# Patient Record
Sex: Female | Born: 1955
Health system: Southern US, Community
[De-identification: ages and names within clinical notes are randomized; demographics above are authoritative.]

## PROBLEM LIST (undated history)

## (undated) DIAGNOSIS — I10 Essential (primary) hypertension: Secondary | ICD-10-CM

## (undated) DIAGNOSIS — B1920 Unspecified viral hepatitis C without hepatic coma: Secondary | ICD-10-CM

## (undated) DIAGNOSIS — M1990 Unspecified osteoarthritis, unspecified site: Secondary | ICD-10-CM

## (undated) DIAGNOSIS — J45909 Unspecified asthma, uncomplicated: Secondary | ICD-10-CM

## (undated) DIAGNOSIS — Z8489 Family history of other specified conditions: Secondary | ICD-10-CM

## (undated) DIAGNOSIS — F319 Bipolar disorder, unspecified: Secondary | ICD-10-CM

## (undated) DIAGNOSIS — I639 Cerebral infarction, unspecified: Secondary | ICD-10-CM

## (undated) DIAGNOSIS — F1020 Alcohol dependence, uncomplicated: Secondary | ICD-10-CM

## (undated) DIAGNOSIS — Z8639 Personal history of other endocrine, nutritional and metabolic disease: Secondary | ICD-10-CM

## (undated) HISTORY — PX: CHOLECYSTECTOMY: SHX55

## (undated) HISTORY — PX: ABDOMINAL HYSTERECTOMY: SHX81

## (undated) SURGERY — Surgical Case
Anesthesia: *Unknown

---

## 2013-07-05 ENCOUNTER — Emergency Department (HOSPITAL_COMMUNITY)
Admission: EM | Admit: 2013-07-05 | Discharge: 2013-07-05 | Disposition: A | Discharge status: Home / Self Care | Payer: Self-pay | Attending: Emergency Medicine | Admitting: Emergency Medicine

## 2013-07-05 ENCOUNTER — Encounter (HOSPITAL_COMMUNITY): Payer: Self-pay | Admitting: Family Medicine

## 2013-07-05 DIAGNOSIS — F17200 Nicotine dependence, unspecified, uncomplicated: Secondary | ICD-10-CM | POA: Insufficient documentation

## 2013-07-05 DIAGNOSIS — K047 Periapical abscess without sinus: Secondary | ICD-10-CM | POA: Insufficient documentation

## 2013-07-05 DIAGNOSIS — K0381 Cracked tooth: Secondary | ICD-10-CM | POA: Insufficient documentation

## 2013-07-05 DIAGNOSIS — K029 Dental caries, unspecified: Secondary | ICD-10-CM | POA: Insufficient documentation

## 2013-07-05 MED ORDER — OXYCODONE-ACETAMINOPHEN 5-325 MG PO TABS: 1.0000 | ORAL_TABLET | ORAL | Status: DC | PRN

## 2013-07-05 MED ORDER — CLINDAMYCIN HCL 150 MG PO CAPS
300.0000 mg | ORAL_CAPSULE | Freq: Once | ORAL | Status: AC
  Administered 2013-07-05: 300 mg via ORAL
  Filled 2013-07-05: qty 2

## 2013-07-05 MED ORDER — CLINDAMYCIN HCL 150 MG PO CAPS: 300.0000 mg | ORAL_CAPSULE | Freq: Four times a day (QID) | ORAL | Status: DC

## 2013-07-05 MED ORDER — OXYCODONE-ACETAMINOPHEN 5-325 MG PO TABS
1.0000 | ORAL_TABLET | Freq: Once | ORAL | Status: AC
  Administered 2013-07-05: 1 via ORAL
  Filled 2013-07-05: qty 1

## 2013-07-05 NOTE — ED Notes (Signed)
Per pt sts dental pain and facial swelling on the right side since she woke up this am.

## 2013-07-05 NOTE — ED Provider Notes (Signed)
   History    CSN: [id number] Arrival date & time 07/05/13  1416  First MD Initiated Contact with Patient 07/05/13 1429     Chief Complaint  Patient presents with  . Dental Pain   (Consider location/radiation/quality/duration/timing/severity/associated sxs/prior Treatment) HPI Diane Cook is a 57 y.o. female who presents to ED with complaint of a dental pain. States pain started 2 days ago. She chipped that tooth a year ago, however no problems since. States feels like she has right facial swelling. Taking aspirin with no relief. Denies fever, chills, malaise.  History reviewed. No pertinent past medical history. Past Surgical History  Procedure Laterality Date  . Abdominal hysterectomy    . Cholecystectomy     History reviewed. No pertinent family history. History  Substance Use Topics  . Smoking status: Current Every Day Smoker  . Smokeless tobacco: Not on file  . Alcohol Use: No   OB History   Grav Para Term Preterm Abortions TAB SAB Ect Mult Living                 Review of Systems  Constitutional: Negative for fever and chills.  HENT: Positive for dental problem. Negative for ear pain, sore throat, trouble swallowing, neck pain and neck stiffness.   Neurological: Negative for weakness and headaches.    Allergies  Review of patient's allergies indicates no known allergies.  Home Medications  No current outpatient prescriptions on file. BP 149/84  Temp(Src) 98.3 F (36.8 C)  Resp 18  SpO2 100% Physical Exam  Nursing note and vitals reviewed. Constitutional: She appears well-developed and well-nourished. No distress.  HENT:  Head: Normocephalic.  Right lower 1st molar partially chipped off, carries present, surrounding gum abscess. No obvious facial swelling. No swelling under the tongue.   Eyes: Conjunctivae are normal.  Neck: Neck supple.  Musculoskeletal: She exhibits no edema.  Skin: Skin is warm and dry.    ED Course  Procedures (including critical  care time) Labs Reviewed - No data to display No results found.  1. Dental abscess     MDM  Pt with a dental abscess. Will start on clindamycin, percocet for pain. Follow up with oral surgery. No signs of sepsis or ludwigs angina. Pt non toxic.   Filed Vitals:   07/05/13 1427 07/05/13 1447  BP: 149/84 158/89  Pulse:  98  Temp: 98.3 F (36.8 C)   Resp: 18   SpO2: 100% 100%     Lottie Mussel, PA-C 07/06/13 1924

## 2013-07-05 NOTE — ED Notes (Signed)
Right lower molar chipped. Now has pain and swelling.

## 2013-07-11 NOTE — ED Provider Notes (Signed)
Medical screening examination/treatment/procedure(s) were performed by non-physician practitioner and as supervising physician I was immediately available for consultation/collaboration.   Desmund Elman III, MD 07/11/13 1210 

## 2013-11-18 ENCOUNTER — Encounter (HOSPITAL_COMMUNITY): Payer: Self-pay | Admitting: Emergency Medicine

## 2013-11-18 ENCOUNTER — Emergency Department (HOSPITAL_COMMUNITY)
Admission: EM | Admit: 2013-11-18 | Discharge: 2013-11-18 | Disposition: A | Discharge status: Home / Self Care | Payer: Self-pay | Attending: Emergency Medicine | Admitting: Emergency Medicine

## 2013-11-18 DIAGNOSIS — J45909 Unspecified asthma, uncomplicated: Secondary | ICD-10-CM | POA: Insufficient documentation

## 2013-11-18 DIAGNOSIS — Z8739 Personal history of other diseases of the musculoskeletal system and connective tissue: Secondary | ICD-10-CM | POA: Insufficient documentation

## 2013-11-18 DIAGNOSIS — F17200 Nicotine dependence, unspecified, uncomplicated: Secondary | ICD-10-CM | POA: Insufficient documentation

## 2013-11-18 DIAGNOSIS — J069 Acute upper respiratory infection, unspecified: Secondary | ICD-10-CM | POA: Insufficient documentation

## 2013-11-18 DIAGNOSIS — Z8673 Personal history of transient ischemic attack (TIA), and cerebral infarction without residual deficits: Secondary | ICD-10-CM | POA: Insufficient documentation

## 2013-11-18 DIAGNOSIS — I10 Essential (primary) hypertension: Secondary | ICD-10-CM | POA: Insufficient documentation

## 2013-11-18 HISTORY — DX: Unspecified osteoarthritis, unspecified site: M19.90

## 2013-11-18 HISTORY — DX: Cerebral infarction, unspecified: I63.9

## 2013-11-18 HISTORY — DX: Essential (primary) hypertension: I10

## 2013-11-18 HISTORY — DX: Unspecified asthma, uncomplicated: J45.909

## 2013-11-18 MED ORDER — ALBUTEROL SULFATE HFA 108 (90 BASE) MCG/ACT IN AERS
2.0000 | INHALATION_SPRAY | Freq: Once | RESPIRATORY_TRACT | Status: AC
  Administered 2013-11-18: 2 via RESPIRATORY_TRACT
  Filled 2013-11-18: qty 6.7

## 2013-11-18 MED ORDER — FLUTICASONE PROPIONATE 50 MCG/ACT NA SUSP: 2.0000 | Freq: Every day | NASAL | Status: AC

## 2013-11-18 NOTE — ED Notes (Signed)
Pt states she's had a cough with sore throat for 2 weeks.  Pt states cough is not productive.

## 2013-11-18 NOTE — ED Provider Notes (Signed)
CSN: [id number]     Arrival date & time 11/18/13  1606 History  This chart was scribed for non-physician practitioner Johnnette Gourd, PA-C, working with Hurman Horn, MD by Dorothey Baseman, ED Scribe. This patient was seen in room TR06C/TR06C and the patient's care was started at 5:46 PM.    Chief Complaint  Patient presents with  . Cough  . Sore Throat   The history is provided by the patient. No language interpreter was used.   HPI Comments: Diane Cook is a 57 y.o. female with a history of asthma who presents to the Emergency Department complaining of a dry cough with associated sore throat, congestion, and subjective low-grade fever onset 2 weeks ago. Patient reports that she ran out of her albuterol inhaler about 2 weeks ago. She denies nausea, emesis, chest pain, or recent exacerbation of shortness of breath. Patient also reports a history of HTN.   Past Medical History  Diagnosis Date  . Hypertension   . Arthritis   . Stroke   . Asthma    Past Surgical History  Procedure Laterality Date  . Abdominal hysterectomy    . Cholecystectomy     No family history on file. History  Substance Use Topics  . Smoking status: Current Every Day Smoker  . Smokeless tobacco: Not on file  . Alcohol Use: No   OB History   Grav Para Term Preterm Abortions TAB SAB Ect Mult Living                 Review of Systems  A complete 10 system review of systems was obtained and all systems are negative except as noted in the HPI and PMH.   Allergies  Review of patient's allergies indicates no known allergies.  Home Medications   Current Outpatient Rx  Name  Route  Sig  Dispense  Refill  . clindamycin (CLEOCIN) 150 MG capsule   Oral   Take 2 capsules (300 mg total) by mouth every 6 (six) hours.   42 capsule   0   . oxyCODONE-acetaminophen (PERCOCET) 5-325 MG per tablet   Oral   Take 1 tablet by mouth every 4 (four) hours as needed for pain.   20 tablet   0    Triage Vitals: BP 155/75   Pulse 97  Temp(Src) 98.1 F (36.7 C) (Oral)  Resp 19  Ht 6' (1.829 m)  Wt 327 lb 1 oz (148.355 kg)  BMI 44.35 kg/m2  SpO2 98%  Physical Exam  Nursing note and vitals reviewed. Constitutional: She is oriented to person, place, and time. She appears well-developed and well-nourished. No distress.  HENT:  Head: Normocephalic and atraumatic.  Mouth/Throat: Oropharynx is clear and moist. No oropharyngeal exudate.  Nasal mucosa edema with postnasal drip and congestion. Erythematous pharynx without edema or exudate.   Eyes: Conjunctivae and EOM are normal.  Neck: Normal range of motion. Neck supple.  Cardiovascular: Normal rate, regular rhythm, normal heart sounds and intact distal pulses.  Exam reveals no gallop and no friction rub.   No murmur heard. Pulmonary/Chest: Effort normal and breath sounds normal. No respiratory distress. She has no wheezes. She has no rales.  Musculoskeletal: Normal range of motion. She exhibits no edema.  Neurological: She is alert and oriented to person, place, and time. No sensory deficit.  Skin: Skin is warm and dry.  Psychiatric: She has a normal mood and affect. Her behavior is normal.    ED Course  Procedures (including critical care time)  DIAGNOSTIC STUDIES: Oxygen Saturation is 98% on room air, normal by my interpretation.    COORDINATION OF CARE: 5:48 PM- Will discharge patient with an albuterol inhaler and and a nasal spray. Discussed treatment plan with patient at bedside and patient verbalized agreement.     Labs Review Labs Reviewed - No data to display Imaging Review No results found.  EKG Interpretation   None       MDM   1. Asthma   2. URI (upper respiratory infection)    Lungs clear, normal vitals. She is in NAD, O2 sat 98% on RA. Return precautions given. Patient states understanding of treatment care plan and is agreeable.   I personally performed the services described in this documentation, which was scribed in my  presence. The recorded information has been reviewed and is accurate.     Trevor Mace, PA-C 11/18/13 1800

## 2013-11-18 NOTE — ED Notes (Signed)
States she has been out of her albuterol inhaler for 2 weeks

## 2013-11-19 NOTE — ED Provider Notes (Signed)
Medical screening examination/treatment/procedure(s) were performed by non-physician practitioner and as supervising physician I was immediately available for consultation/collaboration.  Hurman Horn, MD 11/19/13 [phone]

## 2015-04-15 ENCOUNTER — Encounter (HOSPITAL_COMMUNITY): Payer: Self-pay | Admitting: *Deleted

## 2015-04-15 ENCOUNTER — Emergency Department (HOSPITAL_COMMUNITY)
Admission: EM | Admit: 2015-04-15 | Discharge: 2015-04-15 | Disposition: A | Discharge status: Home / Self Care | Payer: No Typology Code available for payment source | Attending: Emergency Medicine | Admitting: Emergency Medicine

## 2015-04-15 ENCOUNTER — Emergency Department (HOSPITAL_COMMUNITY): Payer: No Typology Code available for payment source

## 2015-04-15 DIAGNOSIS — Z8673 Personal history of transient ischemic attack (TIA), and cerebral infarction without residual deficits: Secondary | ICD-10-CM | POA: Insufficient documentation

## 2015-04-15 DIAGNOSIS — S79912A Unspecified injury of left hip, initial encounter: Secondary | ICD-10-CM | POA: Insufficient documentation

## 2015-04-15 DIAGNOSIS — I10 Essential (primary) hypertension: Secondary | ICD-10-CM | POA: Insufficient documentation

## 2015-04-15 DIAGNOSIS — Z7951 Long term (current) use of inhaled steroids: Secondary | ICD-10-CM | POA: Diagnosis not present

## 2015-04-15 DIAGNOSIS — M1990 Unspecified osteoarthritis, unspecified site: Secondary | ICD-10-CM | POA: Diagnosis not present

## 2015-04-15 DIAGNOSIS — E669 Obesity, unspecified: Secondary | ICD-10-CM | POA: Insufficient documentation

## 2015-04-15 DIAGNOSIS — Z8639 Personal history of other endocrine, nutritional and metabolic disease: Secondary | ICD-10-CM | POA: Diagnosis not present

## 2015-04-15 DIAGNOSIS — Y9389 Activity, other specified: Secondary | ICD-10-CM | POA: Insufficient documentation

## 2015-04-15 DIAGNOSIS — Z720 Tobacco use: Secondary | ICD-10-CM | POA: Insufficient documentation

## 2015-04-15 DIAGNOSIS — Y998 Other external cause status: Secondary | ICD-10-CM | POA: Diagnosis not present

## 2015-04-15 DIAGNOSIS — Z79899 Other long term (current) drug therapy: Secondary | ICD-10-CM | POA: Diagnosis not present

## 2015-04-15 DIAGNOSIS — Y92410 Unspecified street and highway as the place of occurrence of the external cause: Secondary | ICD-10-CM | POA: Diagnosis not present

## 2015-04-15 DIAGNOSIS — J45909 Unspecified asthma, uncomplicated: Secondary | ICD-10-CM | POA: Diagnosis not present

## 2015-04-15 DIAGNOSIS — M25552 Pain in left hip: Secondary | ICD-10-CM

## 2015-04-15 HISTORY — DX: Personal history of other endocrine, nutritional and metabolic disease: Z86.39

## 2015-04-15 MED ORDER — NAPROXEN 500 MG PO TABS: 500.0000 mg | ORAL_TABLET | Freq: Two times a day (BID) | ORAL | Status: DC | PRN

## 2015-04-15 MED ORDER — HYDROCODONE-ACETAMINOPHEN 5-325 MG PO TABS
1.0000 | ORAL_TABLET | Freq: Once | ORAL | Status: AC
  Administered 2015-04-15: 1 via ORAL
  Filled 2015-04-15: qty 1

## 2015-04-15 MED ORDER — IBUPROFEN 800 MG PO TABS
800.0000 mg | ORAL_TABLET | Freq: Once | ORAL | Status: AC
  Administered 2015-04-15: 800 mg via ORAL
  Filled 2015-04-15: qty 1

## 2015-04-15 NOTE — ED Notes (Signed)
Pt off unit with xray 
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
## 2015-04-15 NOTE — Discharge Instructions (Signed)
Follow up with ortho if no improvement in 1 week. If you were given medicines take as directed.  If you are on coumadin or contraceptives realize their levels and effectiveness is altered by many different medicines.  If you have any reaction (rash, tongues swelling, other) to the medicines stop taking and see a physician.   Please follow up as directed and return to the ER or see a physician for new or worsening symptoms.  Thank you. Filed Vitals:   04/15/15 1021 04/15/15 1030 04/15/15 1130 04/15/15 1145  BP:  100/49 90/52 109/59  Pulse: 95 94 89 87  Temp:      TempSrc:      Resp: 9 0    Height:      Weight:      SpO2: 96% 96% 98% 99%

## 2015-04-23 ENCOUNTER — Encounter (HOSPITAL_COMMUNITY): Payer: Self-pay

## 2015-04-23 ENCOUNTER — Emergency Department (HOSPITAL_COMMUNITY): Payer: Medicaid Other

## 2015-04-23 ENCOUNTER — Inpatient Hospital Stay (HOSPITAL_COMMUNITY)
Admission: EM | Admit: 2015-04-23 | Discharge: 2015-04-26 | DRG: 292 | Disposition: A | Discharge status: Home / Self Care | Payer: Medicaid Other | Attending: Internal Medicine | Admitting: Internal Medicine

## 2015-04-23 DIAGNOSIS — E538 Deficiency of other specified B group vitamins: Secondary | ICD-10-CM | POA: Diagnosis present

## 2015-04-23 DIAGNOSIS — F319 Bipolar disorder, unspecified: Secondary | ICD-10-CM | POA: Diagnosis present

## 2015-04-23 DIAGNOSIS — Z6841 Body Mass Index (BMI) 40.0 and over, adult: Secondary | ICD-10-CM

## 2015-04-23 DIAGNOSIS — D689 Coagulation defect, unspecified: Secondary | ICD-10-CM | POA: Diagnosis present

## 2015-04-23 DIAGNOSIS — K7031 Alcoholic cirrhosis of liver with ascites: Secondary | ICD-10-CM | POA: Diagnosis present

## 2015-04-23 DIAGNOSIS — Z8673 Personal history of transient ischemic attack (TIA), and cerebral infarction without residual deficits: Secondary | ICD-10-CM | POA: Diagnosis not present

## 2015-04-23 DIAGNOSIS — I5031 Acute diastolic (congestive) heart failure: Principal | ICD-10-CM | POA: Diagnosis present

## 2015-04-23 DIAGNOSIS — D649 Anemia, unspecified: Secondary | ICD-10-CM | POA: Diagnosis present

## 2015-04-23 DIAGNOSIS — D6959 Other secondary thrombocytopenia: Secondary | ICD-10-CM | POA: Diagnosis present

## 2015-04-23 DIAGNOSIS — D696 Thrombocytopenia, unspecified: Secondary | ICD-10-CM | POA: Diagnosis present

## 2015-04-23 DIAGNOSIS — J810 Acute pulmonary edema: Secondary | ICD-10-CM

## 2015-04-23 DIAGNOSIS — R7989 Other specified abnormal findings of blood chemistry: Secondary | ICD-10-CM

## 2015-04-23 DIAGNOSIS — J45909 Unspecified asthma, uncomplicated: Secondary | ICD-10-CM | POA: Diagnosis present

## 2015-04-23 DIAGNOSIS — R0602 Shortness of breath: Secondary | ICD-10-CM | POA: Diagnosis present

## 2015-04-23 DIAGNOSIS — Z90710 Acquired absence of both cervix and uterus: Secondary | ICD-10-CM

## 2015-04-23 DIAGNOSIS — I252 Old myocardial infarction: Secondary | ICD-10-CM | POA: Diagnosis not present

## 2015-04-23 DIAGNOSIS — K7460 Unspecified cirrhosis of liver: Secondary | ICD-10-CM | POA: Diagnosis present

## 2015-04-23 DIAGNOSIS — F1020 Alcohol dependence, uncomplicated: Secondary | ICD-10-CM | POA: Diagnosis present

## 2015-04-23 DIAGNOSIS — F17210 Nicotine dependence, cigarettes, uncomplicated: Secondary | ICD-10-CM | POA: Diagnosis present

## 2015-04-23 DIAGNOSIS — B1920 Unspecified viral hepatitis C without hepatic coma: Secondary | ICD-10-CM | POA: Diagnosis present

## 2015-04-23 DIAGNOSIS — E8770 Fluid overload, unspecified: Secondary | ICD-10-CM

## 2015-04-23 DIAGNOSIS — D539 Nutritional anemia, unspecified: Secondary | ICD-10-CM | POA: Diagnosis present

## 2015-04-23 DIAGNOSIS — I509 Heart failure, unspecified: Secondary | ICD-10-CM

## 2015-04-23 DIAGNOSIS — Z9049 Acquired absence of other specified parts of digestive tract: Secondary | ICD-10-CM | POA: Diagnosis present

## 2015-04-23 DIAGNOSIS — I10 Essential (primary) hypertension: Secondary | ICD-10-CM | POA: Diagnosis present

## 2015-04-23 DIAGNOSIS — E669 Obesity, unspecified: Secondary | ICD-10-CM

## 2015-04-23 DIAGNOSIS — M1990 Unspecified osteoarthritis, unspecified site: Secondary | ICD-10-CM | POA: Diagnosis present

## 2015-04-23 DIAGNOSIS — F1010 Alcohol abuse, uncomplicated: Secondary | ICD-10-CM | POA: Diagnosis not present

## 2015-04-23 DIAGNOSIS — J811 Chronic pulmonary edema: Secondary | ICD-10-CM | POA: Insufficient documentation

## 2015-04-23 DIAGNOSIS — R945 Abnormal results of liver function studies: Secondary | ICD-10-CM

## 2015-04-23 HISTORY — DX: Bipolar disorder, unspecified: F31.9

## 2015-04-23 HISTORY — DX: Unspecified viral hepatitis C without hepatic coma: B19.20

## 2015-04-23 HISTORY — DX: Alcohol dependence, uncomplicated: F10.20

## 2015-04-23 HISTORY — DX: Family history of other specified conditions: Z84.89

## 2015-04-23 LAB — BASIC METABOLIC PANEL
Anion gap: 7 (ref 5–15)
BUN: 7 mg/dL (ref 6–23)
CALCIUM: 7.5 mg/dL — AB (ref 8.4–10.5)
CHLORIDE: 108 mmol/L (ref 96–112)
CO2: 23 mmol/L (ref 19–32)
Creatinine, Ser: 0.85 mg/dL (ref 0.50–1.10)
GFR, EST AFRICAN AMERICAN: 85 mL/min — AB (ref >90)
GFR, EST NON AFRICAN AMERICAN: 73 mL/min — AB (ref >90)
GLUCOSE: 106 mg/dL — AB (ref 70–99)
POTASSIUM: 3.7 mmol/L (ref 3.5–5.1)
Sodium: 138 mmol/L (ref 135–145)

## 2015-04-23 LAB — RAPID URINE DRUG SCREEN, HOSP PERFORMED
Amphetamines: NOT DETECTED
Barbiturates: NOT DETECTED
Benzodiazepines: NOT DETECTED
COCAINE: NOT DETECTED
OPIATES: NOT DETECTED
Tetrahydrocannabinol: NOT DETECTED

## 2015-04-23 LAB — CBC
HEMATOCRIT: 24.7 % — AB (ref 36.0–46.0)
Hemoglobin: 8.2 g/dL — ABNORMAL LOW (ref 12.0–15.0)
MCH: 37.3 pg — ABNORMAL HIGH (ref 26.0–34.0)
MCHC: 33.2 g/dL (ref 30.0–36.0)
MCV: 112.3 fL — ABNORMAL HIGH (ref 78.0–100.0)
Platelets: 59 10*3/uL — ABNORMAL LOW (ref 150–400)
RBC: 2.2 MIL/uL — ABNORMAL LOW (ref 3.87–5.11)
RDW: 21.8 % — ABNORMAL HIGH (ref 11.5–15.5)
WBC: 5.6 10*3/uL (ref 4.0–10.5)

## 2015-04-23 LAB — I-STAT TROPONIN, ED: TROPONIN I, POC: 0.01 ng/mL (ref 0.00–0.08)

## 2015-04-23 LAB — BRAIN NATRIURETIC PEPTIDE: B NATRIURETIC PEPTIDE 5: 100.3 pg/mL — AB (ref 0.0–100.0)

## 2015-04-23 LAB — ETHANOL: ALCOHOL ETHYL (B): 16 mg/dL — AB (ref 0–9)

## 2015-04-23 MED ORDER — SODIUM CHLORIDE 0.9 % IJ SOLN
3.0000 mL | Freq: Two times a day (BID) | INTRAMUSCULAR | Status: DC
  Administered 2015-04-24 – 2015-04-26 (×6): 3 mL via INTRAVENOUS

## 2015-04-23 MED ORDER — LORAZEPAM 1 MG PO TABS: 1.0000 mg | ORAL_TABLET | Freq: Four times a day (QID) | ORAL | Status: DC | PRN

## 2015-04-23 MED ORDER — ALBUTEROL SULFATE HFA 108 (90 BASE) MCG/ACT IN AERS: 2.0000 | INHALATION_SPRAY | Freq: Four times a day (QID) | RESPIRATORY_TRACT | Status: DC | PRN

## 2015-04-23 MED ORDER — ADULT MULTIVITAMIN W/MINERALS CH
1.0000 | ORAL_TABLET | Freq: Every day | ORAL | Status: DC
  Administered 2015-04-24 – 2015-04-26 (×3): 1 via ORAL
  Filled 2015-04-23 (×3): qty 1

## 2015-04-23 MED ORDER — THIAMINE HCL 100 MG/ML IJ SOLN
100.0000 mg | Freq: Every day | INTRAMUSCULAR | Status: DC
  Filled 2015-04-23: qty 1

## 2015-04-23 MED ORDER — ONDANSETRON HCL 4 MG/2ML IJ SOLN: 4.0000 mg | Freq: Four times a day (QID) | INTRAMUSCULAR | Status: DC | PRN

## 2015-04-23 MED ORDER — VITAMIN B-1 100 MG PO TABS
100.0000 mg | ORAL_TABLET | Freq: Every day | ORAL | Status: DC
  Administered 2015-04-24 – 2015-04-26 (×3): 100 mg via ORAL
  Filled 2015-04-23 (×3): qty 1

## 2015-04-23 MED ORDER — LORAZEPAM 2 MG/ML IJ SOLN: 1.0000 mg | Freq: Four times a day (QID) | INTRAMUSCULAR | Status: DC | PRN

## 2015-04-23 MED ORDER — LISINOPRIL 2.5 MG PO TABS
2.5000 mg | ORAL_TABLET | Freq: Every day | ORAL | Status: DC
  Administered 2015-04-24: 2.5 mg via ORAL
  Filled 2015-04-23: qty 1

## 2015-04-23 MED ORDER — FUROSEMIDE 20 MG PO TABS: 40.0000 mg | ORAL_TABLET | Freq: Once | ORAL | Status: DC

## 2015-04-23 MED ORDER — FUROSEMIDE 10 MG/ML IJ SOLN
40.0000 mg | Freq: Once | INTRAMUSCULAR | Status: AC
  Administered 2015-04-23: 40 mg via INTRAVENOUS
  Filled 2015-04-23: qty 4

## 2015-04-23 MED ORDER — FUROSEMIDE 10 MG/ML IJ SOLN
40.0000 mg | Freq: Two times a day (BID) | INTRAMUSCULAR | Status: DC
  Administered 2015-04-24 – 2015-04-26 (×5): 40 mg via INTRAVENOUS
  Filled 2015-04-23 (×7): qty 4

## 2015-04-23 MED ORDER — FLUTICASONE PROPIONATE 50 MCG/ACT NA SUSP
2.0000 | Freq: Every day | NASAL | Status: DC
  Administered 2015-04-24 – 2015-04-26 (×3): 2 via NASAL
  Filled 2015-04-23: qty 16

## 2015-04-23 MED ORDER — FOLIC ACID 1 MG PO TABS
1.0000 mg | ORAL_TABLET | Freq: Every day | ORAL | Status: DC
  Administered 2015-04-24 – 2015-04-26 (×3): 1 mg via ORAL
  Filled 2015-04-23 (×3): qty 1

## 2015-04-23 MED ORDER — ONDANSETRON HCL 4 MG PO TABS: 4.0000 mg | ORAL_TABLET | Freq: Four times a day (QID) | ORAL | Status: DC | PRN

## 2015-04-23 NOTE — ED Provider Notes (Signed)
CSN: [id number]     Arrival date & time 04/23/15  1825 History   First MD Initiated Contact with Patient 04/23/15 2020     Chief Complaint  Patient presents with  . Leg Swelling  . Shortness of Breath     (Consider location/radiation/quality/duration/timing/severity/associated sxs/prior Treatment) HPI   This 59 year old female, with a history of hypertension, supposed ACS in the 90s, although the patient is unsure of the details associated with this event, asthma, alcohol abuse, subsequent liver disease, presenting today with dyspnea, edema. Onset 4 days ago, edematous to the bilateral lower extremities, worsening over the last 4 days, described as "bloated." No new medications up and taken for this, although the patient states she thinks she might of been on "water pills" in the 90s. Positive for associated dyspnea on exertion. Negative for associated chest pain, fever, chills, cough, syncope.  Past Medical History  Diagnosis Date  . Hypertension   . Arthritis   . Stroke   . Asthma   . H/O non anemic vitamin B12 deficiency    Past Surgical History  Procedure Laterality Date  . Abdominal hysterectomy    . Cholecystectomy     No family history on file. History  Substance Use Topics  . Smoking status: Current Every Day Smoker  . Smokeless tobacco: Not on file  . Alcohol Use: No   OB History    No data available     Review of Systems  Constitutional: Negative for fever and chills.  HENT: Negative for facial swelling.   Eyes: Negative for photophobia and pain.  Respiratory: Positive for shortness of breath.   Cardiovascular: Positive for leg swelling. Negative for chest pain.  Gastrointestinal: Negative for nausea, vomiting and abdominal pain.  Genitourinary: Negative for dysuria.  Musculoskeletal: Negative for arthralgias.  Skin: Negative for rash and wound.  Neurological: Negative for seizures.  Hematological: Negative for adenopathy.      Allergies  Review of  patient's allergies indicates no known allergies.  Home Medications   Prior to Admission medications   Medication Sig Start Date End Date Taking? Authorizing Provider  albuterol (PROVENTIL HFA;VENTOLIN HFA) 108 (90 BASE) MCG/ACT inhaler Inhale 2 puffs into the lungs every 6 (six) hours as needed for wheezing or shortness of breath.   Yes Historical Provider, MD  fluticasone (FLONASE) 50 MCG/ACT nasal spray Place 2 sprays into both nostrils daily. Patient not taking: Reported on 04/15/2015 11/18/13   Kathrynn Speed, PA-C  naproxen (NAPROSYN) 500 MG tablet Take 1 tablet (500 mg total) by mouth 2 (two) times daily as needed for moderate pain. Patient not taking: Reported on 04/23/2015 04/15/15   Blane Ohara, MD   BP 132/83 mmHg  Pulse 95  Temp(Src) 99.1 F (37.3 C) (Rectal)  Resp 15  Ht 5' 11.5" (1.816 m)  Wt 392 lb 4 oz (177.923 kg)  BMI 53.95 kg/m2  SpO2 95% Physical Exam  Constitutional: She is oriented to person, place, and time. She appears well-developed and well-nourished. No distress.  HENT:  Head: Normocephalic and atraumatic.  Mouth/Throat: No oropharyngeal exudate.  Eyes: Conjunctivae are normal. Pupils are equal, round, and reactive to light. No scleral icterus.  Neck: Normal range of motion. No tracheal deviation present. No thyromegaly present.  Cardiovascular: Normal rate, regular rhythm and normal heart sounds.  Exam reveals no gallop and no friction rub.   No murmur heard. Pulmonary/Chest: Effort normal. No stridor. No respiratory distress. She has no wheezes. She has rales. She exhibits no tenderness.  Abdominal:  Soft. She exhibits no distension and no mass. There is no tenderness. There is no rebound and no guarding.  Musculoskeletal: Normal range of motion. She exhibits edema (BLEs).  Neurological: She is alert and oriented to person, place, and time.  Skin: Skin is warm and dry. She is not diaphoretic.    ED Course  Procedures (including critical care time) Labs  Review Labs Reviewed  CBC - Abnormal; Notable for the following:    RBC 2.20 (*)    Hemoglobin 8.2 (*)    HCT 24.7 (*)    MCV 112.3 (*)    MCH 37.3 (*)    RDW 21.8 (*)    Platelets 59 (*)    All other components within normal limits  BASIC METABOLIC PANEL - Abnormal; Notable for the following:    Glucose, Bld 106 (*)    Calcium 7.5 (*)    GFR calc non Af Amer 73 (*)    GFR calc Af Amer 85 (*)    All other components within normal limits  BRAIN NATRIURETIC PEPTIDE - Abnormal; Notable for the following:    B Natriuretic Peptide 100.3 (*)    All other components within normal limits  ETHANOL - Abnormal; Notable for the following:    Alcohol, Ethyl (B) 16 (*)    All other components within normal limits  URINE RAPID DRUG SCREEN (HOSP PERFORMED)  HEPATIC FUNCTION PANEL  I-STAT TROPOININ, ED    Imaging Review Dg Chest 2 View  04/23/2015   CLINICAL DATA:  Difficulty breathing with lower extremity edema for 4 days  EXAM: CHEST  2 VIEW  COMPARISON:  None.  FINDINGS: There is mild interstitial edema. There is no airspace consolidation or effusion. Heart is mildly enlarged. There is mild pulmonary venous hypertension. No adenopathy. No bone lesions.  IMPRESSION: Findings indicative of a degree of congestive heart failure. No airspace consolidation.   Electronically Signed   By: Bretta BangWilliam  Woodruff III M.D.   On: 04/23/2015 20:23     EKG Interpretation   Date/Time:  Tuesday April 23 2015 18:39:22 EDT Ventricular Rate:  104 PR Interval:  150 QRS Duration: 84 QT Interval:  356 QTC Calculation: 468 R Axis:   26 Text Interpretation:  Sinus tachycardia Low voltage QRS Abnormal ECG No  old tracing to compare Confirmed by Rhunette CroftNANAVATI, MD, Janey GentaANKIT [id number] on  04/23/2015 9:59:02 PM      MDM   Final diagnoses:  Congestive heart failure, unspecified congestive heart failure chronicity, unspecified congestive heart failure type  Hypervolemia, unspecified hypervolemia type  Thrombocytopenia   Anemia, unspecified anemia type  Acute pulmonary edema    This 59 year old female, with a history of hypertension, supposed ACS in the 90s, although the patient is unsure of the details associated with this event, asthma, alcohol abuse, subsequent liver disease, presenting today with dyspnea, edema. Onset 4 days ago, edematous to the bilateral lower extremities, worsening over the last 4 days, described as "bloated." No new medications up and taken for this, although the patient states she thinks she might of been on "water pills" in the 90s. Positive for associated dyspnea on exertion. Negative for associated chest pain, fever, chills, cough, syncope.  On examination, vital signs are within normal limits. Patient's oxygen saturation remained stable with ambulation throughout the hallway. Positive for rales throughout the lung fields. Positive for substantial lower extremity edema bilaterally, pitting. Chest x-ray reveals pulmonary edema. Negative for ischemic changes on EKG. Patient is thrombocytopenic, anemic. I'm not aware of baseline platelet or  hemoglobin. Additionally, patient explicitly denies a history of CHF. If this is a case, patient is suffering from new onset CHF. Will admit to the hospitalist service, under telemetry for further care.  I have discussed case and care has been guided by my attending physician, Dr. Rhunette Croft.  Loma Boston, MD 04/23/15 2310  Derwood Kaplan, MD 04/25/15 [phone]

## 2015-04-23 NOTE — H&P (Signed)
[facility] History and Physical  Patient: Diane Cook  MRN: [id number]  DOB: [birth year]  DOS: the patient was seen and examined on 04/23/2015 PCP: No PCP Per Patient  Referring physician: Loma Boston, MD Chief Complaint: Shortness of breath  HPI: Diane Cook is a 59 y.o. female with Past medical history of asthma. The patient is presenting with complaints of shortness of breath progressively worsening over last 2 weeks. The patient is coming from [place], where she was staying with her husband. Patient recently since last one month [place]. Since last 2 weeks the daughter has noted that the patient has been having progressively worsening shortness of breath. The patient was also complaining of some hip pain and was seen in the hospital. Since her initial presentation for hip pain and today's presentation patient has gained significant weight. Patient denies any compressive chest pain or palpitation or dizziness. Denies any complaints of nausea or vomiting. Denies any complains of diarrhea constipation or active bleeding. Does not have any burning urination. Patient has been using her inhalers on a regular basis. Patient has significant history of heavy alcohol drinking for last many years as well as heavy smoking. Patient has not seen any physician in the last 20 years and has not been in the hospital in last 3 years.  The patient is coming from home. And at her baseline independent for most of her ADL.  Review of Systems: as mentioned in the history of present illness.  A comprehensive review of the other systems is negative.  Past Medical History  Diagnosis Date  . Hypertension   . Arthritis   . Stroke   . Asthma   . H/O non anemic vitamin B12 deficiency   . Hepatitis C   . Alcoholism    Past Surgical History  Procedure Laterality Date  . Abdominal hysterectomy    . Cholecystectomy     Social History:  reports that she  has been smoking Cigarettes.  She has a 60 pack-year smoking history. She does not have any smokeless tobacco history on file. She reports that she drinks about 6.0 - 12.0 oz of alcohol per week. She reports that she does not use illicit drugs.  No Known Allergies  Family History  Problem Relation Age of Onset  . Diabetes Mother   . Diabetes Daughter     Prior to Admission medications   Medication Sig Start Date End Date Taking? Authorizing Provider  albuterol (PROVENTIL HFA;VENTOLIN HFA) 108 (90 BASE) MCG/ACT inhaler Inhale 2 puffs into the lungs every 6 (six) hours as needed for wheezing or shortness of breath.   Yes Historical Provider, MD  fluticasone (FLONASE) 50 MCG/ACT nasal spray Place 2 sprays into both nostrils daily. Patient not taking: Reported on 04/15/2015 11/18/13   Kathrynn Speed, PA-C  naproxen (NAPROSYN) 500 MG tablet Take 1 tablet (500 mg total) by mouth 2 (two) times daily as needed for moderate pain. Patient not taking: Reported on 04/23/2015 04/15/15   Blane Ohara, MD    Physical Exam: Filed Vitals:   04/23/15 2013 04/23/15 2100 04/23/15 2202 04/23/15 2300  BP: 130/53 123/75 132/83 131/65  Pulse: 92 98 95 99  Temp: 98.4 F (36.9 C)  99.1 F (37.3 C)   TempSrc: Oral  Rectal   Resp: Height:      Weight:      SpO2: 98% 97% 95% 99%    General: Alert, Awake and Oriented to  Time, Place and Person. Appear in mild distress Eyes: PERRL ENT: Oral Mucosa clear moist. Neck: Difficult to assess JVD Cardiovascular: S1 and S2 Present, no Murmur, Peripheral Pulses Present Respiratory: Bilateral Air entry equal and Decreased,  Clear to Auscultation, no Crackles, no wheezes Abdomen: Bowel Sound present, Soft and non tender Skin: no Rash Extremities: Bilateral Pedal edema, no calf tenderness Neurologic: Grossly no focal neuro deficit.  Labs on Admission:  CBC:  Recent Labs Lab 04/23/15 1836  WBC 5.6  HGB 8.2*  HCT 24.7*  MCV 112.3*  PLT 59*     CMP     Component Value Date/Time   NA 138 04/23/2015 1836   K 3.7 04/23/2015 1836   CL 108 04/23/2015 1836   CO2 23 04/23/2015 1836   GLUCOSE 106* 04/23/2015 1836   BUN 7 04/23/2015 1836   CREATININE 0.85 04/23/2015 1836   CALCIUM 7.5* 04/23/2015 1836   GFRNONAA 73* 04/23/2015 1836   GFRAA 85* 04/23/2015 1836    No results for input(s): LIPASE, AMYLASE in the last 168 hours.  No results for input(s): CKTOTAL, CKMB, CKMBINDEX, TROPONINI in the last 168 hours. BNP (last 3 results)  Recent Labs  04/23/15 1836  BNP 100.3*    ProBNP (last 3 results) No results for input(s): PROBNP in the last 8760 hours.   Radiological Exams on Admission: Dg Chest 2 View  04/23/2015   CLINICAL DATA:  Difficulty breathing with lower extremity edema for 4 days  EXAM: CHEST  2 VIEW  COMPARISON:  None.  FINDINGS: There is mild interstitial edema. There is no airspace consolidation or effusion. Heart is mildly enlarged. There is mild pulmonary venous hypertension. No adenopathy. No bone lesions.  IMPRESSION: Findings indicative of a degree of congestive heart failure. No airspace consolidation.   Electronically Signed   By: Bretta Bang III M.D.   On: 04/23/2015 20:23   EKG: Independently reviewed. normal sinus rhythm, nonspecific ST and T waves changes.  Assessment/Plan Principal Problem:   Acute CHF (congestive heart failure) Active Problems:   Alcohol abuse   suspected Liver cirrhosis   Coagulopathy   Anemia   Thrombocytopenia   Hypomagnesemia   Obesity   1. Acute CHF (congestive heart failure) The patient is presenting with complaints of progressively worsening shortness of breath. She has orthopnea. Chest x-ray shows congestion. EKG does not show any evidence of acute ischemia. Initial troponin is negative. Most likely has CHF is secondary to liver cirrhosis which could be secondary to hepatitis C as well as alcohol abuse. At present she will be admitted in the  hospital. We will monitor her on telemetry. Continue with Lasix 40 mg every 12 hours. Echocardiogram in the morning. Monitor BMP. We will add also add in the synovial.  2. Suspected liver cirrhosis, alcohol abuse, hepatitis C. The patient has elevated LFT, she does not have any abdominal tenderness but has significant distention and appears to be having anasarca. She also appears to have significant coagulopathy. With this I would give her oral vitamin K. I will get an ultrasound of her abdomen. She may require ultrasound that her paracentesis if she has any ascites. Would require GI follow-up as an outpatient for further workup related to her liver disease. Patient was recommended to stop drinking alcohol.  For alcohol abuse she will be placed on alcohol withdrawal protocol.  3. Anemia and thrombocytopenia with coagulopathy. Patient appears to have significant anemia as well as thrombocytopenia which is most likely secondary to liver cirrhosis. INR is  more than 2. I would give her oral vitamin K. Patient denies any history of active bleeding. Would check FOBT. Anemia panel currently pending. We will give Protonix every 12 hours. Patient remains nothing by mouth. Monitor H&H and reticulocyte count.  4. Hypomagnesemia. Currently replacing.  Advance goals of care discussion: Full code   DVT Prophylaxis: mechanical compression device  Nutrition: Nothing by mouth except medications  Family Communication: family was present at bedside, opportunity was given to ask question and all questions were answered satisfactorily at the time of interview. Disposition: Admitted as oinpatient, telemetry unit.  Author: Lynden OxfordPranav Satrina Magallanes, MD [facility] Pager: [phone] 04/23/2015  If 7PM-7AM, please contact night-coverage [email or web address] Password [email or web address]

## 2015-04-23 NOTE — ED Notes (Signed)
Pt here with spouse that states she started having swelling to her legs and has gone up her body to her breast over the past 4 days. Pt has been having SOB with exertion and at rest. No hx of CHF.

## 2015-04-23 NOTE — ED Notes (Addendum)
Pt ambulated well in the hall from the bathroom O2 was 99% while ambulating.

## 2015-04-24 ENCOUNTER — Inpatient Hospital Stay (HOSPITAL_COMMUNITY): Payer: Medicaid Other

## 2015-04-24 ENCOUNTER — Observation Stay (HOSPITAL_COMMUNITY): Payer: Medicaid Other

## 2015-04-24 ENCOUNTER — Encounter (HOSPITAL_COMMUNITY): Payer: Self-pay | Admitting: General Practice

## 2015-04-24 DIAGNOSIS — K7460 Unspecified cirrhosis of liver: Secondary | ICD-10-CM | POA: Diagnosis present

## 2015-04-24 DIAGNOSIS — D696 Thrombocytopenia, unspecified: Secondary | ICD-10-CM | POA: Diagnosis present

## 2015-04-24 DIAGNOSIS — D689 Coagulation defect, unspecified: Secondary | ICD-10-CM | POA: Diagnosis present

## 2015-04-24 DIAGNOSIS — F1010 Alcohol abuse, uncomplicated: Secondary | ICD-10-CM | POA: Diagnosis present

## 2015-04-24 DIAGNOSIS — R0602 Shortness of breath: Secondary | ICD-10-CM

## 2015-04-24 DIAGNOSIS — R609 Edema, unspecified: Secondary | ICD-10-CM

## 2015-04-24 DIAGNOSIS — D649 Anemia, unspecified: Secondary | ICD-10-CM | POA: Diagnosis present

## 2015-04-24 LAB — CBC WITH DIFFERENTIAL/PLATELET
BASOS ABS: 0.1 10*3/uL (ref 0.0–0.1)
BASOS PCT: 1 % (ref 0–1)
Basophils Absolute: 0.1 10*3/uL (ref 0.0–0.1)
Basophils Relative: 1 % (ref 0–1)
EOS ABS: 0.3 10*3/uL (ref 0.0–0.7)
Eosinophils Absolute: 0.4 10*3/uL (ref 0.0–0.7)
Eosinophils Relative: 6 % — ABNORMAL HIGH (ref 0–5)
Eosinophils Relative: 7 % — ABNORMAL HIGH (ref 0–5)
HCT: 23 % — ABNORMAL LOW (ref 36.0–46.0)
HEMATOCRIT: 24.4 % — AB (ref 36.0–46.0)
HEMOGLOBIN: 7.9 g/dL — AB (ref 12.0–15.0)
Hemoglobin: 8.2 g/dL — ABNORMAL LOW (ref 12.0–15.0)
LYMPHS PCT: 42 % (ref 12–46)
LYMPHS PCT: 44 % (ref 12–46)
Lymphs Abs: 2.4 10*3/uL (ref 0.7–4.0)
Lymphs Abs: 2.3 10*3/uL (ref 0.7–4.0)
MCH: 37.6 pg — AB (ref 26.0–34.0)
MCH: 37.8 pg — ABNORMAL HIGH (ref 26.0–34.0)
MCHC: 34.3 g/dL (ref 30.0–36.0)
MCHC: 33.6 g/dL (ref 30.0–36.0)
MCV: 109.5 fL — ABNORMAL HIGH (ref 78.0–100.0)
MCV: 112.4 fL — ABNORMAL HIGH (ref 78.0–100.0)
MONO ABS: 0.7 10*3/uL (ref 0.1–1.0)
MONOS PCT: 10 % (ref 3–12)
Monocytes Absolute: 0.5 10*3/uL (ref 0.1–1.0)
Monocytes Relative: 12 % (ref 3–12)
NEUTROS ABS: 2.1 10*3/uL (ref 1.7–7.7)
NEUTROS PCT: 39 % — AB (ref 43–77)
NEUTROS PCT: 38 % — AB (ref 43–77)
Neutro Abs: 2.2 10*3/uL (ref 1.7–7.7)
PLATELETS: 65 10*3/uL — AB (ref 150–400)
PLATELETS: 56 10*3/uL — AB (ref 150–400)
RBC: 2.1 MIL/uL — ABNORMAL LOW (ref 3.87–5.11)
RBC: 2.17 MIL/uL — ABNORMAL LOW (ref 3.87–5.11)
RDW: 21.8 % — ABNORMAL HIGH (ref 11.5–15.5)
RDW: 21.7 % — AB (ref 11.5–15.5)
WBC: 5.7 10*3/uL (ref 4.0–10.5)
WBC: 5.4 10*3/uL (ref 4.0–10.5)

## 2015-04-24 LAB — COMPREHENSIVE METABOLIC PANEL
ALBUMIN: 1.3 g/dL — AB (ref 3.5–5.2)
ALK PHOS: 102 U/L (ref 39–117)
ALT: 43 U/L — ABNORMAL HIGH (ref 0–35)
ALT: 43 U/L — ABNORMAL HIGH (ref 0–35)
ANION GAP: 4 — AB (ref 5–15)
ANION GAP: 4 — AB (ref 5–15)
AST: 104 U/L — ABNORMAL HIGH (ref 0–37)
AST: 107 U/L — AB (ref 0–37)
Albumin: 1.3 g/dL — ABNORMAL LOW (ref 3.5–5.2)
Alkaline Phosphatase: 115 U/L (ref 39–117)
BILIRUBIN TOTAL: 6.3 mg/dL — AB (ref 0.3–1.2)
BUN: 7 mg/dL (ref 6–23)
BUN: 8 mg/dL (ref 6–23)
CALCIUM: 7.5 mg/dL — AB (ref 8.4–10.5)
CHLORIDE: 107 mmol/L (ref 96–112)
CO2: 24 mmol/L (ref 19–32)
CO2: 26 mmol/L (ref 19–32)
CREATININE: 0.83 mg/dL (ref 0.50–1.10)
CREATININE: 1 mg/dL (ref 0.50–1.10)
Calcium: 7.6 mg/dL — ABNORMAL LOW (ref 8.4–10.5)
Chloride: 108 mmol/L (ref 96–112)
GFR calc Af Amer: 87 mL/min — ABNORMAL LOW (ref >90)
GFR calc Af Amer: 70 mL/min — ABNORMAL LOW (ref >90)
GFR, EST NON AFRICAN AMERICAN: 75 mL/min — AB (ref >90)
GFR, EST NON AFRICAN AMERICAN: 60 mL/min — AB (ref >90)
GLUCOSE: 106 mg/dL — AB (ref 70–99)
Glucose, Bld: 84 mg/dL (ref 70–99)
Potassium: 3.4 mmol/L — ABNORMAL LOW (ref 3.5–5.1)
Potassium: 3.5 mmol/L (ref 3.5–5.1)
SODIUM: 136 mmol/L (ref 135–145)
Sodium: 137 mmol/L (ref 135–145)
Total Bilirubin: 5.1 mg/dL — ABNORMAL HIGH (ref 0.3–1.2)
Total Protein: 7.3 g/dL (ref 6.0–8.3)
Total Protein: 7.6 g/dL (ref 6.0–8.3)

## 2015-04-24 LAB — IRON AND TIBC: Iron: 141 ug/dL — ABNORMAL HIGH (ref 42–145)

## 2015-04-24 LAB — HEPATIC FUNCTION PANEL
ALT: 41 U/L — AB (ref 0–35)
AST: 99 U/L — AB (ref 0–37)
Albumin: 1.3 g/dL — ABNORMAL LOW (ref 3.5–5.2)
Alkaline Phosphatase: 119 U/L — ABNORMAL HIGH (ref 39–117)
BILIRUBIN DIRECT: 2.5 mg/dL — AB (ref 0.0–0.5)
BILIRUBIN INDIRECT: 2.5 mg/dL — AB (ref 0.3–0.9)
Total Bilirubin: 5 mg/dL — ABNORMAL HIGH (ref 0.3–1.2)
Total Protein: 7.7 g/dL (ref 6.0–8.3)

## 2015-04-24 LAB — RETICULOCYTES
RBC.: 2.17 MIL/uL — AB (ref 3.87–5.11)
Retic Count, Absolute: 108.5 10*3/uL (ref 19.0–186.0)
Retic Ct Pct: 5 % — ABNORMAL HIGH (ref 0.4–3.1)

## 2015-04-24 LAB — TYPE AND SCREEN
ABO/RH(D): O POS
Antibody Screen: NEGATIVE

## 2015-04-24 LAB — ABO/RH: ABO/RH(D): O POS

## 2015-04-24 LAB — TROPONIN I
Troponin I: <0.03 ng/mL (ref <0.031)
Troponin I: <0.03 ng/mL (ref <0.031)

## 2015-04-24 LAB — MAGNESIUM: Magnesium: 1.3 mg/dL — ABNORMAL LOW (ref 1.5–2.5)

## 2015-04-24 LAB — PROTIME-INR
INR: 2.26 — ABNORMAL HIGH (ref 0.00–1.49)
Prothrombin Time: 25.1 seconds — ABNORMAL HIGH (ref 11.6–15.2)

## 2015-04-24 LAB — TSH: TSH: 1.833 u[IU]/mL (ref 0.350–4.500)

## 2015-04-24 LAB — VITAMIN B12: Vitamin B-12: 1327 pg/mL — ABNORMAL HIGH (ref 211–911)

## 2015-04-24 LAB — APTT: APTT: 46 s — AB (ref 24–37)

## 2015-04-24 LAB — PHOSPHORUS: Phosphorus: 2.8 mg/dL (ref 2.3–4.6)

## 2015-04-24 LAB — FERRITIN: FERRITIN: 1487 ng/mL — AB (ref 10–291)

## 2015-04-24 LAB — FOLATE: FOLATE: 5.8 ng/mL

## 2015-04-24 MED ORDER — ALBUTEROL SULFATE (2.5 MG/3ML) 0.083% IN NEBU: 3.0000 mL | INHALATION_SOLUTION | Freq: Four times a day (QID) | RESPIRATORY_TRACT | Status: DC | PRN

## 2015-04-24 MED ORDER — IOHEXOL 350 MG/ML SOLN
100.0000 mL | Freq: Once | INTRAVENOUS | Status: AC | PRN
  Administered 2015-04-24: 100 mL via INTRAVENOUS

## 2015-04-24 MED ORDER — MAGNESIUM SULFATE IN D5W 10-5 MG/ML-% IV SOLN
1.0000 g | Freq: Once | INTRAVENOUS | Status: AC
  Administered 2015-04-24: 1 g via INTRAVENOUS
  Filled 2015-04-24: qty 100

## 2015-04-24 MED ORDER — PANTOPRAZOLE SODIUM 40 MG IV SOLR
40.0000 mg | Freq: Two times a day (BID) | INTRAVENOUS | Status: DC
  Administered 2015-04-24 (×2): 40 mg via INTRAVENOUS
  Filled 2015-04-24 (×4): qty 40

## 2015-04-24 MED ORDER — PHYTONADIONE 5 MG PO TABS
2.5000 mg | ORAL_TABLET | Freq: Once | ORAL | Status: AC
  Administered 2015-04-24: 2.5 mg via ORAL
  Filled 2015-04-24: qty 1

## 2015-04-24 NOTE — Progress Notes (Signed)
PROGRESS NOTE  Diane Cook WUJ:[id number] DOB: July 17, 1956 DOA: 04/23/2015 PCP: No PCP Per Patient  HPI/Recap of past 24 hours:  Very poor historian, recently relocated to [place] from [place], Reported h/o mi, s/p cardiac cath in 2014, not able to provide detail, currently denies chest pain. Reported progressive DOE for the last year, bilateral lower extremity edema for several months. Trying to quit cigarrette, now 2 a day. Drinking alcohol daily since she was 84yrs , reported h/o shakes if stop drinking. Denies h/o seizure. Does not see a physician on regular basis. Reported signs of sleep apnea, with witnessed apnea at night per husband, never had sleep study done.  Assessment/Plan: Principal Problem:   Acute CHF (congestive heart failure) Active Problems:   Alcohol abuse   suspected Liver cirrhosis   Coagulopathy   Anemia   Thrombocytopenia   Hypomagnesemia   Obesity  Sob/edema/doe: likely chf exacerbation, echo pending, will also check CTA chest to r/p PE , patient baseline sedentary. Currently on lasix/tele  Elevation of lft with reported h/o hepc, h/o cholecystectomy. H/o chronic alcohol use. In the setting of suspected chf, Continue trend lft, avoid hepatotoxin.  May need hepatology consult/liver biopsy if not improving,    Elevated INR/thrombocytopenia, likely secondary to elevated liver function, ab [place] no acute findings, monitor, no sign off bleed, no indication for transfusion.  Macrocytic Anemia: from chronic alcohol use? unknown baseline. Stool guaiac pending. Tsh/b12/folate unremarkable,  retic count not elevated. No indication for transfusion.  Morbid obesity, suspect sleep apnea, need outpatient sleep study.  Alcohol use, on ciwa protocol.  obtain medical record from [place].  Code Status: full  Family Communication: patient and husband  Disposition Plan: remain inpatient   Consultants:  none  Procedures:  CTA chest  pendindg  Antibiotics:  none   Objective: BP 98/55 mmHg  Pulse 92  Temp(Src) 98.1 F (36.7 C) (Oral)  Resp 20  Ht 5' 11.5" (1.816 m)  Wt 176.903 kg (390 lb)  BMI 53.64 kg/m2  SpO2 97%  Intake/Output Summary (Last 24 hours) at 04/24/15 1538 Last data filed at 04/24/15 0600  Gross per 24 hour  Intake      0 ml  Output   1650 ml  Net  -1650 ml   Filed Weights   04/23/15 1834 04/24/15 0033  Weight: 177.923 kg (392 lb 4 oz) 176.903 kg (390 lb)    Exam:   General:  NAD, obese  Cardiovascular: RRR  Respiratory: difficult to auscultate due to body habitus, very diminished, no wheezing/ no rhochi, possible rales at bases.  Abdomen: Soft/ND/NT, positive BS  Musculoskeletal: + pitting  Edema  Bilateral lower extremity  Neuro: aaox3, no focal deficit  Data Reviewed: Basic Metabolic Panel:  Recent Labs Lab 04/23/15 1836 04/24/15 0120  NA 138 136  K 3.7 3.4*  CL 108 108  CO2 23 24  GLUCOSE 106* 84  BUN 7 7  CREATININE 0.85 0.83  CALCIUM 7.5* 7.5*  MG  --  1.3*  PHOS  --  2.8   Liver Function Tests:  Recent Labs Lab 04/23/15 2145 04/24/15 0120  AST 99* 104*  ALT 41* 43*  ALKPHOS 119* 115  BILITOT 5.0* 5.1*  PROT 7.7 7.3  ALBUMIN 1.3* 1.3*   No results for input(s): LIPASE, AMYLASE in the last 168 hours. No results for input(s): AMMONIA in the last 168 hours. CBC:  Recent Labs Lab 04/23/15 1836 04/24/15 0120 04/24/15 0505  WBC 5.6 5.7 5.4  NEUTROABS  --  2.2  2.1  HGB 8.2* 7.9* 8.2*  HCT 24.7* 23.0* 24.4*  MCV 112.3* 109.5* 112.4*  PLT 59* 65* 56*   Cardiac Enzymes:    Recent Labs Lab 04/24/15 0120 04/24/15 0505 04/24/15 1105  TROPONINI <0.03 <0.03 <0.03   BNP (last 3 results)  Recent Labs  04/23/15 1836  BNP 100.3*    ProBNP (last 3 results) No results for input(s): PROBNP in the last 8760 hours.  CBG: No results for input(s): GLUCAP in the last 168 hours.  No results found for this or any previous visit (from the past  240 hour(s)).   Studies: Dg Chest 2 View  04/23/2015   CLINICAL DATA:  Difficulty breathing with lower extremity edema for 4 days  EXAM: CHEST  2 VIEW  COMPARISON:  None.  FINDINGS: There is mild interstitial edema. There is no airspace consolidation or effusion. Heart is mildly enlarged. There is mild pulmonary venous hypertension. No adenopathy. No bone lesions.  IMPRESSION: Findings indicative of a degree of congestive heart failure. No airspace consolidation.   Electronically Signed   By: Bretta BangWilliam  Woodruff III M.D.   On: 04/23/2015 20:23    Scheduled Meds: . fluticasone  2 spray Each Nare Daily  . folic acid  1 mg Oral Daily  . furosemide  40 mg Intravenous BID  . lisinopril  2.5 mg Oral Daily  . multivitamin with minerals  1 tablet Oral Daily  . pantoprazole (PROTONIX) IV  40 mg Intravenous Q12H  . sodium chloride  3 mL Intravenous Q12H  . thiamine  100 mg Oral Daily    Continuous Infusions:    Time spent: >3635mins  Izekiel Flegel MD, PhD  [facility] Pager [phone]. If 7PM-7AM, please contact night-coverage at [email or web address], password [email or web address] 04/24/2015, 3:38 PM  LOS: 1 day

## 2015-04-24 NOTE — Evaluation (Signed)
Physical Therapy Evaluation Patient Details Name: Diane Cook MRN: [id number] DOB: [birth year] Today's Date: 04/24/2015   History of Present Illness  59 y.o. female admitted with Acute CHF (congestive heart failure). Hx of alcohol abuse  Clinical Impression  Pt admitted with above complications. Pt currently with functional limitations due to the deficits listed below (see PT Problem List). A&O x4, patient tolerated gait training well, and uses a cane at her baseline. No loss of balance noted during bout although is slightly unsteady. States she will have 24 hour supervision at home and her only concern is that she is fearful of falling when climbing in/out of the tub. Recommended tub bench DME for pt at home. Will follow until d/c. Pt will benefit from skilled PT to increase their independence and safety.    Follow Up Recommendations No PT follow up;Supervision - Intermittent    Equipment Recommendations  Other (comment) (Tub Bench)    Recommendations for Other Services       Precautions / Restrictions Precautions Precautions: None Precaution Comments: monitor O2 Restrictions Weight Bearing Restrictions: No      Mobility  Bed Mobility Overal bed mobility: Modified Independent                Transfers Overall transfer level: Needs assistance Equipment used: Straight cane Transfers: Sit to/from Stand Sit to Stand: Supervision         General transfer comment: supervision for safety. [place] [employer] VC for hand placement. No physical assist required. Does use cane for stability upon standing.  Ambulation/Gait Ambulation/Gait assistance: Supervision Ambulation Distance (Feet): 125 Feet Assistive device: Straight cane Gait Pattern/deviations: Step-through pattern;Wide base of support     General Gait Details: 3/4 dyspnea on exertion, SpO2 measuring at 93% on room air after ambulatory bout (97% at rest.) Supervision for safety. Hard Lt heel strike at times but  demonstrates good control of SPC. No buckling or loss of balance noted during bout. Cues to not rush in room for safety.  Stairs            Wheelchair Mobility    Modified Rankin (Stroke Patients Only)       Balance Overall balance assessment: Needs assistance Sitting-balance support: No upper extremity supported;Feet supported Sitting balance-Leahy Scale: Good     Standing balance support: No upper extremity supported Standing balance-Leahy Scale: Fair                               Pertinent Vitals/Pain Pain Assessment: No/denies pain    Home Living Family/patient expects to be discharged to:: Private residence Living Arrangements: Spouse/significant other Available Help at Discharge: Family;Available 24 hours/day Type of Home: Apartment Home Access: Stairs to enter Entrance Stairs-Rails: Left Entrance Stairs-Number of Steps: 11 Home Layout: One level Home Equipment: Cane - single point;Walker - 2 wheels      Prior Function Level of Independence: Independent with assistive device(s)         Comments: cane for mobility     Hand Dominance   Dominant Hand: Right    Extremity/Trunk Assessment   Upper Extremity Assessment: Defer to OT evaluation           Lower Extremity Assessment: Overall WFL for tasks assessed         Communication   Communication: No difficulties  Cognition Arousal/Alertness: Awake/alert Behavior During Therapy: WFL for tasks assessed/performed Overall Cognitive Status: Within Functional Limits for tasks assessed  General Comments General comments (skin integrity, edema, etc.): educated on pursed lip breathing technique for respiratory control when dyspneic    Exercises        Assessment/Plan    PT Assessment Patient needs continued PT services  PT Diagnosis Abnormality of gait   PT Problem List Decreased activity tolerance;Decreased balance;Decreased  mobility;Cardiopulmonary status limiting activity;Obesity  PT Treatment Interventions DME instruction;Gait training;Stair training;Functional mobility training;Therapeutic activities;Therapeutic exercise;Balance training;Neuromuscular re-education;Patient/family education;Modalities   PT Goals (Current goals can be found in the Care Plan section) Acute Rehab PT Goals Patient Stated Goal: Go home PT Goal Formulation: With patient Time For Goal Achievement: 05/08/15 Potential to Achieve Goals: Good    Frequency Min 3X/week   Barriers to discharge        Co-evaluation               End of Session   Activity Tolerance: Patient tolerated treatment well Patient left: in chair;with call bell/phone within reach;with family/visitor present Nurse Communication: Mobility status         Time: [id number] PT Time Calculation (min) (ACUTE ONLY): 11 min   Charges:   PT Evaluation $Initial PT Evaluation Tier I: 1 Procedure     PT G CodesBerton Mount:        Rashiya Lofland S 04/24/2015, 7:20 PM Sunday SpillersLogan Secor Columbia[place]bour, South CarolinaPT [phone]

## 2015-04-24 NOTE — Progress Notes (Signed)
Patient arrived to room from ED. Alert and oriented X 4. SR on telemetry. CIWA protocall initiated. No skin break down observed. VS stable. No complaints of pain. Pt oriented to call bell and room. Patient NPO at this time.

## 2015-04-25 DIAGNOSIS — I5031 Acute diastolic (congestive) heart failure: Principal | ICD-10-CM

## 2015-04-25 DIAGNOSIS — I509 Heart failure, unspecified: Secondary | ICD-10-CM

## 2015-04-25 LAB — CBC
HCT: 23.3 % — ABNORMAL LOW (ref 36.0–46.0)
HEMOGLOBIN: 7.9 g/dL — AB (ref 12.0–15.0)
MCH: 38 pg — ABNORMAL HIGH (ref 26.0–34.0)
MCHC: 33.9 g/dL (ref 30.0–36.0)
MCV: 112 fL — ABNORMAL HIGH (ref 78.0–100.0)
PLATELETS: 67 10*3/uL — AB (ref 150–400)
RBC: 2.08 MIL/uL — ABNORMAL LOW (ref 3.87–5.11)
RDW: 21.5 % — ABNORMAL HIGH (ref 11.5–15.5)
WBC: 4.9 10*3/uL (ref 4.0–10.5)

## 2015-04-25 LAB — URINALYSIS, ROUTINE W REFLEX MICROSCOPIC
BILIRUBIN URINE: NEGATIVE
GLUCOSE, UA: NEGATIVE mg/dL
Hgb urine dipstick: NEGATIVE
Ketones, ur: NEGATIVE mg/dL
Leukocytes, UA: NEGATIVE
Nitrite: NEGATIVE
Protein, ur: NEGATIVE mg/dL
SPECIFIC GRAVITY, URINE: 1.015 (ref 1.005–1.030)
Urobilinogen, UA: 1 mg/dL (ref 0.0–1.0)
pH: 6 (ref 5.0–8.0)

## 2015-04-25 LAB — DIRECT ANTIGLOBULIN TEST (NOT AT ARMC)
DAT, IGG: NEGATIVE
DAT, complement: NEGATIVE

## 2015-04-25 LAB — COMPREHENSIVE METABOLIC PANEL
ALT: 40 U/L — ABNORMAL HIGH (ref 0–35)
ANION GAP: 6 (ref 5–15)
AST: 99 U/L — ABNORMAL HIGH (ref 0–37)
Albumin: 1.2 g/dL — ABNORMAL LOW (ref 3.5–5.2)
Alkaline Phosphatase: 105 U/L (ref 39–117)
BUN: 7 mg/dL (ref 6–23)
CALCIUM: 7.6 mg/dL — AB (ref 8.4–10.5)
CO2: 25 mmol/L (ref 19–32)
CREATININE: 0.97 mg/dL (ref 0.50–1.10)
Chloride: 108 mmol/L (ref 96–112)
GFR calc non Af Amer: 62 mL/min — ABNORMAL LOW (ref >90)
GFR, EST AFRICAN AMERICAN: 72 mL/min — AB (ref >90)
GLUCOSE: 75 mg/dL (ref 70–99)
Potassium: 3.7 mmol/L (ref 3.5–5.1)
Sodium: 139 mmol/L (ref 135–145)
TOTAL PROTEIN: 7.3 g/dL (ref 6.0–8.3)
Total Bilirubin: 5.1 mg/dL — ABNORMAL HIGH (ref 0.3–1.2)

## 2015-04-25 LAB — BILIRUBIN, FRACTIONATED(TOT/DIR/INDIR)
BILIRUBIN TOTAL: 5.3 mg/dL — AB (ref 0.3–1.2)
Bilirubin, Direct: 2.6 mg/dL — ABNORMAL HIGH (ref 0.0–0.5)
Indirect Bilirubin: 2.7 mg/dL — ABNORMAL HIGH (ref 0.3–0.9)

## 2015-04-25 LAB — SAVE SMEAR

## 2015-04-25 LAB — MAGNESIUM: Magnesium: 1.3 mg/dL — ABNORMAL LOW (ref 1.5–2.5)

## 2015-04-25 LAB — LACTATE DEHYDROGENASE: LDH: 212 U/L (ref 94–250)

## 2015-04-25 LAB — PROTIME-INR
INR: 2.22 — ABNORMAL HIGH (ref 0.00–1.49)
PROTHROMBIN TIME: 24.8 s — AB (ref 11.6–15.2)

## 2015-04-25 MED ORDER — TRAMADOL HCL 50 MG PO TABS
50.0000 mg | ORAL_TABLET | Freq: Three times a day (TID) | ORAL | Status: DC | PRN
  Administered 2015-04-25: 50 mg via ORAL
  Filled 2015-04-25: qty 1

## 2015-04-25 MED ORDER — PANTOPRAZOLE SODIUM 40 MG PO TBEC
40.0000 mg | DELAYED_RELEASE_TABLET | Freq: Two times a day (BID) | ORAL | Status: DC
  Administered 2015-04-25 – 2015-04-26 (×3): 40 mg via ORAL
  Filled 2015-04-25 (×3): qty 1

## 2015-04-25 NOTE — Clinical Social Work Note (Cosign Needed)
Clinical Social Work Assessment  Patient Details  Name: Diane Cook MRN: [id number] Date of Birth: 12/31/1955  Date of referral:  04/25/15               Reason for consult:  Rule-out Psychosocial                Permission sought to share information with:    Permission granted to share information::     Name::        Agency::     Relationship::     Contact Information:     Housing/Transportation Living arrangements for the past 2 months:  Apartment Source of Information:  Patient Patient Interpreter Needed:  None Criminal Activity/Legal Involvement Pertinent to Current Situation/Hospitalization:  No - Comment as needed Significant Relationships:  Adult Children Lives with:  Adult Children Coy Saunas(Diane Cook) Do you feel safe going back to the place where you live?  Yes Need for family participation in patient care:  Yes (Comment) (patient needs supervised care at home.)  Care giving concerns:  Patient discharging home with home health, needing supervision.    Social Worker assessment / plan:   CSW Intern discussed plan for discharge with patient. Patient states she lives with her adult daughter, Diane Cook, and daughter will provide support in the home. Patient appreciative of CSW Intern support and engaged during assessment.   Employment status:  Unemployed Health and safety inspectornsurance information:  Self Pay (Medicaid Pending) PT Recommendations:  Home with Home Health Information / Referral to community resources:  SBIRT  Patient/Family's Response to care:   Patient alert and oriented x4 in bed watching television. No family or friends currently at bedside. Patient engaged in CSW Intern assessment, despite arthritic pain in her hand, and agreeable with discharge plan. Patient understanding of Social Work role and appreciative of support.   Patient/Family's Understanding of and Emotional Response to Diagnosis, Current Treatment, and Prognosis:  Patient understanding and agreeable to  home health with support from daughter.   Emotional Assessment Appearance:  Appears stated age Attitude/Demeanor/Rapport:  Other (Appropriate) Affect (typically observed):  Appropriate Orientation:  Oriented to Self, Oriented to Place, Oriented to  Time, Oriented to Situation Alcohol / Substance use:  Alcohol Use Psych involvement (Current and /or in the community):  No (Comment)  Discharge Needs  Concerns to be addressed:  Discharge Planning Concerns Readmission within the last 30 days:  No Current discharge risk:  None Barriers to Discharge:  No Barriers Identified   Diane Cook, Diane Cook, Student-SW 04/25/2015, 9:18 PM

## 2015-04-25 NOTE — Progress Notes (Signed)
CSW Intern completed SBIRT assessment with patient. Patient does not feel she has a problem with alcohol and states she "quit before" and plans on doing it again when she gets out of the hospital. Patient stated she and her daughter plan to stop drinking together and will do so by "going to the gym" which will keep patient "busy." CSW Intern provided support and offered resources. Patient refused resources. No further intervention needed.

## 2015-04-25 NOTE — Progress Notes (Signed)
Echocardiogram 2D Echocardiogram has been performed.  Laelyn Blumenthal 04/25/2015, 2:00 PM

## 2015-04-25 NOTE — Progress Notes (Signed)
CSW has read and reviewed assessment completed by MSW Student Marcello MooresIntern,Jeneya McLean, and I agree with her assessment.

## 2015-04-25 NOTE — Care Management Note (Addendum)
    Page 1 of 1   04/26/2015     5:19:53 PM CARE MANAGEMENT NOTE 04/26/2015  Patient:  Diane Cook,Diane Cook   Account Number:  [id number]  Date Initiated:  04/25/2015  Documentation initiated by:  Lennox Leikam  Subjective/Objective Assessment:   Pt adm on 04/23/15 with CHF.  PTA, pt resides at home with significant other.  Current ETOH abuse.     Action/Plan:   CSW consulted for ETOH counseling.  Will follow for home needs as pt progresses.   Anticipated DC Date:  04/27/2015   Anticipated DC Plan:  HOME/SELF CARE      DC Planning Services  CM consult  [facility]  [facility]      Choice offered to / List presented to:             Status of service:  Completed, signed off Medicare Important Message given?  NO (If response is "NO", the following Medicare IM given date fields will be blank) Date Medicare IM given:   Medicare IM given by:   Date Additional Medicare IM given:   Additional Medicare IM given by:    Discharge Disposition:  HOME/SELF CARE  Per UR Regulation:  Reviewed for med. necessity/level of care/duration of stay  If discussed at Long Length of Stay Meetings, dates discussed:    Comments:  04/26/15 Sidney AceJulie Woodward Klem, RN, BSN [phone] Tub bench ordered for pt.  She has no insurance, and cost is $60.  Pt states [facility] to pick up tub bench on Monday, when she gets the money.  Obtained set of scales for pt that goes up to 440 lbs for home use. Pt has no PCP; [facility] 4[facility].  She is appreciative of help.

## 2015-04-25 NOTE — Progress Notes (Signed)
Call received from Danna in lab asking if ok to add DAT test to blood already collected in lab.  Notified Dr. Roda ShuttersXU and she  instructed it was ok to add.  Notified Danna in Lab.  Amanda PeaNellie Rumeal Cullipher, Charity fundraiserN.

## 2015-04-25 NOTE — Consult Note (Signed)
Unassigned Consult  Reason for Consult: Elevated liver enzymes and cirrhosis Referring Physician: [facility]  Diane Cook HPI: This is a 59 year old female with a history of ETOH abuse, morbid obesity, HCV, asthma, CVA, bipolar, and HTN admitted with complaints of SOB and chest pain.  It was felt that she had issues with CHF.  She has improved with therapy and further evaluation is requested for cirrhosis, ascites, and elevated liver enzymes.  CT angio performed for her chest pain revealed cirrhosis and ascites.  Her platelets are also low consistent with this finding of cirrhosis.  She drinks, on average three 40 oz beers per day.  She denies any prior history of IVDA or blood transfusions before 1990.  Past Medical History  Diagnosis Date  . Hypertension   . Arthritis   . Stroke   . Asthma   . H/O non anemic vitamin B12 deficiency   . Hepatitis C   . Alcoholism   . Family history of adverse reaction to anesthesia     sister & daughter have a hard time waking up   . Bipolar disorder     Past Surgical History  Procedure Laterality Date  . Abdominal hysterectomy    . Cholecystectomy      Family History  Problem Relation Age of Onset  . Diabetes Mother   . Diabetes Daughter     Social History:  reports that she has been smoking Cigarettes.  She has a 60 pack-year smoking history. She has never used smokeless tobacco. She reports that she drinks about 6.0 - 12.0 oz of alcohol per week. She reports that she does not use illicit drugs.  Allergies: No Known Allergies  Medications:  Scheduled: . fluticasone  2 spray Each Nare Daily  . folic acid  1 mg Oral Daily  . furosemide  40 mg Intravenous BID  . multivitamin with minerals  1 tablet Oral Daily  . pantoprazole  40 mg Oral BID  . sodium chloride  3 mL Intravenous Q12H  . thiamine  100 mg Oral Daily   Continuous:   Results for orders placed or performed during the hospital encounter of 04/23/15 (from the past 24  hour(s))  Comprehensive metabolic panel     Status: Abnormal   Collection Time: 04/24/15  4:35 PM  Result Value Ref Range   Sodium 137 135 - 145 mmol/L   Potassium 3.5 3.5 - 5.1 mmol/L   Chloride 107 96 - 112 mmol/L   CO2 26 19 - 32 mmol/L   Glucose, Bld 106 (H) 70 - 99 mg/dL   BUN 8 6 - 23 mg/dL   Creatinine, Ser 1.61 0.50 - 1.10 mg/dL   Calcium 7.6 (L) 8.4 - 10.5 mg/dL   Total Protein 7.6 6.0 - 8.3 g/dL   Albumin 1.3 (L) 3.5 - 5.2 g/dL   AST 096 (H) 0 - 37 U/L   ALT 43 (H) 0 - 35 U/L   Alkaline Phosphatase 102 39 - 117 U/L   Total Bilirubin 6.3 (H) 0.3 - 1.2 mg/dL   GFR calc non Af Amer 60 (L) >90 mL/min   GFR calc Af Amer 70 (L) >90 mL/min   Anion gap 4 (L) 5 - 15  CBC     Status: Abnormal   Collection Time: 04/25/15  4:03 AM  Result Value Ref Range   WBC 4.9 4.0 - 10.5 K/uL   RBC 2.08 (L) 3.87 - 5.11 MIL/uL   Hemoglobin 7.9 (L) 12.0 - 15.0 g/dL  HCT 23.3 (L) 36.0 - 46.0 %   MCV 112.0 (H) 78.0 - 100.0 fL   MCH 38.0 (H) 26.0 - 34.0 pg   MCHC 33.9 30.0 - 36.0 g/dL   RDW 40.9 (H) 81.1 - 91.4 %   Platelets 67 (L) 150 - 400 K/uL  Magnesium     Status: Abnormal   Collection Time: 04/25/15  4:03 AM  Result Value Ref Range   Magnesium 1.3 (L) 1.5 - 2.5 mg/dL  Protime-INR     Status: Abnormal   Collection Time: 04/25/15  4:03 AM  Result Value Ref Range   Prothrombin Time 24.8 (H) 11.6 - 15.2 seconds   INR 2.22 (H) 0.00 - 1.49  Comprehensive metabolic panel     Status: Abnormal   Collection Time: 04/25/15  4:03 AM  Result Value Ref Range   Sodium 139 135 - 145 mmol/L   Potassium 3.7 3.5 - 5.1 mmol/L   Chloride 108 96 - 112 mmol/L   CO2 25 19 - 32 mmol/L   Glucose, Bld 75 70 - 99 mg/dL   BUN 7 6 - 23 mg/dL   Creatinine, Ser 7.82 0.50 - 1.10 mg/dL   Calcium 7.6 (L) 8.4 - 10.5 mg/dL   Total Protein 7.3 6.0 - 8.3 g/dL   Albumin 1.2 (L) 3.5 - 5.2 g/dL   AST 99 (H) 0 - 37 U/L   ALT 40 (H) 0 - 35 U/L   Alkaline Phosphatase 105 39 - 117 U/L   Total Bilirubin 5.1 (H) 0.3 -  1.2 mg/dL   GFR calc non Af Amer 62 (L) >90 mL/min   GFR calc Af Amer 72 (L) >90 mL/min   Anion gap 6 5 - 15  Bilirubin, fractionated(tot/dir/indir)     Status: Abnormal   Collection Time: 04/25/15  9:07 AM  Result Value Ref Range   Total Bilirubin 5.3 (H) 0.3 - 1.2 mg/dL   Bilirubin, Direct 2.6 (H) 0.0 - 0.5 mg/dL   Indirect Bilirubin 2.7 (H) 0.3 - 0.9 mg/dL  Lactate dehydrogenase     Status: None   Collection Time: 04/25/15  9:07 AM  Result Value Ref Range   LDH 212 94 - 250 U/L  Save smear     Status: None   Collection Time: 04/25/15  9:07 AM  Result Value Ref Range   Smear Review SMEAR STAINED AND AVAILABLE FOR REVIEW      Dg Chest 2 View  04/23/2015   CLINICAL DATA:  Difficulty breathing with lower extremity edema for 4 days  EXAM: CHEST  2 VIEW  COMPARISON:  None.  FINDINGS: There is mild interstitial edema. There is no airspace consolidation or effusion. Heart is mildly enlarged. There is mild pulmonary venous hypertension. No adenopathy. No bone lesions.  IMPRESSION: Findings indicative of a degree of congestive heart failure. No airspace consolidation.   Electronically Signed   By: Bretta Bang III M.D.   On: 04/23/2015 20:23   Ct Angio Chest Pe W/cm &/or Wo Cm  04/24/2015   CLINICAL DATA:  Shortness of breath. Clinical suspicion for pulmonary embolism.  EXAM: CT ANGIOGRAPHY CHEST WITH CONTRAST  TECHNIQUE: Multidetector CT imaging of the chest was performed using the standard protocol during bolus administration of intravenous contrast. Multiplanar CT image reconstructions and MIPs were obtained to evaluate the vascular anatomy.  CONTRAST:  OMNIPAQUE IOHEXOL 350 MG/ML SOLN  COMPARISON:  None.  FINDINGS: Mediastinum/Lymph Nodes: Satisfactory opacification of pulmonary arteries is noted, and no pulmonary embolism identified. No evidence  of thoracic aortic dissection or aneurysm. Incidental note is made of aberrant origin of the right subclavian artery. Mild to moderate  cardiomegaly noted.  No masses or pathologically enlarged lymph nodes identified. Shotty axillary lymph nodes are seen bilaterally with associated diffuse chest wall edema, likely secondary to lymphedema.  Lungs/Pleura: Mild diffuse thickening of interstitial lung markings noted with mild ground-glass pulmonary opacity, suspicious for mild pulmonary edema. No evidence of pulmonary mass or consolidation. Tiny pleural effusions noted bilaterally.  Musculoskeletal:  No suspicious bone lesions identified.  Upper Abdomen: Hepatic cirrhosis and mild ascites incidentally noted.  Review of the MIP images confirms the above findings.  IMPRESSION: No evidence of acute pulmonary embolism.  Findings consistent with mild congestive heart failure.  Diffuse body wall edema, as well as shotty bilateral axillary lymph nodes likely due to lymphedema.  Aberrant origin of right subclavian artery incidentally noted.   Electronically Signed   By: Myles RosenthalJohn  Stahl M.D.   On: 04/24/2015 17:31   [place] Abdomen Complete  04/24/2015   CLINICAL DATA:  Elevated LFTs.  Cholecystectomy.  Hepatitis-C.  EXAM: ULTRASOUND ABDOMEN COMPLETE  COMPARISON:  None.  FINDINGS: Gallbladder: Cholecystectomy.  Common bile duct: Diameter: 6.8 mm  Liver: No focal lesion identified. Within normal limits in parenchymal echogenicity.  IVC: No abnormality visualized.  Pancreas: Visualized portion unremarkable.  Spleen: Size and appearance within normal limits.  Right Kidney: Length: 14.0 cm. Echogenicity within normal limits. No mass or hydronephrosis visualized.  Left Kidney: Length: 12.7 cm. Echogenicity within normal limits. No mass or hydronephrosis visualized.  Abdominal aorta: No aneurysm visualized.  Other findings: Difficult exam due to patient body habitus.  IMPRESSION: 1. Cholecystectomy. 2. No other focal abnormality identified. This was a difficult exam due to patient body habitus.   Electronically Signed   By: Maisie Fushomas  Register   On: 04/24/2015 11:00    ROS:   As stated above in the HPI otherwise negative.  Blood pressure 122/48, pulse 91, temperature 98.4 F (36.9 C), temperature source Oral, resp. rate 18, height 5' 11.5" (1.816 m), weight 173.818 kg (383 lb 3.2 oz), SpO2 95 %.    PE: Gen: NAD, Alert and Oriented HEENT:  [place]/AT, EOMI Neck: Supple, no LAD Lungs: CTA Bilaterally CV: RRR without M/G/R ABM: Soft, NTND, morbidly obese, +BS Ext: No C/C/E  Assessment/Plan: 1) ETOH cirrhosis. 2) HCV. 3) Elevated liver enzymes. 4) Morbid obesity. 5) ETOH abuse.   The patient has cirrhosis from her ETOH abuse and HCV and possibly obesity.  Since she continues to drink and she has been drinking since the age of 59, I doubt she will stop drinking.  She also has issues with withdrawal.  Her overall prognosis is very poor.  Her liver enzyme elevations are secondary to her ETOH abuse, HCV, morbid obesity, and cirrhosis.  It is a moot point to discern the exact source of her liver enzyme elevations with the multiple risk factors.  I strongly advised her to stop drinking ETOH.  With her ETOH abuse she is not a candidate for any treatment for her HCV.  Plan: 1) Stop drinking ETOH. 2) 2 gram low sodium diet. 3) No further GI evaluation.  Signing off.  Tonilynn Bieker D 04/25/2015, 1:35 PM

## 2015-04-25 NOTE — Progress Notes (Signed)
PROGRESS NOTE  Diane Cook ZOX:[id number] DOB: [birth year] DOA: 04/23/2015 PCP: No PCP Per Patient  HPI/Recap of past 24 hours:  Reported feeling better, less edema, husband at bedside Reported signs of sleep apnea, with witnessed apnea at night per husband, never had sleep study done.  Assessment/Plan: Principal Problem:   Acute CHF (congestive heart failure) Active Problems:   Alcohol abuse   suspected Liver cirrhosis   Coagulopathy   Anemia   Thrombocytopenia   Hypomagnesemia   Obesity  Sob/edema/doe: likely diastolic chf exacerbation, echo LVEF wnl, no WNA , CTA chest no PE, improving on lasix, continue.   Elevation of lft with reported h/o hepc, h/o cholecystectomy. H/o chronic alcohol use. In the setting of suspected chf, Continue trend lft, avoid hepatotoxin.  hepatology consulted.  Elevated INR/thrombocytopenia, likely secondary to elevated liver function, ab us no acute findings, monitor, no acute bleed, no indication for transfusion.  Macrocytic Anemia: from chronic alcohol use? unknown baseline. Stool guaiac pending. Tsh/b12/folate unremarkable,  retic count not elevated. No indication for transfusion.  Morbid obesity, suspect sleep apnea, need outpatient sleep study.  Alcohol use, on ciwa protocol.  obtain medical record from [place].  Code Status: full  Family Communication: patient and husband  Disposition Plan: likely home in 1-2 days with home health   Consultants:  GI  Procedures:  CTA chest   Antibiotics:  none   Objective: BP 91/56 mmHg  Pulse 86  Temp(Src) 98.4 F (36.9 C) (Oral)  Resp 18  Ht 5' 11.5" (1.816 m)  Wt 173.818 kg (383 lb 3.2 oz)  BMI 52.71 kg/m2  SpO2 92%  Intake/Output Summary (Last 24 hours) at 04/25/15 1733 Last data filed at 04/25/15 1456  Gross per 24 hour  Intake   1040 ml  Output   2756 ml  Net  -1716 ml   Filed Weights   04/23/15 1834 04/24/15 0033 04/25/15 0645  Weight: 177.923 kg (392 lb 4 oz)  176.903 kg (390 lb) 173.818 kg (383 lb 3.2 oz)    Exam:   General:  NAD, obese  Cardiovascular: RRR  Respiratory: difficult to auscultate due to body habitus, very diminished, no wheezing/ no rhochi, possible rales at bases.  Abdomen: Soft/ND/NT, positive BS  Musculoskeletal: + pitting  Edema  Bilateral lower extremity  Neuro: aaox3, no focal deficit  Data Reviewed: Basic Metabolic Panel:  Recent Labs Lab 04/23/15 1836 04/24/15 0120 04/24/15 1635 04/25/15 0403  NA 138 136 137 139  K 3.7 3.4* 3.5 3.7  CL 108 108 107 108  CO2 23 24 26 25   GLUCOSE 106* 84 106* 75  BUN 7 7 8 7   CREATININE 0.85 0.83 1.00 0.97  CALCIUM 7.5* 7.5* 7.6* 7.6*  MG  --  1.3*  --  1.3*  PHOS  --  2.8  --   --    Liver Function Tests:  Recent Labs Lab 04/23/15 2145 04/24/15 0120 04/24/15 1635 04/25/15 0403 04/25/15 0907  AST 99* 104* 107* 99*  --   ALT 41* 43* 43* 40*  --   ALKPHOS 119* 115 102 105  --   BILITOT 5.0* 5.1* 6.3* 5.1* 5.3*  PROT 7.7 7.3 7.6 7.3  --   ALBUMIN 1.3* 1.3* 1.3* 1.2*  --    No results for input(s): LIPASE, AMYLASE in the last 168 hours. No results for input(s): AMMONIA in the last 168 hours. CBC:  Recent Labs Lab 04/23/15 1836 04/24/15 0120 04/24/15 0505 04/25/15 0403  WBC 5.6 5.7 5.4 4.9  NEUTROABS  --  2.2 2.1  --   HGB 8.2* 7.9* 8.2* 7.9*  HCT 24.7* 23.0* 24.4* 23.3*  MCV 112.3* 109.5* 112.4* 112.0*  PLT 59* 65* 56* 67*   Cardiac Enzymes:    Recent Labs Lab 04/24/15 0120 04/24/15 0505 04/24/15 1105  TROPONINI <0.03 <0.03 <0.03   BNP (last 3 results)  Recent Labs  04/23/15 1836  BNP 100.3*    ProBNP (last 3 results) No results for input(s): PROBNP in the last 8760 hours.  CBG: No results for input(s): GLUCAP in the last 168 hours.  No results found for this or any previous visit (from the past 240 hour(s)).   Studies: Ct Angio Chest Pe W/cm &/or Wo Cm  04/24/2015   CLINICAL DATA:  Shortness of breath. Clinical suspicion for  pulmonary embolism.  EXAM: CT ANGIOGRAPHY CHEST WITH CONTRAST  TECHNIQUE: Multidetector CT imaging of the chest was performed using the standard protocol during bolus administration of intravenous contrast. Multiplanar CT image reconstructions and MIPs were obtained to evaluate the vascular anatomy.  CONTRAST:  OMNIPAQUE IOHEXOL 350 MG/ML SOLN  COMPARISON:  None.  FINDINGS: Mediastinum/Lymph Nodes: Satisfactory opacification of pulmonary arteries is noted, and no pulmonary embolism identified. No evidence of thoracic aortic dissection or aneurysm. Incidental note is made of aberrant origin of the right subclavian artery. Mild to moderate cardiomegaly noted.  No masses or pathologically enlarged lymph nodes identified. Shotty axillary lymph nodes are seen bilaterally with associated diffuse chest wall edema, likely secondary to lymphedema.  Lungs/Pleura: Mild diffuse thickening of interstitial lung markings noted with mild ground-glass pulmonary opacity, suspicious for mild pulmonary edema. No evidence of pulmonary mass or consolidation. Tiny pleural effusions noted bilaterally.  Musculoskeletal:  No suspicious bone lesions identified.  Upper Abdomen: Hepatic cirrhosis and mild ascites incidentally noted.  Review of the MIP images confirms the above findings.  IMPRESSION: No evidence of acute pulmonary embolism.  Findings consistent with mild congestive heart failure.  Diffuse body wall edema, as well as shotty bilateral axillary lymph nodes likely due to lymphedema.  Aberrant origin of right subclavian artery incidentally noted.   Electronically Signed   By: Myles Rosenthal M.D.   On: 04/24/2015 17:31   US Abdomen Complete  04/24/2015   CLINICAL DATA:  Elevated LFTs.  Cholecystectomy.  Hepatitis-C.  EXAM: ULTRASOUND ABDOMEN COMPLETE  COMPARISON:  None.  FINDINGS: Gallbladder: Cholecystectomy.  Common bile duct: Diameter: 6.8 mm  Liver: No focal lesion identified. Within normal limits in parenchymal  echogenicity.  IVC: No abnormality visualized.  Pancreas: Visualized portion unremarkable.  Spleen: Size and appearance within normal limits.  Right Kidney: Length: 14.0 cm. Echogenicity within normal limits. No mass or hydronephrosis visualized.  Left Kidney: Length: 12.7 cm. Echogenicity within normal limits. No mass or hydronephrosis visualized.  Abdominal aorta: No aneurysm visualized.  Other findings: Difficult exam due to patient body habitus.  IMPRESSION: 1. Cholecystectomy. 2. No other focal abnormality identified. This was a difficult exam due to patient body habitus.   Electronically Signed   By: Maisie Fus  Register   On: 04/24/2015 11:00    Scheduled Meds: . fluticasone  2 spray Each Nare Daily  . folic acid  1 mg Oral Daily  . furosemide  40 mg Intravenous BID  . multivitamin with minerals  1 tablet Oral Daily  . pantoprazole  40 mg Oral BID  . sodium chloride  3 mL Intravenous Q12H  . thiamine  100 mg Oral Daily    Continuous Infusions:  Time spent: >79mins  Dlynn Ranes MD, PhD  [facility] Pager [phone]. If 7PM-7AM, please contact night-coverage at [email or web address], password [facility] 04/25/2015, 5:33 PM  LOS: 2 days

## 2015-04-26 LAB — COMPREHENSIVE METABOLIC PANEL
ALBUMIN: 1.3 g/dL — AB (ref 3.5–5.2)
ALT: 39 U/L — ABNORMAL HIGH (ref 0–35)
AST: 96 U/L — AB (ref 0–37)
Alkaline Phosphatase: 105 U/L (ref 39–117)
Anion gap: 7 (ref 5–15)
BUN: 7 mg/dL (ref 6–23)
CALCIUM: 7.5 mg/dL — AB (ref 8.4–10.5)
CHLORIDE: 105 mmol/L (ref 96–112)
CO2: 27 mmol/L (ref 19–32)
Creatinine, Ser: 1.1 mg/dL (ref 0.50–1.10)
GFR calc non Af Amer: 53 mL/min — ABNORMAL LOW (ref >90)
GFR, EST AFRICAN AMERICAN: 62 mL/min — AB (ref >90)
Glucose, Bld: 64 mg/dL — ABNORMAL LOW (ref 70–99)
Potassium: 3.5 mmol/L (ref 3.5–5.1)
SODIUM: 139 mmol/L (ref 135–145)
Total Bilirubin: 5 mg/dL — ABNORMAL HIGH (ref 0.3–1.2)
Total Protein: 7.3 g/dL (ref 6.0–8.3)

## 2015-04-26 LAB — HEPATITIS PANEL, ACUTE
HCV Ab: REACTIVE — AB
HEP A IGM: NONREACTIVE
Hep B C IgM: NONREACTIVE
Hepatitis B Surface Ag: NEGATIVE

## 2015-04-26 LAB — CBC
HEMATOCRIT: 23 % — AB (ref 36.0–46.0)
Hemoglobin: 7.7 g/dL — ABNORMAL LOW (ref 12.0–15.0)
MCH: 37.6 pg — ABNORMAL HIGH (ref 26.0–34.0)
MCHC: 33.5 g/dL (ref 30.0–36.0)
MCV: 112.2 fL — ABNORMAL HIGH (ref 78.0–100.0)
PLATELETS: 55 10*3/uL — AB (ref 150–400)
RBC: 2.05 MIL/uL — ABNORMAL LOW (ref 3.87–5.11)
RDW: 21.2 % — AB (ref 11.5–15.5)
WBC: 4.9 10*3/uL (ref 4.0–10.5)

## 2015-04-26 LAB — MAGNESIUM: Magnesium: 1.3 mg/dL — ABNORMAL LOW (ref 1.5–2.5)

## 2015-04-26 LAB — HAPTOGLOBIN: Haptoglobin: <10 mg/dL — ABNORMAL LOW (ref 34–200)

## 2015-04-26 LAB — HIV ANTIBODY (ROUTINE TESTING W REFLEX): HIV Screen 4th Generation wRfx: NONREACTIVE

## 2015-04-26 MED ORDER — SPIRONOLACTONE 50 MG PO TABS
50.0000 mg | ORAL_TABLET | Freq: Every day | ORAL | Status: DC
  Administered 2015-04-26: 50 mg via ORAL
  Filled 2015-04-26: qty 1

## 2015-04-26 MED ORDER — SPIRONOLACTONE 50 MG PO TABS: 50.0000 mg | ORAL_TABLET | Freq: Every day | ORAL | Status: DC

## 2015-04-26 MED ORDER — FUROSEMIDE 40 MG PO TABS: 40.0000 mg | ORAL_TABLET | Freq: Every day | ORAL | Status: DC

## 2015-04-26 MED ORDER — THIAMINE HCL 100 MG PO TABS: 100.0000 mg | ORAL_TABLET | Freq: Every day | ORAL | Status: DC
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
## 2015-04-26 NOTE — Progress Notes (Signed)
Patient is being discharged home. Pt has received the discharge instructions. RN went over instructions with the patient and answered all questions the patient had. Pt is being transported home by her family.

## 2015-04-30 ENCOUNTER — Encounter: Payer: Self-pay | Admitting: Family Medicine

## 2015-04-30 ENCOUNTER — Ambulatory Visit: Payer: Medicaid Other | Attending: Family Medicine | Admitting: Family Medicine

## 2015-04-30 VITALS — BP 122/76 | HR 91 | Temp 98.2°F | Resp 18 | Ht 71.0 in | Wt 357.0 lb

## 2015-04-30 DIAGNOSIS — K7030 Alcoholic cirrhosis of liver without ascites: Secondary | ICD-10-CM | POA: Diagnosis not present

## 2015-04-30 DIAGNOSIS — M659 Synovitis and tenosynovitis, unspecified: Secondary | ICD-10-CM | POA: Diagnosis not present

## 2015-04-30 DIAGNOSIS — F1010 Alcohol abuse, uncomplicated: Secondary | ICD-10-CM | POA: Diagnosis not present

## 2015-04-30 DIAGNOSIS — Z720 Tobacco use: Secondary | ICD-10-CM

## 2015-04-30 DIAGNOSIS — J45909 Unspecified asthma, uncomplicated: Secondary | ICD-10-CM | POA: Insufficient documentation

## 2015-04-30 DIAGNOSIS — D649 Anemia, unspecified: Secondary | ICD-10-CM | POA: Insufficient documentation

## 2015-04-30 DIAGNOSIS — Z8673 Personal history of transient ischemic attack (TIA), and cerebral infarction without residual deficits: Secondary | ICD-10-CM | POA: Insufficient documentation

## 2015-04-30 DIAGNOSIS — Z6841 Body Mass Index (BMI) 40.0 and over, adult: Secondary | ICD-10-CM | POA: Insufficient documentation

## 2015-04-30 DIAGNOSIS — D551 Anemia due to other disorders of glutathione metabolism: Secondary | ICD-10-CM

## 2015-04-30 DIAGNOSIS — I5030 Unspecified diastolic (congestive) heart failure: Secondary | ICD-10-CM | POA: Diagnosis present

## 2015-04-30 DIAGNOSIS — I5031 Acute diastolic (congestive) heart failure: Secondary | ICD-10-CM

## 2015-04-30 DIAGNOSIS — F17210 Nicotine dependence, cigarettes, uncomplicated: Secondary | ICD-10-CM | POA: Insufficient documentation

## 2015-04-30 DIAGNOSIS — B1920 Unspecified viral hepatitis C without hepatic coma: Secondary | ICD-10-CM | POA: Insufficient documentation

## 2015-04-30 DIAGNOSIS — E669 Obesity, unspecified: Secondary | ICD-10-CM | POA: Diagnosis not present

## 2015-04-30 DIAGNOSIS — K7460 Unspecified cirrhosis of liver: Secondary | ICD-10-CM

## 2015-04-30 DIAGNOSIS — F1020 Alcohol dependence, uncomplicated: Secondary | ICD-10-CM

## 2015-04-30 DIAGNOSIS — D696 Thrombocytopenia, unspecified: Secondary | ICD-10-CM | POA: Diagnosis not present

## 2015-04-30 DIAGNOSIS — Z9049 Acquired absence of other specified parts of digestive tract: Secondary | ICD-10-CM | POA: Insufficient documentation

## 2015-04-30 DIAGNOSIS — Z7951 Long term (current) use of inhaled steroids: Secondary | ICD-10-CM | POA: Insufficient documentation

## 2015-04-30 MED ORDER — NICOTINE 7 MG/24HR TD PT24: 7.0000 mg | MEDICATED_PATCH | Freq: Every day | TRANSDERMAL | Status: DC

## 2015-04-30 MED ORDER — ALBUTEROL SULFATE HFA 108 (90 BASE) MCG/ACT IN AERS: 2.0000 | INHALATION_SPRAY | Freq: Four times a day (QID) | RESPIRATORY_TRACT | Status: DC | PRN

## 2015-04-30 MED ORDER — NAPROXEN 500 MG PO TABS: 500.0000 mg | ORAL_TABLET | Freq: Two times a day (BID) | ORAL | Status: DC | PRN

## 2015-04-30 NOTE — Progress Notes (Addendum)
Subjective:    Patient ID: Diane Cook, female    DOB: 1956-03-11, 59 y.o.   MRN: [id number]  HPI  Admit date: 04/23/15 Discharge date: 04/26/15  Diane Cook had presented with shortness of breath to [facility] ED with a past medical history of asthma and had been compliant with her MDI.  Chest x-ray revealed a degree of congestive heart failure, no airspace consolidation; she did have a 2-D echo which revealed LVEF of 60-65% and no regional wall abnormalities, severely dilated left atrium, CT angiogram was negative for pulmonary embolism but positive for hepatic cirrhosis and mild synovitis and findings consistent with mild CHF and so she was placed on Lasix and spironolactone with improvement in symptoms. Laboratory findings revealed elevated LFTs, thrombocytopenia, elevated INR of 2.26 and anemic with a hemoglobin of 8.2. She had endorsed a history of alcohol abuse and so was placed on CIWA protocol.  After symptoms improved she was discharged.   Interval History:  she reports feeling better and shortness of breath has decreased compared to the time of her hospitalization and she has lost a few pounds. She is requesting a refill on albuterol inhaler as well as   Naproxen.  There is no upcoming appointment with a gastroenterologist for her cirrhosis of the liver.   Past Medical History  Diagnosis Date  . Hypertension   . Arthritis   . Stroke   . Asthma   . H/O non anemic vitamin B12 deficiency   . Hepatitis C   . Alcoholism   . Family history of adverse reaction to anesthesia     sister & daughter have a hard time waking up   . Bipolar disorder     Past Surgical History  Procedure Laterality Date  . Abdominal hysterectomy    . Cholecystectomy      Family History  Problem Relation Age of Onset  . Diabetes Mother   . Diabetes Daughter     History   Social History  . Marital Status: Single    Spouse Name: N/A  . Number of Children: N/A  . Years of Education: N/A     Occupational History  . Not on file.   Social History Main Topics  . Smoking status: Current Every Day Smoker -- 0.25 packs/day for 40 years    Types: Cigarettes  . Smokeless tobacco: Never Used  . Alcohol Use: Yes     Comment: Patient drinks 6 40oz per day, 1 pint of liquor daily  . Drug Use: No  . Sexual Activity: Not on file   Other Topics Concern  . Not on file   Social History Narrative    No Known Allergies  Current Outpatient Prescriptions on File Prior to Visit  Medication Sig Dispense Refill  . fluticasone (FLONASE) 50 MCG/ACT nasal spray Place 2 sprays into both nostrils daily. 16 g 1  . furosemide (LASIX) 40 MG tablet Take 1 tablet (40 mg total) by mouth daily. 30 tablet 3  . spironolactone (ALDACTONE) 50 MG tablet Take 1 tablet (50 mg total) by mouth daily. 30 tablet 3  . thiamine 100 MG tablet Take 1 tablet (100 mg total) by mouth daily. 30 tablet 3   No current facility-administered medications on file prior to visit.    Review of Systems  Constitutional: Negative for activity change, appetite change and fatigue.  HENT: Negative for congestion, sinus pressure and sore throat.   Eyes: Negative for visual disturbance.  Respiratory: Negative for cough, chest tightness, shortness  of breath and wheezing.   Cardiovascular: Negative for chest pain and palpitations.  Gastrointestinal: Negative for abdominal pain, constipation and abdominal distention.  Endocrine: Negative for polydipsia.  Genitourinary: Negative for dysuria and frequency.  Musculoskeletal: Negative for back pain and arthralgias.  Skin: Negative for rash.  Neurological: Negative for tremors, light-headedness and numbness.  Hematological: Does not bruise/bleed easily.  Psychiatric/Behavioral: Negative for behavioral problems and agitation.         Objective: Filed Vitals:   04/30/15 1552  BP: 122/76  Pulse: 91  Temp: 98.2 F (36.8 C)  Resp: 18      Physical Exam  Constitutional: She  is oriented to person, place, and time. She appears well-developed and well-nourished. No distress.  Obese  HENT:  Head: Normocephalic.  Right Ear: External ear normal.  Left Ear: External ear normal.  Nose: Nose normal.  Mouth/Throat: Oropharynx is clear and moist.  Eyes: Conjunctivae and EOM are normal. Pupils are equal, round, and reactive to light.  Neck: Normal range of motion. No JVD present.  Cardiovascular: Normal rate, regular rhythm and intact distal pulses.  Exam reveals no gallop.   Murmur heard. Pulmonary/Chest: Effort normal and breath sounds normal. No respiratory distress. She has no wheezes. She has no rales. She exhibits no tenderness.  Abdominal: Soft. Bowel sounds are normal. She exhibits no distension and no mass. There is tenderness.  RUQ scar, striae all over anterior abdominal wall  Musculoskeletal: Normal range of motion. She exhibits no edema or tenderness.  Neurological: She is alert and oriented to person, place, and time. She has normal reflexes.  Skin: Skin is warm and dry. She is not diaphoretic.  Psychiatric: She has a normal mood and affect.   CBC Latest Ref Rng 04/26/2015 04/25/2015 04/24/2015  WBC 4.0 - 10.5 K/uL 4.9 4.9 5.4  Hemoglobin 12.0 - 15.0 g/dL 7.7(L) 7.9(L) 8.2(L)  Hematocrit 36.0 - 46.0 % 23.0(L) 23.3(L) 24.4(L)  Platelets 150 - 400 K/uL 55(L) 67(L) 56(L)     CMP Latest Ref Rng 04/26/2015 04/25/2015 04/25/2015  Glucose 70 - 99 mg/dL 96(E64(L) - 75  BUN 6 - 23 mg/dL 7 - 7  Creatinine 4.540.50 - 1.10 mg/dL 0.981.10 - 1.190.97  Sodium 147135 - 145 mmol/L 139 - 139  Potassium 3.5 - 5.1 mmol/L 3.5 - 3.7  Chloride 96 - 112 mmol/L 105 - 108  CO2 19 - 32 mmol/L 27 - 25  Calcium 8.4 - 10.5 mg/dL 7.5(L) - 7.6(L)  Total Protein 6.0 - 8.3 g/dL 7.3 - 7.3  Total Bilirubin 0.3 - 1.2 mg/dL 5.0(H) 5.3(H) 5.1(H)  Alkaline Phos 39 - 117 U/L 105 - 105  AST 0 - 37 U/L 96(H) - 99(H)  ALT 0 - 35 U/L 39(H) - 40(H)      EXAM: CT ANGIOGRAPHY CHEST WITH  CONTRAST  TECHNIQUE: Multidetector CT imaging of the chest was performed using the standard protocol during bolus administration of intravenous contrast. Multiplanar CT image reconstructions and MIPs were obtained to evaluate the vascular anatomy.  CONTRAST: 100mL OMNIPAQUE IOHEXOL 350 MG/ML SOLN  COMPARISON: None.  FINDINGS: Mediastinum/Lymph Nodes: Satisfactory opacification of pulmonary arteries is noted, and no pulmonary embolism identified. No evidence of thoracic aortic dissection or aneurysm. Incidental note is made of aberrant origin of the right subclavian artery. Mild to moderate cardiomegaly noted.  No masses or pathologically enlarged lymph nodes identified. Shotty axillary lymph nodes are seen bilaterally with associated diffuse chest wall edema, likely secondary to lymphedema.  Lungs/Pleura: Mild diffuse thickening of interstitial  lung markings noted with mild ground-glass pulmonary opacity, suspicious for mild pulmonary edema. No evidence of pulmonary mass or consolidation. Tiny pleural effusions noted bilaterally.  Musculoskeletal: No suspicious bone lesions identified.  Upper Abdomen: Hepatic cirrhosis and mild ascites incidentally noted.  Review of the MIP images confirms the above findings.  IMPRESSION: No evidence of acute pulmonary embolism.  Findings consistent with mild congestive heart failure.  Diffuse body wall edema, as well as shotty bilateral axillary lymph nodes likely due to lymphedema.  Aberrant origin of right subclavian artery incidentally noted.   Electronically Signed  By: Myles Rosenthal M.D.  On: 04/24/2015 17:31      Assessment & Plan:   59 year old female patient recently hospitalized for acute diastolic congestive heart failure. Incidental finding of cirrhosis secondary to alcohol consumption and laboratory findings of  suggestive of hepatic decompensation.   Hepatic cirrhosis secondary to alcohol abuse:    Continue Lasix and spironolactone , I will check a comprehensive metabolic panel as she does have elevated liver enzymes as well as hyperbilirubinemia.  Referral to gastroenterologist placed as she would need to follow-up.   Diastolic heart failure:   No evidence of acute exacerbation at this time.   Alcohol abuse:   Child psychotherapist in to see patient to discuss referral to alcohol anonymous; as I have explained to her the need to quit alcohol consumption.     Thrombocytopenia:   this is secondary to alcohol consumption ; we will repeat CBC to evaluate platelet count.     Anemia:  Discharge  Hemoglobin was 7.7   Tobacco abuse: Smoking cessation support: smoking cessation hotline: 1-800-QUIT-NOW.  Smoking cessation classes are available through [facility]. Call [phone] or visit our website at [email or web address].  Spent 4 minutes counseling on smoking cessation and patient is ready to quit.Placed on nicotine patches.  Disclaimer: This note was dictated with voice recognition software. Similar sounding words can inadvertently be transcribed and this note may contain transcription errors which may not have been corrected upon publication of note.

## 2015-04-30 NOTE — Patient Instructions (Signed)
Cirrhosis  Cirrhosis is a condition of scarring of the liver which is caused when the liver has tried repairing itself following damage. This damage may come from a previous infection such as one of the forms of hepatitis (usually hepatitis C), or the damage may come from being injured by toxins. The main toxin that causes this damage is alcohol. The scarring of the liver from use of alcohol is irreversible. That means the liver cannot return to normal even though alcohol is not used any more. The main danger of hepatitis C infection is that it may cause long-lasting (chronic) liver disease, and this also may lead to cirrhosis. This complication is progressive and irreversible.  CAUSES   Prior to available blood tests, hepatitis C could be contracted by blood transfusions. Since testing of blood has improved, this is now unlikely. This infection can also be contracted through intravenous drug use and the sharing of needles. It can also be contracted through sexual relationships. The injury caused by alcohol comes from too much use. It is not a few drinks that poison the liver, but years of misuse. Usually there will be some signs and symptoms early with scarring of the liver that suggest the development of better habits. Alcohol should never be used while using acetaminophen. A small dose of both taken together may cause irreversible damage to the liver.  HOME CARE INSTRUCTIONS   There is no specific treatment for cirrhosis. However, there are things you can do to avoid making the condition worse.  · Rest as needed.  · Eat a well-balanced diet. Your caregiver can help you with suggestions.  · Vitamin supplements including vitamins A, K, D, and thiamine can help.  · A low-salt diet, water restriction, or diuretic medicine may be needed to reduce fluid retention.  · Avoid alcohol. This can be extremely toxic if combined with acetaminophen.  · Avoid drugs which are toxic to the liver. Some of these include isoniazid,  methyldopa, acetaminophen, anabolic steroids (muscle-building drugs), erythromycin, and oral contraceptives (birth control pills). Check with your caregiver to make sure medicines you are presently taking will not be harmful.  · Periodic blood tests may be required. Follow your caregiver's advice regarding the timing of these.  · Milk thistle is an herbal remedy which does protect the liver against toxins. However, it will not help once the liver has been scarred.  SEEK MEDICAL CARE IF:  · You have increasing fatigue or weakness.  · You develop swelling of the hands, feet, legs, or face.  · You vomit bright red blood, or a coffee ground appearing material.  · You have blood in your stools, or the stools turn black and tarry.  · You have a fever.  · You develop loss of appetite, or have nausea and vomiting.  · You develop jaundice.  · You develop easy bruising or bleeding.  · You have worsening of any of the problems you are concerned about.  Document Released: 12/14/2005 Document Revised: 03/07/2012 Document Reviewed: 08/01/2008  ExitCare® Patient Information ©2015 [employer]. This information is not intended to replace advice given to you by your health care provider. Make sure you discuss any questions you have with your health care provider.

## 2015-04-30 NOTE — Progress Notes (Signed)
ASSESSMENT: Pt experiencing motivation to begin inpatient treatment for alcohol abuse. [facility], attend her upcoming financial counseling appointment, and go to outpatient treatment for alcohol. Pt would benefit from continued outpatient treatment, psychoeducation about alcohol abuse; would also benefit from stop smoking and being open to inpatient treatment, if needed.  Stage of Change: action  PP AGREES TO FOLLOWING TREATMENT PLAN: 1. F/U with behavioral health consultant in one week. (She may call on Monday, 05-06-15, [facility]). Will do psychoeducation about alcohol abuse at next appointment. 2. Psychiatric Medications: none 3. Behavioral recommendation(s):   -Attend financial counseling appointment with [facility] -[facility] to begin outpatient treatment for alcohol abuse -Consider [facility] for inpatient treatment, if needed -Consider stop smoking quit date   SUBJECTIVE: Pt. referred by Dr. Venetia NightAmao for alcohol abuse Pt. here for  REFERRAL  regarding alcohol abuse Pt. reports the following symptoms/concerns: Pt states that she wants to stop drinking alcohol and smoking cigarettes, but that she wants to do only one at a time, starting with stopping drinking. She says that she and her husband have been together over 37 years, and that he is her greatest support. She states that she started drinking over 50 years ago; now she sees it is affecting her health, she wants to try to stop. She is open to any help available, and says she would be willing to do either inpatient or outpatient treatment.  Duration of problem: <50 years(alcohol use)/ motivation for change, recent Severity: moderate  OBJECTIVE: Orientation & Cognition: Oriented x3. Thought processes normal and appropriate to situation. Mood: appropriate Affect: appropriate Appearance: appropriate Risk of harm to self or  others: no risk of harm to self or others Substance use: alcohol, tobacco Psychiatric medication use: None Assessments administered: AUDIT- 16  Diagnosis: Alcohol abuse/ Alcoholism/ Tobacco abuse CPT Code: F10.10/ F10.20/Z72.0 -------------------------------------------- Other(s) present in the room: pt husband  Time spent with patient in exam room: 20 minutes

## 2015-04-30 NOTE — Progress Notes (Signed)
Patient hospitalized, discharged 04/26/15 for swelling all over and shortness of breath. Patient reports having liver problems. Patient reports no pain at this time. Patient needs refill for albuterol inhaler,

## 2015-05-01 ENCOUNTER — Encounter: Payer: Self-pay | Admitting: *Deleted

## 2015-05-01 LAB — CBC WITH DIFFERENTIAL/PLATELET
BASOS PCT: 3 % — AB (ref 0–1)
Basophils Absolute: 0.2 10*3/uL — ABNORMAL HIGH (ref 0.0–0.1)
EOS ABS: 0.4 10*3/uL (ref 0.0–0.7)
Eosinophils Relative: 7 % — ABNORMAL HIGH (ref 0–5)
HEMATOCRIT: 24.2 % — AB (ref 36.0–46.0)
Hemoglobin: 8.8 g/dL — ABNORMAL LOW (ref 12.0–15.0)
Lymphocytes Relative: 41 % (ref 12–46)
Lymphs Abs: 2.5 10*3/uL (ref 0.7–4.0)
MCH: 37.8 pg — ABNORMAL HIGH (ref 26.0–34.0)
MCHC: 36.4 g/dL — AB (ref 30.0–36.0)
MCV: 103.9 fL — AB (ref 78.0–100.0)
MONOS PCT: 9 % (ref 3–12)
Monocytes Absolute: 0.5 10*3/uL (ref 0.1–1.0)
Neutro Abs: 2.4 10*3/uL (ref 1.7–7.7)
Neutrophils Relative %: 40 % — ABNORMAL LOW (ref 43–77)
Platelets: 66 10*3/uL — ABNORMAL LOW (ref 150–400)
RBC: 2.33 MIL/uL — ABNORMAL LOW (ref 3.87–5.11)
RDW: 18.2 % — AB (ref 11.5–15.5)
WBC: 6 10*3/uL (ref 4.0–10.5)

## 2015-05-01 LAB — COMPREHENSIVE METABOLIC PANEL
ALBUMIN: 1.6 g/dL — AB (ref 3.5–5.2)
ALT: 37 U/L — ABNORMAL HIGH (ref 0–35)
AST: 89 U/L — AB (ref 0–37)
Alkaline Phosphatase: 131 U/L — ABNORMAL HIGH (ref 39–117)
BUN: 7 mg/dL (ref 6–23)
CHLORIDE: 105 meq/L (ref 96–112)
CO2: 29 meq/L (ref 19–32)
CREATININE: 0.86 mg/dL (ref 0.50–1.10)
Calcium: 7.8 mg/dL — ABNORMAL LOW (ref 8.4–10.5)
Glucose, Bld: 91 mg/dL (ref 70–99)
POTASSIUM: 3 meq/L — AB (ref 3.5–5.3)
Sodium: 139 mEq/L (ref 135–145)
Total Bilirubin: 5.5 mg/dL — ABNORMAL HIGH (ref 0.2–1.2)
Total Protein: 7.6 g/dL (ref 6.0–8.3)

## 2015-05-01 NOTE — Progress Notes (Unsigned)
Patient ID: Emmit AlexandersJeane Mcfarlan, female   DOB: 04-03-1956, 59 y.o.   MRN: [id number]  [facility] with a critical albumin of 1.6.  This result was repeated and verified.  This note was forwarded to Dr. Venetia NightAmao.

## 2015-05-02 ENCOUNTER — Other Ambulatory Visit: Payer: Self-pay | Admitting: Family Medicine

## 2015-05-02 DIAGNOSIS — E876 Hypokalemia: Secondary | ICD-10-CM

## 2015-05-02 MED ORDER — POTASSIUM CHLORIDE CRYS ER 10 MEQ PO TBCR
10.0000 meq | EXTENDED_RELEASE_TABLET | Freq: Every day | ORAL | Status: DC
Start: 2015-05-02 — End: 2016-06-15

## 2015-05-08 ENCOUNTER — Telehealth: Payer: Self-pay

## 2015-05-08 NOTE — Telephone Encounter (Signed)
Nurse called patient, reached voicemail. Left message for patient to call Diane Cook at [phone].   

## 2015-05-08 NOTE — Telephone Encounter (Signed)
-----   Message from Jaclyn ShaggyEnobong Amao, MD sent at 05/02/2015  5:42 PM EDT ----- Please inform her that her liver enzymes have trended up, her platelets are still low which are a sign of worsening cirrhosis and we will try to expedite her GI referral; she also has hypokalemia which could be from the Lasix and so i have sent a prescription for Potassium to her pharmacy and her labs will be repeated at her next office visit.

## 2015-05-09 ENCOUNTER — Telehealth: Payer: Self-pay | Admitting: *Deleted

## 2015-05-09 NOTE — Telephone Encounter (Signed)
Nurse called patient, patient verified date of birth. Patient aware of elevated liver enzymes and low platelets. Patient aware of worsening cirrhosis and we will try to speed up her GI referral. Patietn aware of low potassium, which could be from lasix. Patient agrees to pick up potassium prescription and patient aware of need of repeat labs at next visit. Patient voices understanding.

## 2015-05-09 NOTE — Telephone Encounter (Signed)
Called and left patient a voice mail to return my call regarding Hep C referral. Patient needs to come in for lab work and then she will be scheduled with MD in June.

## 2015-05-09 NOTE — Telephone Encounter (Signed)
-----   Message from Enobong Amao, MD sent at 05/02/2015  5:42 PM EDT ----- Please inform her that her liver enzymes have trended up, her platelets are still low which are a sign of worsening cirrhosis and we will try to expedite her GI referral; she also has hypokalemia which could be from the Lasix and so i have sent a prescription for Potassium to her pharmacy and her labs will be repeated at her next office visit. 

## 2015-05-14 ENCOUNTER — Other Ambulatory Visit: Payer: Self-pay | Admitting: Family Medicine

## 2015-05-14 ENCOUNTER — Encounter: Payer: Self-pay | Admitting: Family Medicine

## 2015-05-14 ENCOUNTER — Ambulatory Visit: Payer: Medicaid Other | Attending: Family Medicine | Admitting: Family Medicine

## 2015-05-14 ENCOUNTER — Other Ambulatory Visit: Payer: Self-pay

## 2015-05-14 DIAGNOSIS — Z87891 Personal history of nicotine dependence: Secondary | ICD-10-CM | POA: Insufficient documentation

## 2015-05-14 DIAGNOSIS — B182 Chronic viral hepatitis C: Secondary | ICD-10-CM | POA: Insufficient documentation

## 2015-05-14 DIAGNOSIS — Z8673 Personal history of transient ischemic attack (TIA), and cerebral infarction without residual deficits: Secondary | ICD-10-CM | POA: Insufficient documentation

## 2015-05-14 DIAGNOSIS — B1710 Acute hepatitis C without hepatic coma: Secondary | ICD-10-CM

## 2015-05-14 DIAGNOSIS — F319 Bipolar disorder, unspecified: Secondary | ICD-10-CM | POA: Insufficient documentation

## 2015-05-14 DIAGNOSIS — E876 Hypokalemia: Secondary | ICD-10-CM | POA: Diagnosis not present

## 2015-05-14 DIAGNOSIS — K7030 Alcoholic cirrhosis of liver without ascites: Secondary | ICD-10-CM | POA: Insufficient documentation

## 2015-05-14 DIAGNOSIS — I5030 Unspecified diastolic (congestive) heart failure: Secondary | ICD-10-CM | POA: Diagnosis not present

## 2015-05-14 DIAGNOSIS — Z7951 Long term (current) use of inhaled steroids: Secondary | ICD-10-CM | POA: Insufficient documentation

## 2015-05-14 DIAGNOSIS — J45909 Unspecified asthma, uncomplicated: Secondary | ICD-10-CM | POA: Insufficient documentation

## 2015-05-14 DIAGNOSIS — K7460 Unspecified cirrhosis of liver: Secondary | ICD-10-CM | POA: Diagnosis not present

## 2015-05-14 DIAGNOSIS — E669 Obesity, unspecified: Secondary | ICD-10-CM | POA: Diagnosis not present

## 2015-05-14 DIAGNOSIS — E8809 Other disorders of plasma-protein metabolism, not elsewhere classified: Secondary | ICD-10-CM | POA: Diagnosis not present

## 2015-05-14 DIAGNOSIS — B1920 Unspecified viral hepatitis C without hepatic coma: Secondary | ICD-10-CM | POA: Diagnosis not present

## 2015-05-14 DIAGNOSIS — K7290 Hepatic failure, unspecified without coma: Secondary | ICD-10-CM | POA: Insufficient documentation

## 2015-05-14 DIAGNOSIS — Z9049 Acquired absence of other specified parts of digestive tract: Secondary | ICD-10-CM | POA: Diagnosis not present

## 2015-05-14 DIAGNOSIS — R188 Other ascites: Principal | ICD-10-CM

## 2015-05-14 LAB — BASIC METABOLIC PANEL
BUN: 9 mg/dL (ref 6–23)
CHLORIDE: 104 meq/L (ref 96–112)
CO2: 27 mEq/L (ref 19–32)
Calcium: 8.2 mg/dL — ABNORMAL LOW (ref 8.4–10.5)
Creat: 0.8 mg/dL (ref 0.50–1.10)
Glucose, Bld: 86 mg/dL (ref 70–99)
POTASSIUM: 3.8 meq/L (ref 3.5–5.3)
SODIUM: 135 meq/L (ref 135–145)

## 2015-05-14 NOTE — Patient Instructions (Signed)
Cirrhosis  Cirrhosis is a condition of scarring of the liver which is caused when the liver has tried repairing itself following damage. This damage may come from a previous infection such as one of the forms of hepatitis (usually hepatitis C), or the damage may come from being injured by toxins. The main toxin that causes this damage is alcohol. The scarring of the liver from use of alcohol is irreversible. That means the liver cannot return to normal even though alcohol is not used any more. The main danger of hepatitis C infection is that it may cause long-lasting (chronic) liver disease, and this also may lead to cirrhosis. This complication is progressive and irreversible.  CAUSES   Prior to available blood tests, hepatitis C could be contracted by blood transfusions. Since testing of blood has improved, this is now unlikely. This infection can also be contracted through intravenous drug use and the sharing of needles. It can also be contracted through sexual relationships. The injury caused by alcohol comes from too much use. It is not a few drinks that poison the liver, but years of misuse. Usually there will be some signs and symptoms early with scarring of the liver that suggest the development of better habits. Alcohol should never be used while using acetaminophen. A small dose of both taken together may cause irreversible damage to the liver.  HOME CARE INSTRUCTIONS   There is no specific treatment for cirrhosis. However, there are things you can do to avoid making the condition worse.  · Rest as needed.  · Eat a well-balanced diet. Your caregiver can help you with suggestions.  · Vitamin supplements including vitamins A, K, D, and thiamine can help.  · A low-salt diet, water restriction, or diuretic medicine may be needed to reduce fluid retention.  · Avoid alcohol. This can be extremely toxic if combined with acetaminophen.  · Avoid drugs which are toxic to the liver. Some of these include isoniazid,  methyldopa, acetaminophen, anabolic steroids (muscle-building drugs), erythromycin, and oral contraceptives (birth control pills). Check with your caregiver to make sure medicines you are presently taking will not be harmful.  · Periodic blood tests may be required. Follow your caregiver's advice regarding the timing of these.  · Milk thistle is an herbal remedy which does protect the liver against toxins. However, it will not help once the liver has been scarred.  SEEK MEDICAL CARE IF:  · You have increasing fatigue or weakness.  · You develop swelling of the hands, feet, legs, or face.  · You vomit bright red blood, or a coffee ground appearing material.  · You have blood in your stools, or the stools turn black and tarry.  · You have a fever.  · You develop loss of appetite, or have nausea and vomiting.  · You develop jaundice.  · You develop easy bruising or bleeding.  · You have worsening of any of the problems you are concerned about.  Document Released: 12/14/2005 Document Revised: 03/07/2012 Document Reviewed: 08/01/2008  ExitCare® Patient Information ©2015 [employer]. This information is not intended to replace advice given to you by your health care provider. Make sure you discuss any questions you have with your health care provider.

## 2015-05-14 NOTE — Progress Notes (Signed)
Patient here for follow up of cirrhosis. Patient reports feeling good. Patient denies pain at this time.

## 2015-05-14 NOTE — Progress Notes (Signed)
Subjective:    Patient ID: Diane Cook, female    DOB: 04/28/1956, 59 y.o.   MRN: [id number]  HPI   Diane Cook is a 59 year old female with a history of diastolic heart failure, hepatitis C, hepatic cirrhosis secondary to alcohol abuse, who was seen for a hospital follow-up at her last office visit. She was referred to GI for management of liver cirrhosis and  referral was transferred to infectious diseases due to her history of hepatitis C she is yet to be seen. Labs had revealed elevated bilirubin of 5.5, elevated LFTs with AST of 89, ALT of 37 and alkaline phosphatase of 131 and an albumin of 1.6. She was hypokalemic with a potassium of 3.0 and anemic with a hemoglobin of 8.8. Potassium supplementation was commenced.  She quit smoking and quit drinking alcohol 5 days ago and has no additional concerns at this time.  Past Medical History  Diagnosis Date  . Hypertension   . Arthritis   . Stroke   . Asthma   . H/O non anemic vitamin B12 deficiency   . Hepatitis C   . Alcoholism   . Family history of adverse reaction to anesthesia     sister & daughter have a hard time waking up   . Bipolar disorder     Past Surgical History  Procedure Laterality Date  . Abdominal hysterectomy    . Cholecystectomy      History   Social History  . Marital Status: Single    Spouse Name: N/A  . Number of Children: N/A  . Years of Education: N/A   Occupational History  . Not on file.   Social History Main Topics  . Smoking status: Former Smoker -- 0.25 packs/day for 40 years    Types: Cigarettes    Quit date: 05/09/2015  . Smokeless tobacco: Never Used  . Alcohol Use: No     Comment: Patient drinks 6 40oz per day, 1 pint of liquor daily; Last drink 05/09/15  . Drug Use: No  . Sexual Activity: Not on file   Other Topics Concern  . Not on file   Social History Narrative    Family History  Problem Relation Age of Onset  . Diabetes Mother   . Diabetes Daughter     No Known  Allergies  Current Outpatient Prescriptions on File Prior to Visit  Medication Sig Dispense Refill  . albuterol (PROVENTIL HFA;VENTOLIN HFA) 108 (90 BASE) MCG/ACT inhaler Inhale 2 puffs into the lungs every 6 (six) hours as needed for wheezing or shortness of breath. 1 each 1  . fluticasone (FLONASE) 50 MCG/ACT nasal spray Place 2 sprays into both nostrils daily. 16 g 1  . furosemide (LASIX) 40 MG tablet Take 1 tablet (40 mg total) by mouth daily. 30 tablet 3  . nicotine (NICODERM CQ) 7 mg/24hr patch Place 1 patch (7 mg total) onto the skin daily. 28 patch 0  . potassium chloride SA (K-DUR,KLOR-CON) 10 MEQ tablet Take 1 tablet (10 mEq total) by mouth daily. 30 tablet 1  . spironolactone (ALDACTONE) 50 MG tablet Take 1 tablet (50 mg total) by mouth daily. 30 tablet 3  . thiamine 100 MG tablet Take 1 tablet (100 mg total) by mouth daily. 30 tablet 3   No current facility-administered medications on file prior to visit.     Review of Systems  Constitutional: Negative for activity change, appetite change and fatigue.  HENT: Negative for congestion, sinus pressure and sore throat.   Eyes:  Negative for visual disturbance.  Respiratory: Negative for cough, chest tightness, shortness of breath and wheezing.   Cardiovascular: Negative for chest pain and palpitations.  Gastrointestinal: Negative for abdominal pain, constipation and abdominal distention.  Endocrine: Negative for polydipsia.  Genitourinary: Negative for dysuria and frequency.  Musculoskeletal: Negative for back pain and arthralgias.  Skin: Negative for rash.  Hematological: Negative for adenopathy. Does not bruise/bleed easily.  Psychiatric/Behavioral: Negative for suicidal ideas.         Objective: Filed Vitals:   05/14/15 0937  BP: 93/63  Pulse: 76  Temp: 97.7 F (36.5 C)  TempSrc: Oral  Resp: 18  Height: 5\' 11"  (1.803 m)  Weight: 330 lb (149.687 kg)  SpO2: 96%      Physical Exam  Constitutional: She is oriented  to person, place, and time. She appears well-developed and well-nourished. No distress.  Neck: JVD present.  Cardiovascular: Normal rate, regular rhythm and intact distal pulses.  Exam reveals no gallop.   Murmur heard. Pulmonary/Chest: Effort normal and breath sounds normal. No respiratory distress. She has no wheezes. She has no rales. She exhibits no tenderness.  Abdominal: Soft. Bowel sounds are normal. She exhibits no distension and no mass. There is no tenderness.  Musculoskeletal: Normal range of motion. She exhibits no edema or tenderness.  Neurological: She is alert and oriented to person, place, and time.  Skin: She is not diaphoretic.  Psychiatric: She has a normal mood and affect.       CMP Latest Ref Rng 04/30/2015 04/26/2015 04/25/2015  Glucose 70 - 99 mg/dL 91 16(X64(L) -  BUN 6 - 23 mg/dL 7 7 -  Creatinine 0.960.50 - 1.10 mg/dL 0.450.86 4.091.10 -  Sodium 811135 - 145 mEq/L 139 139 -  Potassium 3.5 - 5.3 mEq/L 3.0(L) 3.5 -  Chloride 96 - 112 mEq/L 105 105 -  CO2 19 - 32 mEq/L 29 27 -  Calcium 8.4 - 10.5 mg/dL 7.8(L) 7.5(L) -  Total Protein 6.0 - 8.3 g/dL 7.6 7.3 -  Total Bilirubin 0.2 - 1.2 mg/dL 5.5(H) 5.0(H) 5.3(H)  Alkaline Phos 39 - 117 U/L 131(H) 105 -  AST 0 - 37 U/L 89(H) 96(H) -  ALT 0 - 35 U/L 37(H) 39(H) -          Assessment & Plan:  59 year old female with a history of diastolic heart failure, hepatitis C, hepatic cirrhosis secondary to alcohol abuse.  Liver cirrhosis with hepatic decompensation: Child Pugh class B (score of 10) -she has hyperbilirubinemia, elevated INR, trace ascites from previous CT, hypoalbuminemia. She will need to be seen by GI for optimization of management;  referral placed at the last visit. Meanwhile she will continue spironolactone and furosemide.  Hepatitis C: Call placed to infectious disease to obtain an appointment for previsit labs and patient made aware.  Hypokalemia: Continue potassium replacement.  PCP to follow Up on: Visit to  infectious disease for management of hepatitis C and referral to GI for management of liver cirrhosis.   Disclaimer: This note was dictated with voice recognition software. Similar sounding words can inadvertently be transcribed and this note may contain transcription errors which may not have been corrected upon publication of note.

## 2015-05-15 ENCOUNTER — Telehealth: Payer: Self-pay

## 2015-05-15 ENCOUNTER — Other Ambulatory Visit: Payer: Medicaid Other

## 2015-05-15 DIAGNOSIS — B182 Chronic viral hepatitis C: Secondary | ICD-10-CM

## 2015-05-15 LAB — IRON: IRON: 175 ug/dL — AB (ref 42–145)

## 2015-05-15 LAB — HEPATITIS A ANTIBODY, TOTAL: HEP A TOTAL AB: REACTIVE — AB

## 2015-05-15 LAB — HEPATITIS B CORE ANTIBODY, TOTAL: Hep B Core Total Ab: NONREACTIVE

## 2015-05-15 NOTE — Telephone Encounter (Signed)
Nurse called patient. Patient verified date of birth. Patient aware of normal potassium and to continue taking potassium pills. Patient verbalizes understanding and has no questions at this time.

## 2015-05-15 NOTE — Telephone Encounter (Signed)
-----   Message from Jaclyn ShaggyEnobong Amao, MD sent at 05/14/2015 11:41 PM EDT ----- Please inform her that her potassium is back to normal; advised to continue potassium pills.

## 2015-05-16 LAB — ANTI-NUCLEAR AB-TITER (ANA TITER): ANA Titer 1: 1:160 {titer} — ABNORMAL HIGH

## 2015-05-16 LAB — HEPATITIS C RNA QUANTITATIVE
HCV QUANT LOG: 5.99 {Log} — AB (ref <1.18)
HCV QUANT: 974634 [IU]/mL — AB (ref <15)

## 2015-05-16 LAB — ANA: Anti Nuclear Antibody(ANA): POSITIVE — AB

## 2015-05-22 LAB — HEPATITIS C GENOTYPE

## 2015-05-24 ENCOUNTER — Other Ambulatory Visit: Payer: Self-pay

## 2015-05-24 ENCOUNTER — Telehealth: Payer: Self-pay | Admitting: General Practice

## 2015-05-24 MED ORDER — FUROSEMIDE 40 MG PO TABS: 40.0000 mg | ORAL_TABLET | Freq: Every day | ORAL | Status: DC

## 2015-05-24 NOTE — Telephone Encounter (Signed)
Nurse called patient. Phone rang multiple times then stopped ringing. Nurse called number again, phone does not ring.

## 2015-05-24 NOTE — Telephone Encounter (Signed)
Pt requesting refill on furosemide, [facility].

## 2015-05-24 NOTE — Telephone Encounter (Signed)
Refilled furosemide, 1 month supply, per verbal orders from Dr. Venetia NightAmao. [facility]. Tried to call patient, unable to reach.

## 2015-05-28 ENCOUNTER — Ambulatory Visit: Payer: Self-pay

## 2015-05-29 ENCOUNTER — Ambulatory Visit: Payer: Medicaid Other | Attending: Family Medicine | Admitting: Family Medicine

## 2015-05-29 ENCOUNTER — Encounter (HOSPITAL_COMMUNITY): Payer: Self-pay | Admitting: *Deleted

## 2015-05-29 ENCOUNTER — Emergency Department (HOSPITAL_COMMUNITY)
Admission: EM | Admit: 2015-05-29 | Discharge: 2015-05-30 | Disposition: A | Discharge status: Home / Self Care | Payer: Medicaid Other | Attending: Emergency Medicine | Admitting: Emergency Medicine

## 2015-05-29 ENCOUNTER — Encounter: Payer: Self-pay | Admitting: Family Medicine

## 2015-05-29 VITALS — BP 114/72 | HR 83 | Temp 97.7°F | Resp 16 | Ht 71.5 in | Wt 324.0 lb

## 2015-05-29 DIAGNOSIS — J45909 Unspecified asthma, uncomplicated: Secondary | ICD-10-CM | POA: Diagnosis not present

## 2015-05-29 DIAGNOSIS — Z8619 Personal history of other infectious and parasitic diseases: Secondary | ICD-10-CM | POA: Insufficient documentation

## 2015-05-29 DIAGNOSIS — Z7951 Long term (current) use of inhaled steroids: Secondary | ICD-10-CM | POA: Diagnosis not present

## 2015-05-29 DIAGNOSIS — Z8673 Personal history of transient ischemic attack (TIA), and cerebral infarction without residual deficits: Secondary | ICD-10-CM | POA: Insufficient documentation

## 2015-05-29 DIAGNOSIS — E860 Dehydration: Secondary | ICD-10-CM | POA: Insufficient documentation

## 2015-05-29 DIAGNOSIS — K769 Liver disease, unspecified: Secondary | ICD-10-CM | POA: Insufficient documentation

## 2015-05-29 DIAGNOSIS — Z8659 Personal history of other mental and behavioral disorders: Secondary | ICD-10-CM | POA: Insufficient documentation

## 2015-05-29 DIAGNOSIS — R1012 Left upper quadrant pain: Secondary | ICD-10-CM | POA: Diagnosis present

## 2015-05-29 DIAGNOSIS — F1020 Alcohol dependence, uncomplicated: Secondary | ICD-10-CM | POA: Insufficient documentation

## 2015-05-29 DIAGNOSIS — K7460 Unspecified cirrhosis of liver: Secondary | ICD-10-CM

## 2015-05-29 DIAGNOSIS — M25561 Pain in right knee: Secondary | ICD-10-CM

## 2015-05-29 DIAGNOSIS — I10 Essential (primary) hypertension: Secondary | ICD-10-CM | POA: Insufficient documentation

## 2015-05-29 DIAGNOSIS — R188 Other ascites: Secondary | ICD-10-CM

## 2015-05-29 DIAGNOSIS — Z79899 Other long term (current) drug therapy: Secondary | ICD-10-CM | POA: Diagnosis not present

## 2015-05-29 DIAGNOSIS — R109 Unspecified abdominal pain: Secondary | ICD-10-CM

## 2015-05-29 DIAGNOSIS — Z87891 Personal history of nicotine dependence: Secondary | ICD-10-CM | POA: Diagnosis not present

## 2015-05-29 DIAGNOSIS — E669 Obesity, unspecified: Secondary | ICD-10-CM | POA: Insufficient documentation

## 2015-05-29 DIAGNOSIS — K7030 Alcoholic cirrhosis of liver without ascites: Secondary | ICD-10-CM | POA: Insufficient documentation

## 2015-05-29 DIAGNOSIS — D696 Thrombocytopenia, unspecified: Secondary | ICD-10-CM | POA: Diagnosis not present

## 2015-05-29 MED ORDER — TRAMADOL HCL 50 MG PO TABS: 50.0000 mg | ORAL_TABLET | Freq: Every evening | ORAL | Status: DC | PRN

## 2015-05-29 NOTE — Progress Notes (Signed)
   Subjective:    Patient ID: Diane Cook, female    DOB: 02-03-1956, 59 y.o.   MRN: [id number] CC: meet PCP, R knee pain x 4 days  HPI  1. R knee pain: x 4 days. Hit knee on window sill at night. No hx of knee pain. Mild swelling. No redness, no twisting, no falls. Taking naproxen for pain. Patient has known liver disease with low platelets and anemia.   2. Liver disease: awaiting appt with GI. No active bleeding. No GI upset. Took NSAID for knee pain.   Soc Hx: former smoker, quit  Review of Systems  Constitutional: Negative for fever and chills.  Musculoskeletal: Positive for arthralgias and gait problem.      Objective:   Physical Exam BP 114/72 mmHg  Pulse 83  Temp(Src) 97.7 F (36.5 C) (Oral)  Resp 16  Ht 5' 11.5" (1.816 m)  Wt 324 lb (146.965 kg)  BMI 44.56 kg/m2  SpO2 98% General appearance: alert, cooperative, no distress and morbidly obese  R Knee: Normal to inspection with no erythema or effusion or obvious bony abnormalities. Palpation  with no warmth or joint line tenderness or patellar tenderness. There is tenderness allong distal insertion of patellar tendon.  ROM normal in flexion and extension and lower leg rotation. Ligaments with solid consistent endpoints including ACL, PCL, LCL, MCL. Non painful patellar compression.      Assessment & Plan:

## 2015-05-29 NOTE — ED Provider Notes (Signed)
CSN: [id number]     Arrival date & time 05/29/15  2339 History  This chart was scribed for Diane Severin, MD by Diane Cook, ED Scribe. This patient was seen in room Diane Cook and the patient's care was started at 11:59 PM.    Chief Complaint  Patient presents with  . Abdominal Pain   The history is provided by the patient and a relative. No language interpreter was used.    HPI Comments: Diane Cook is a 59 y.o. female brought in by ambulance with past medical history of HTN, Hep C, alcoholism with cirrhosis of the liver, who presents to the Emergency Department complaining of intermittent "sharp and cramping" abdominal pain with occasional chest pain beginning within the past hour. Patient's Diane Cook explains that patient initially experienced right hand cramping around 23:30 tonight; patient then went to use the restroom, had a bowel movement with blood in her stool, and returned with complaints of upper abdominal pain (worst in the LUQ) and right-sided chest pain. Diane Cook reports that patient was "rolling around on the floor" in pain with some associated SOB.   Diane Cook reports a history of anemia. Diane Cook denies history of heart failure, pancreatitis, diverticulitis. Diane Cook has never before had a colonoscopy. Patient just saw a specialist for her cirrhosis last week; Diane Cook was told her potassium was low at that time. Diane Cook was seen again at the [facility] clinic today.   Patient is a daily drinker ("beer, no other fluids").   Past Medical History  Diagnosis Date  . Hypertension Dx 2004  . Arthritis Dx 2014  . Stroke Dx 1997  . Asthma Dx 2001  . H/O non anemic vitamin B12 deficiency   . Hepatitis C Dx 2011  . Alcoholism   . Family history of adverse reaction to anesthesia     sister & Diane Cook have a hard time waking up   . Bipolar disorder Dx1996   Past Surgical History  Procedure Laterality Date  . Abdominal hysterectomy    . Cholecystectomy     Family History  Problem Relation Age  of Onset  . Diabetes Mother   . Diabetes Diane Cook    History  Substance Use Topics  . Smoking status: Former Smoker -- 0.25 packs/day for 40 years    Types: Cigarettes    Quit date: 05/09/2015  . Smokeless tobacco: Never Used  . Alcohol Use: No     Comment: Patient drinks 6 40oz per day, 1 pint of liquor daily; Last drink 05/09/15   OB History    No data available     Review of Systems  Respiratory: Positive for shortness of breath.   Cardiovascular: Positive for chest pain.  Gastrointestinal: Positive for abdominal pain and blood in stool.  All other systems reviewed and are negative.  Allergies  Review of patient's allergies indicates no known allergies.  Home Medications   Prior to Admission medications   Medication Sig Start Date End Date Taking? Authorizing Provider  albuterol (PROVENTIL HFA;VENTOLIN HFA) 108 (90 BASE) MCG/ACT inhaler Inhale 2 puffs into the lungs every 6 (six) hours as needed for wheezing or shortness of breath. 04/30/15   Jaclyn Shaggy, MD  fluticasone (FLONASE) 50 MCG/ACT nasal spray Place 2 sprays into both nostrils daily. 11/18/13   Robyn M Hess, PA-C  furosemide (LASIX) 40 MG tablet Take 1 tablet (40 mg total) by mouth daily. 05/24/15   Jaclyn Shaggy, MD  nicotine (NICODERM CQ) 7 mg/24hr patch Place 1 patch (7 mg total) onto the skin daily.  04/30/15   Jaclyn ShaggyEnobong Amao, MD  potassium chloride SA (K-DUR,KLOR-CON) 10 MEQ tablet Take 1 tablet (10 mEq total) by mouth daily. 05/02/15   Jaclyn ShaggyEnobong Amao, MD  spironolactone (ALDACTONE) 50 MG tablet Take 1 tablet (50 mg total) by mouth daily. 04/26/15   Albertine GratesFang Xu, MD  thiamine 100 MG tablet Take 1 tablet (100 mg total) by mouth daily. 04/26/15   Albertine GratesFang Xu, MD  traMADol (ULTRAM) 50 MG tablet Take 1 tablet (50 mg total) by mouth at bedtime as needed. 05/29/15   Josalyn Funches, MD   BP 94/48 mmHg  Pulse 76  Temp(Src) 97.5 F (36.4 C) (Oral)  Resp 26  Wt 324 lb (146.965 kg)  SpO2 100% Physical Exam  Constitutional: Diane Cook is  oriented to person, place, and time. Diane Cook appears well-developed and well-nourished. No distress.  Obese female, uncomfortable appearing.  Hypotension noted  HENT:  Head: Normocephalic and atraumatic.  Right Ear: External ear normal.  Left Ear: External ear normal.  Nose: Nose normal.  Dry mucous membranes  Eyes: EOM are normal. Pupils are equal, round, and reactive to light.  Neck: Normal range of motion. Neck supple. No JVD present.  Cardiovascular: Normal rate, regular rhythm and normal heart sounds.  Exam reveals no gallop and no friction rub.   No murmur heard. Pulmonary/Chest: Effort normal and breath sounds normal. No respiratory distress. Diane Cook has no wheezes. Diane Cook has no rales.  Abdominal: Soft. Bowel sounds are normal. Diane Cook exhibits no mass. There is tenderness (LUQ). There is no rebound and no guarding.  Musculoskeletal: Normal range of motion. Diane Cook exhibits no edema or tenderness.  Moves all extremities normally.   Neurological: Diane Cook is alert and oriented to person, place, and time. Diane Cook displays normal reflexes. No cranial nerve deficit. Diane Cook exhibits normal muscle tone. Coordination normal.  Skin: Skin is warm and dry. No rash noted.  Psychiatric: Diane Cook has a normal mood and affect. Her behavior is normal.  Nursing note and vitals reviewed.   ED Course  Procedures   DIAGNOSTIC STUDIES: Oxygen Saturation is 100% on RA, normal by my interpretation.    COORDINATION OF CARE: 12:06 AM Discussed treatment plan with pt at bedside and pt agreed to plan.   Labs Review Labs Reviewed  CBC WITH DIFFERENTIAL/PLATELET - Abnormal; Notable for the following:    RBC 2.48 (*)    Hemoglobin 9.4 (*)    HCT 26.7 (*)    MCV 107.7 (*)    MCH 37.9 (*)    RDW 16.5 (*)    Platelets 70 (*)    Neutrophils Relative % 28 (*)    Lymphocytes Relative 56 (*)    Lymphs Abs 4.4 (*)    Eosinophils Relative 7 (*)    All other components within normal limits  COMPREHENSIVE METABOLIC PANEL - Abnormal;  Notable for the following:    Sodium 133 (*)    Creatinine, Ser 1.28 (*)    Calcium 8.1 (*)    Albumin 1.5 (*)    AST 80 (*)    Total Bilirubin 3.7 (*)    GFR calc non Af Amer 44 (*)    GFR calc Af Amer 51 (*)    All other components within normal limits  I-STAT CG4 LACTIC ACID, ED - Abnormal; Notable for the following:    Lactic Acid, Venous 2.42 (*)    All other components within normal limits  POC OCCULT BLOOD, ED - Abnormal; Notable for the following:    Fecal Occult Bld POSITIVE (*)  All other components within normal limits  LIPASE, BLOOD  AMMONIA  I-STAT TROPOININ, ED  SAMPLE TO BLOOD BANK    Imaging Review Ct Abdomen Pelvis W Contrast  05/30/2015   CLINICAL DATA:  Acute onset of generalized abdominal pain, most prominent at the left upper quadrant. Blood in stool. Initial encounter.  EXAM: CT ABDOMEN AND PELVIS WITH CONTRAST  TECHNIQUE: Multidetector CT imaging of the abdomen and pelvis was performed using the standard protocol following bolus administration of intravenous contrast.  CONTRAST:  OMNIPAQUE IOHEXOL 300 MG/ML  SOLN  COMPARISON:  Abdominal ultrasound performed 04/24/2015  FINDINGS: The visualized lung bases are clear. Diffuse coronary artery calcifications are seen.  Prominent intrahepatic biliary ductal dilatation is seen. The mildly nodular contour of the liver is compatible with cirrhosis. The patient is status post cholecystectomy. Stones or calcification are noted at the hepatic hilum. This may reflect retained stones. Associated mildly prominent nodes are seen at the hepatic hilum, measuring up to 1.0 cm in short axis. The common bile duct remains normal in caliber. The spleen is grossly unremarkable.  The pancreas and adrenal glands are within normal limits. Prominent collateral vessels are noted along the anterior abdominal wall. Mild soft tissue edema is noted along the lateral abdominal wall bilaterally. There is mild skin thickening about the umbilicus  and surrounding anterior abdominal wall. Minimal gastric and esophageal varices are seen.  A 3 mm nonobstructing stone is noted at the interpole region of the left kidney. The kidneys are otherwise unremarkable. There is no evidence of hydronephrosis. No obstructing ureteral stones are identified. No perinephric stranding is appreciated.  No free fluid is identified. The small bowel is unremarkable in appearance. The stomach is within normal limits. No acute vascular abnormalities are seen. Scattered calcification is noted along the abdominal aorta and its branches, including along the superior mesenteric artery.  The appendix is normal in caliber, without evidence for appendicitis. The colon is unremarkable in appearance.  The bladder is decompressed and not well assessed. The patient is status post hysterectomy. No suspicious adnexal masses are seen. No inguinal lymphadenopathy is seen.  No acute osseous abnormalities are identified. Mild vacuum phenomenon is noted at L5-S1. Underlying facet disease is noted.  IMPRESSION: 1. No acute abnormality seen to explain the patient's symptoms. 2. Findings of hepatic cirrhosis. Prominent collateral vessels along the anterior abdominal wall. Minimal gastric and esophageal varices seen. 3. Prominence of the intrahepatic biliary ducts, status post cholecystectomy. Stones or calcification noted at the hepatic hilum. This could reflect retained stones, or subsequent postoperative calcification. The common bile duct remains normal in caliber. 4. Diffuse coronary artery calcifications seen. 5. 3 mm nonobstructing stone at the interpole region of the left kidney. 6. Scattered calcification along the abdominal aorta and its branches, including along the superior mesenteric artery.   Electronically Signed   By: Roanna Raider M.D.   On: 05/30/2015 02:58     EKG Interpretation   Date/Time:  Wednesday May 29 2015 23:45:30 EDT Ventricular Rate:  78 PR Interval:  148 QRS  Duration: 101 QT Interval:  410 QTC Calculation: 467 R Axis:   25 Text Interpretation:  Sinus rhythm Low voltage, precordial leads Baseline  wander in lead(s) I aVR V6 Confirmed by Johntavius Shepard  MD, Rowin Bayron ([id number]) on  05/30/2015 1:36:04 AM       CRITICAL CARE Performed by: Olivia Mackie Total critical care time: 30 min Critical care time was exclusive of separately billable procedures and treating other patients. Critical care  was necessary to treat or prevent imminent or life-threatening deterioration. Critical care was time spent personally by me on the following activities: development of treatment plan with patient and/or surrogate as well as nursing, discussions with consultants, evaluation of patient's response to treatment, examination of patient, obtaining history from patient or surrogate, ordering and performing treatments and interventions, ordering and review of laboratory studies, ordering and review of radiographic studies, pulse oximetry and re-evaluation of patient's condition.   MDM   Final diagnoses:  Abdominal pain, unspecified abdominal location  Dehydration  Alcoholism  Alcoholic cirrhosis of liver without ascites   59 year old female with acute onset of diffuse abdominal pain with some radiation into her chest, history of cirrhosis, ongoing alcohol abuse.  Patient presents hypotensive.  No new medications, has actually been out of her Lasix for the last few days, reportedly eating and drinking well.  Has been drinking tonight.  EKG without ischemia.  Initial troponin negative.  Diffuse tenderness to palpation, worse in left upper quadrant with some blood in the stool, the patient admits to her hemorrhoids have been bothering her.  Plan for IV fluids, CT scan.  Patient feeling much better, abdominal pain has resolved.  Blood pressure has improved.  CT scan without specific cause for pain.  No further chest pain.  Patient counseled on stopping alcohol abuse and close follow-up with  primary care doctor, as well as gastroenterologist.     Diane Severin, MD 05/30/15 [phone]

## 2015-05-29 NOTE — Assessment & Plan Note (Signed)
A: R knee pain due to contusion   P: Compression with ACE bandage Elevation of leg Ice pack for 15 minutes 2-3 times per day Tramadol at night as needed for pain  Do not take: aspirin, aleve, naproxen due to anemia (low hemoglobin) and low platelets

## 2015-05-29 NOTE — ED Notes (Signed)
Pt in via EMS c/o upper abdominal pain, swelling to bilateral legs, and abdominal distention, pt has been out of her lasix for the last 5 days and symptoms have gotten progressively worse over those days- upon arrival to ED pt BP dropped from 130/72 to 85/43, IV placed PTA

## 2015-05-29 NOTE — Progress Notes (Signed)
Establish Care Complaining of Rt knee pain x 4 days  No Hx injury

## 2015-05-29 NOTE — Assessment & Plan Note (Signed)
CBC today for anemia and thrombocytopenia

## 2015-05-29 NOTE — Patient Instructions (Addendum)
Mrs. Diane Cook,  Thank you for coming in today. It was a pleasure meeting you. I look forward to being your primary doctor.  1. R knee pain: Compression with ACE bandage Elevation of leg Ice pack for 15 minutes 2-3 times per day Tramadol at night as needed for pain  Do not take: aspirin, aleve, naproxen due to anemia (low hemoglobin) and low platelets   F/u in 6 weeks for knee pain   Dr. Armen PickupFunches

## 2015-05-30 ENCOUNTER — Encounter (HOSPITAL_COMMUNITY): Payer: Self-pay

## 2015-05-30 ENCOUNTER — Emergency Department (HOSPITAL_COMMUNITY): Payer: Medicaid Other

## 2015-05-30 LAB — CBC WITH DIFFERENTIAL/PLATELET
BASOS PCT: 0 % (ref 0–1)
Basophils Absolute: 0 10*3/uL (ref 0.0–0.1)
Eosinophils Absolute: 0.6 10*3/uL (ref 0.0–0.7)
Eosinophils Relative: 7 % — ABNORMAL HIGH (ref 0–5)
HCT: 26.7 % — ABNORMAL LOW (ref 36.0–46.0)
Hemoglobin: 9.4 g/dL — ABNORMAL LOW (ref 12.0–15.0)
LYMPHS ABS: 4.4 10*3/uL — AB (ref 0.7–4.0)
LYMPHS PCT: 56 % — AB (ref 12–46)
MCH: 37.9 pg — ABNORMAL HIGH (ref 26.0–34.0)
MCHC: 35.2 g/dL (ref 30.0–36.0)
MCV: 107.7 fL — AB (ref 78.0–100.0)
MONO ABS: 0.7 10*3/uL (ref 0.1–1.0)
Monocytes Relative: 9 % (ref 3–12)
NEUTROS ABS: 2.2 10*3/uL (ref 1.7–7.7)
Neutrophils Relative %: 28 % — ABNORMAL LOW (ref 43–77)
Platelets: 70 10*3/uL — ABNORMAL LOW (ref 150–400)
RBC: 2.48 MIL/uL — ABNORMAL LOW (ref 3.87–5.11)
RDW: 16.5 % — ABNORMAL HIGH (ref 11.5–15.5)
WBC: 7.9 10*3/uL (ref 4.0–10.5)

## 2015-05-30 LAB — CBC
HCT: 28 % — ABNORMAL LOW (ref 36.0–46.0)
Hemoglobin: 9.9 g/dL — ABNORMAL LOW (ref 12.0–15.0)
MCH: 37.8 pg — AB (ref 26.0–34.0)
MCHC: 35.4 g/dL (ref 30.0–36.0)
MCV: 106.9 fL — AB (ref 78.0–100.0)
Platelets: 77 10*3/uL — ABNORMAL LOW (ref 150–400)
RBC: 2.62 MIL/uL — AB (ref 3.87–5.11)
RDW: 16.7 % — ABNORMAL HIGH (ref 11.5–15.5)
WBC: 5.8 10*3/uL (ref 4.0–10.5)

## 2015-05-30 LAB — COMPREHENSIVE METABOLIC PANEL
ALT: 37 U/L (ref 14–54)
AST: 80 U/L — ABNORMAL HIGH (ref 15–41)
Albumin: 1.5 g/dL — ABNORMAL LOW (ref 3.5–5.0)
Alkaline Phosphatase: 105 U/L (ref 38–126)
Anion gap: 6 (ref 5–15)
BILIRUBIN TOTAL: 3.7 mg/dL — AB (ref 0.3–1.2)
BUN: 11 mg/dL (ref 6–20)
CO2: 23 mmol/L (ref 22–32)
CREATININE: 1.28 mg/dL — AB (ref 0.44–1.00)
Calcium: 8.1 mg/dL — ABNORMAL LOW (ref 8.9–10.3)
Chloride: 104 mmol/L (ref 101–111)
GFR calc Af Amer: 51 mL/min — ABNORMAL LOW (ref >60)
GFR, EST NON AFRICAN AMERICAN: 44 mL/min — AB (ref >60)
Glucose, Bld: 83 mg/dL (ref 65–99)
Potassium: 3.6 mmol/L (ref 3.5–5.1)
SODIUM: 133 mmol/L — AB (ref 135–145)
TOTAL PROTEIN: 8.1 g/dL (ref 6.5–8.1)

## 2015-05-30 LAB — I-STAT CG4 LACTIC ACID, ED: Lactic Acid, Venous: 2.42 mmol/L (ref 0.5–2.0)

## 2015-05-30 LAB — AMMONIA: Ammonia: 35 umol/L (ref 9–35)

## 2015-05-30 LAB — POC OCCULT BLOOD, ED: Fecal Occult Bld: POSITIVE — AB

## 2015-05-30 LAB — I-STAT TROPONIN, ED: Troponin i, poc: 0.01 ng/mL (ref 0.00–0.08)

## 2015-05-30 LAB — LIPASE, BLOOD: Lipase: 34 U/L (ref 22–51)

## 2015-05-30 LAB — SAMPLE TO BLOOD BANK

## 2015-05-30 MED ORDER — IOHEXOL 300 MG/ML  SOLN
100.0000 mL | Freq: Once | INTRAMUSCULAR | Status: AC | PRN
  Administered 2015-05-30: 100 mL via INTRAVENOUS

## 2015-05-30 MED ORDER — SODIUM CHLORIDE 0.9 % IV BOLUS (SEPSIS)
1000.0000 mL | Freq: Once | INTRAVENOUS | Status: AC
  Administered 2015-05-30: 1000 mL via INTRAVENOUS

## 2015-05-30 MED ORDER — FENTANYL CITRATE (PF) 100 MCG/2ML IJ SOLN
50.0000 ug | Freq: Once | INTRAMUSCULAR | Status: AC
  Administered 2015-05-30: 50 ug via INTRAVENOUS
  Filled 2015-05-30: qty 2

## 2015-05-30 NOTE — ED Notes (Signed)
Lesly DukesRose Mary [phone] (daughter)

## 2015-05-30 NOTE — Discharge Instructions (Signed)
It is important for you to stop drinking.  Slowly taper off your alcohol intake over the next week.  Follow-up with your Dr. for recheck in 2-3 days.  Return to the emergency department for worsening condition or new concerning symptoms.    Abdominal Pain Many things can cause abdominal pain. Usually, abdominal pain is not caused by a disease and will improve without treatment. It can often be observed and treated at home. Your health care provider will do a physical exam and possibly order blood tests and X-rays to help determine the seriousness of your pain. However, in many cases, more time must pass before a clear cause of the pain can be found. Before that point, your health care provider may not know if you need more testing or further treatment. HOME CARE INSTRUCTIONS  Monitor your abdominal pain for any changes. The following actions may help to alleviate any discomfort you are experiencing:  Only take over-the-counter or prescription medicines as directed by your health care provider.  Do not take laxatives unless directed to do so by your health care provider.  Try a clear liquid diet (broth, tea, or water) as directed by your health care provider. Slowly move to a bland diet as tolerated. SEEK MEDICAL CARE IF:  You have unexplained abdominal pain.  You have abdominal pain associated with nausea or diarrhea.  You have pain when you urinate or have a bowel movement.  You experience abdominal pain that wakes you in the night.  You have abdominal pain that is worsened or improved by eating food.  You have abdominal pain that is worsened with eating fatty foods.  You have a fever. SEEK IMMEDIATE MEDICAL CARE IF:   Your pain does not go away within 2 hours.  You keep throwing up (vomiting).  Your pain is felt only in portions of the abdomen, such as the right side or the left lower portion of the abdomen.  You pass bloody or black tarry stools. MAKE SURE YOU:  Understand  these instructions.   Will watch your condition.   Will get help right away if you are not doing well or get worse.  Document Released: 09/23/2005 Document Revised: 12/19/2013 Document Reviewed: 08/23/2013 [facility] Patient Information 2015 [place], [place]. This information is not intended to replace advice given to you by your health care provider. Make sure you discuss any questions you have with your health care provider.  Alcohol Use Disorder Alcohol use disorder is a mental disorder. It is not a one-time incident of heavy drinking. Alcohol use disorder is the excessive and uncontrollable use of alcohol over time that leads to problems with functioning in one or more areas of daily living. People with this disorder risk harming themselves and others when they drink to excess. Alcohol use disorder also can cause other mental disorders, such as mood and anxiety disorders, and serious physical problems. People with alcohol use disorder often misuse other drugs.  Alcohol use disorder is common and widespread. Some people with this disorder drink alcohol to cope with or escape from negative life events. Others drink to relieve chronic pain or symptoms of mental illness. People with a family history of alcohol use disorder are at higher risk of losing control and using alcohol to excess.  SYMPTOMS  Signs and symptoms of alcohol use disorder may include the following:   Consumption ofalcohol inlarger amounts or over a longer period of time than intended.  Multiple unsuccessful attempts to cutdown or control alcohol use.  A great deal of time spent obtaining alcohol, using alcohol, or recovering from the effects of alcohol (hangover).  A strong desire or urge to use alcohol (cravings).   Continued use of alcohol despite problems at work, school, or home because of alcohol use.   Continued use of alcohol despite problems in relationships because of alcohol use.  Continued use of alcohol  in situations when it is physically hazardous, such as driving a car.  Continued use of alcohol despite awareness of a physical or psychological problem that is likely related to alcohol use. Physical problems related to alcohol use can involve the brain, heart, liver, stomach, and intestines. Psychological problems related to alcohol use include intoxication, depression, anxiety, psychosis, delirium, and dementia.   The need for increased amounts of alcohol to achieve the same desired effect, or a decreased effect from the consumption of the same amount of alcohol (tolerance).  Withdrawal symptoms upon reducing or stopping alcohol use, or alcohol use to reduce or avoid withdrawal symptoms. Withdrawal symptoms include:  Racing heart.  Hand tremor.  Difficulty sleeping.  Nausea.  Vomiting.  Hallucinations.  Restlessness.  Seizures. DIAGNOSIS Alcohol use disorder is diagnosed through an assessment by your health care provider. Your health care provider may start by asking three or four questions to screen for excessive or problematic alcohol use. To confirm a diagnosis of alcohol use disorder, at least two symptoms must be present within a 2650-month period. The severity of alcohol use disorder depends on the number of symptoms:  Mild--two or three.  Moderate--four or five.  Severe--six or more. Your health care provider may perform a physical exam or use results from lab tests to see if you have physical problems resulting from alcohol use. Your health care provider may refer you to a mental health professional for evaluation. TREATMENT  Some people with alcohol use disorder are able to reduce their alcohol use to low-risk levels. Some people with alcohol use disorder need to quit drinking alcohol. When necessary, mental health professionals with specialized training in substance use treatment can help. Your health care provider can help you decide how severe your alcohol use disorder  is and what type of treatment you need. The following forms of treatment are available:   Detoxification. Detoxification involves the use of prescription medicines to prevent alcohol withdrawal symptoms in the first week after quitting. This is important for people with a history of symptoms of withdrawal and for heavy drinkers who are likely to have withdrawal symptoms. Alcohol withdrawal can be dangerous and, in severe cases, cause death. Detoxification is usually provided in a hospital or in-patient substance use treatment facility.  Counseling or talk therapy. Talk therapy is provided by substance use treatment counselors. It addresses the reasons people use alcohol and ways to keep them from drinking again. The goals of talk therapy are to help people with alcohol use disorder find healthy activities and ways to cope with life stress, to identify and avoid triggers for alcohol use, and to handle cravings, which can cause relapse.  Medicines.Different medicines can help treat alcohol use disorder through the following actions:  Decrease alcohol cravings.  Decrease the positive reward response felt from alcohol use.  Produce an uncomfortable physical reaction when alcohol is used (aversion therapy).  Support groups. Support groups are run by people who have quit drinking. They provide emotional support, advice, and guidance. These forms of treatment are often combined. Some people with alcohol use disorder benefit from intensive combination treatment provided by  specialized substance use treatment centers. Both inpatient and outpatient treatment programs are available. Document Released: 01/21/2005 Document Revised: 04/30/2014 Document Reviewed: 03/23/2013 [facility] Patient Information 2015 [place], [place]. This information is not intended to replace advice given to you by your health care provider. Make sure you discuss any questions you have with your health care  provider.  Cirrhosis Cirrhosis is a condition of scarring of the liver which is caused when the liver has tried repairing itself following damage. This damage may come from a previous infection such as one of the forms of hepatitis (usually hepatitis C), or the damage may come from being injured by toxins. The main toxin that causes this damage is alcohol. The scarring of the liver from use of alcohol is irreversible. That means the liver cannot return to normal even though alcohol is not used any more. The main danger of hepatitis C infection is that it may cause long-lasting (chronic) liver disease, and this also may lead to cirrhosis. This complication is progressive and irreversible. CAUSES  Prior to available blood tests, hepatitis C could be contracted by blood transfusions. Since testing of blood has improved, this is now unlikely. This infection can also be contracted through intravenous drug use and the sharing of needles. It can also be contracted through sexual relationships. The injury caused by alcohol comes from too much use. It is not a few drinks that poison the liver, but years of misuse. Usually there will be some signs and symptoms early with scarring of the liver that suggest the development of better habits. Alcohol should never be used while using acetaminophen. A small dose of both taken together may cause irreversible damage to the liver. HOME CARE INSTRUCTIONS  There is no specific treatment for cirrhosis. However, there are things you can do to avoid making the condition worse.  Rest as needed.  Eat a well-balanced diet. Your caregiver can help you with suggestions.  Vitamin supplements including vitamins A, K, D, and thiamine can help.  A low-salt diet, water restriction, or diuretic medicine may be needed to reduce fluid retention.  Avoid alcohol. This can be extremely toxic if combined with acetaminophen.  Avoid drugs which are toxic to the liver. Some of these include  isoniazid, methyldopa, acetaminophen, anabolic steroids (muscle-building drugs), erythromycin, and oral contraceptives (birth control pills). Check with your caregiver to make sure medicines you are presently taking will not be harmful.  Periodic blood tests may be required. Follow your caregiver's advice regarding the timing of these.  Milk thistle is an herbal remedy which does protect the liver against toxins. However, it will not help once the liver has been scarred. SEEK MEDICAL CARE IF:  You have increasing fatigue or weakness.  You develop swelling of the hands, feet, legs, or face.  You vomit bright red blood, or a coffee ground appearing material.  You have blood in your stools, or the stools turn black and tarry.  You have a fever.  You develop loss of appetite, or have nausea and vomiting.  You develop jaundice.  You develop easy bruising or bleeding.  You have worsening of any of the problems you are concerned about. Document Released: 12/14/2005 Document Revised: 03/07/2012 Document Reviewed: 08/01/2008 [facility] Patient Information 2015 [place], [place]. This information is not intended to replace advice given to you by your health care provider. Make sure you discuss any questions you have with your health care provider.  Dehydration, Adult Dehydration means your body does not have as much fluid  as it needs. Your kidneys, brain, and heart will not work properly without the right amount of fluids and salt.  HOME CARE  Ask your doctor how to replace body fluid losses (rehydrate).  Drink enough fluids to keep your pee (urine) clear or pale yellow.  Drink small amounts of fluids often if you feel sick to your stomach (nauseous) or throw up (vomit).  Eat like you normally do.  Avoid:  Foods or drinks high in sugar.  Bubbly (carbonated) drinks.  Juice.  Very hot or cold fluids.  Drinks with caffeine.  Fatty, greasy foods.  Alcohol.  Tobacco.  Eating too  much.  Gelatin desserts.  Wash your hands to avoid spreading germs (bacteria, viruses).  Only take medicine as told by your doctor.  Keep all doctor visits as told. GET HELP RIGHT AWAY IF:   You cannot drink something without throwing up.  You get worse even with treatment.  Your vomit has blood in it or looks greenish.  Your poop (stool) has blood in it or looks black and tarry.  You have not peed in 6 to 8 hours.  You pee a small amount of very dark pee.  You have a fever.  You pass out (faint).  You have belly (abdominal) pain that gets worse or stays in one spot (localizes).  You have a rash, stiff neck, or bad headache.  You get easily annoyed, sleepy, or are hard to wake up.  You feel weak, dizzy, or very thirsty. MAKE SURE YOU:   Understand these instructions.  Will watch your condition.  Will get help right away if you are not doing well or get worse. Document Released: 10/10/2009 Document Revised: 03/07/2012 Document Reviewed: 08/03/2011 [facility] Patient Information 2015 [place], [place]. This information is not intended to replace advice given to you by your health care provider. Make sure you discuss any questions you have with your health care provider.

## 2015-06-10 ENCOUNTER — Encounter: Payer: Medicaid Other | Admitting: Internal Medicine

## 2015-06-12 ENCOUNTER — Ambulatory Visit: Payer: Medicaid Other | Attending: Family Medicine | Admitting: Family Medicine

## 2015-06-12 VITALS — BP 111/74 | HR 85 | Temp 97.8°F | Resp 16 | Ht 71.5 in | Wt 334.0 lb

## 2015-06-12 DIAGNOSIS — Z0000 Encounter for general adult medical examination without abnormal findings: Secondary | ICD-10-CM | POA: Insufficient documentation

## 2015-06-12 DIAGNOSIS — M25561 Pain in right knee: Secondary | ICD-10-CM | POA: Diagnosis not present

## 2015-06-12 DIAGNOSIS — K7030 Alcoholic cirrhosis of liver without ascites: Secondary | ICD-10-CM | POA: Insufficient documentation

## 2015-06-12 MED ORDER — METHYLPREDNISOLONE ACETATE 40 MG/ML IJ SUSP
40.0000 mg | Freq: Once | INTRAMUSCULAR | Status: AC
  Administered 2015-06-12: 40 mg via INTRA_ARTICULAR

## 2015-06-12 NOTE — Assessment & Plan Note (Signed)
A: due for mammogram P: screening mammogram ordered

## 2015-06-12 NOTE — Assessment & Plan Note (Signed)
A; inferior R knee pain following injury, no improvement P: B/l knee x-ray to assess for arthritis Corticosteroid injection into R knee

## 2015-06-12 NOTE — Progress Notes (Signed)
   Subjective:    Patient ID: Diane Cook, female    DOB: [birth year], 59 y.o.   MRN: [id number] CC: R knee pain  HPI  59 yo F with hep C, Cirrhosis, ETOH dependence   1. R knee pain: persistent pain following hitting knee on window sill on 05/27/15. No fall. No joint swelling. Pain is sharp and achy. Pain in inferior knee. No L knee pain.   Soc Hx: ETOH  drinker  Review of Systems  Constitutional: Negative for fever and chills.  Musculoskeletal: Positive for arthralgias.  Skin: Negative for rash and wound.       Objective:   Physical Exam BP 111/74 mmHg  Pulse 85  Temp(Src) 97.8 F (36.6 C) (Oral)  Resp 16  Ht 5' 11.5" (1.816 m)  Wt 334 lb (151.501 kg)  BMI 45.94 kg/m2  SpO2 98% General appearance: alert, cooperative, no distress and morbidly obese  R knee: TTP inferior patella. No swelling or erythema. Decreased ROM with pain in extension.   After obtaining informed consent and cleaning the skin using iodine and alcohol a  steroid injection was performed at R knee using 1% plain Lidocaine and 40 mg of Depo Medrol. This was well tolerated      Assessment & Plan:

## 2015-06-12 NOTE — Progress Notes (Signed)
F/U Knee pain, stated shooting pain. No changes since last visit Complaining of possible hemorrhoids, swelling,  painful  And bleeding

## 2015-06-12 NOTE — Patient Instructions (Signed)
Mrs. Menear,  You have received a shot of steroid in your joint today. Rest and ice knee today. Regular activity tomorrow. Look out for redness, swelling, fever,severe pain in joint and call if you experience these symptoms.  Please go for x-ray at your convenience  F/u in 6 weeks for knee pain, sooner if needed  Dr. Armen Pickup

## 2015-06-21 ENCOUNTER — Ambulatory Visit: Payer: Medicaid Other | Attending: Family Medicine

## 2015-07-10 ENCOUNTER — Ambulatory Visit: Payer: Medicaid Other | Admitting: Family Medicine

## 2015-07-25 ENCOUNTER — Ambulatory Visit: Payer: Medicaid Other | Admitting: Family Medicine

## 2015-07-30 ENCOUNTER — Encounter: Payer: Self-pay | Admitting: Family Medicine

## 2015-07-30 ENCOUNTER — Ambulatory Visit: Payer: Medicaid Other | Attending: Family Medicine | Admitting: Family Medicine

## 2015-07-30 VITALS — BP 120/75 | HR 81 | Temp 98.0°F | Resp 16 | Ht 71.5 in | Wt 332.0 lb

## 2015-07-30 DIAGNOSIS — D649 Anemia, unspecified: Secondary | ICD-10-CM | POA: Insufficient documentation

## 2015-07-30 DIAGNOSIS — Z6841 Body Mass Index (BMI) 40.0 and over, adult: Secondary | ICD-10-CM | POA: Insufficient documentation

## 2015-07-30 DIAGNOSIS — D696 Thrombocytopenia, unspecified: Secondary | ICD-10-CM

## 2015-07-30 DIAGNOSIS — Z87891 Personal history of nicotine dependence: Secondary | ICD-10-CM | POA: Insufficient documentation

## 2015-07-30 DIAGNOSIS — M25561 Pain in right knee: Secondary | ICD-10-CM

## 2015-07-30 DIAGNOSIS — D551 Anemia due to other disorders of glutathione metabolism: Secondary | ICD-10-CM

## 2015-07-30 MED ORDER — METHYLPREDNISOLONE ACETATE 40 MG/ML IJ SUSP
40.0000 mg | Freq: Once | INTRAMUSCULAR | Status: AC
  Administered 2015-07-30: 40 mg via INTRA_ARTICULAR

## 2015-07-30 NOTE — Patient Instructions (Addendum)
Diane Cook,  You have received a shot of steroid in your joint today. Rest and ice knee today. Regular activity tomorrow. Look out for redness, swelling, fever,severe pain in joint and call if you experience these symptoms.  Please go for x-ray of your knees   Lab today to follow up low magnesium, anemia and low platelets in the recent past  Healthcare maintenance: mammogram ordered   F/u in 2 months, sooner if needed for knee pain   Dr. Armen Pickup

## 2015-07-30 NOTE — Progress Notes (Signed)
F/U Rt Knee pain Requesting Steroid INJ

## 2015-07-30 NOTE — Assessment & Plan Note (Signed)
You have received a shot of steroid in your joint today. Rest and ice knee today. Regular activity tomorrow. Look out for redness, swelling, fever,severe pain in joint and call if you experience these symptoms.  Please go for x-ray of your knees

## 2015-07-30 NOTE — Assessment & Plan Note (Signed)
CBC done to recheck Hgb and platelets

## 2015-07-30 NOTE — Progress Notes (Signed)
   Subjective:    Patient ID: Diane Cook, female    DOB: 1956-07-13, 59 y.o.   MRN: [id number] CC: Right knee pain  HPI 59 yo F with morbid obesity presents for f/u appt:  1. R knee pain: persistent. Last steroid injection relieved pain x one month. Patient has not gone for standing knee x-rays as instructed. Pain is moderate. R lateral knee.   2. Low magnesium: has hx of moderate ETOH abuse. Reports she is still drinking but not daily.   3. Low Hgb and platelets: no bleeding or bruising. Has hx of moderate ETOH abuse. Reports she is still drinking but not daily.   History  Substance Use Topics  . Smoking status: Former Smoker -- 0.25 packs/day for 40 years    Types: Cigarettes    Quit date: 05/09/2015  . Smokeless tobacco: Never Used  . Alcohol Use: No     Comment: Patient drinks 6 40oz per day, 1 pint of liquor daily; Last drink 05/09/15   Review of Systems  Constitutional: Negative for fever and chills.  Respiratory: Negative for shortness of breath.   Cardiovascular: Negative for chest pain.  Gastrointestinal: Negative for abdominal pain and blood in stool.  Musculoskeletal: Positive for arthralgias.  Skin: Negative for rash.  Psychiatric/Behavioral: Negative for suicidal ideas and dysphoric mood.       Objective:   Physical Exam BP 120/75 mmHg  Pulse 81  Temp(Src) 98 F (36.7 C) (Oral)  Resp 16  Ht 5' 11.5" (1.816 m)  Wt 332 lb (150.594 kg)  BMI 45.66 kg/m2  SpO2 98%  Wt Readings from Last 3 Encounters:  07/30/15 332 lb (150.594 kg)  06/12/15 334 lb (151.501 kg)  05/29/15 324 lb (146.965 kg)  General appearance: alert, cooperative, no distress and morbidly obese Lungs: normal WOB Extremities: R knee without redness or swelling. Mild TTP R lateral knee. Full ROM of joint.   After obtaining informed consent and cleaning the skin using iodine and alcohol a  steroid injection was performed at R knee using 1% plain Lidocaine and 40 mg of Depo Medrol. This was well  tolerated      Assessment & Plan:

## 2015-07-31 LAB — BASIC METABOLIC PANEL
BUN: 7 mg/dL (ref 7–25)
CHLORIDE: 107 mmol/L (ref 98–110)
CO2: 24 mmol/L (ref 20–31)
CREATININE: 0.93 mg/dL (ref 0.50–0.99)
Calcium: 7.9 mg/dL — ABNORMAL LOW (ref 8.6–10.4)
GLUCOSE: 118 mg/dL — AB (ref 65–99)
Potassium: 3.7 mmol/L (ref 3.5–5.3)
Sodium: 137 mmol/L (ref 135–146)

## 2015-07-31 LAB — CBC
HCT: 29.6 % — ABNORMAL LOW (ref 36.0–46.0)
HEMOGLOBIN: 10.3 g/dL — AB (ref 12.0–15.0)
MCH: 36.7 pg — ABNORMAL HIGH (ref 26.0–34.0)
MCHC: 34.8 g/dL (ref 30.0–36.0)
MCV: 105.3 fL — ABNORMAL HIGH (ref 78.0–100.0)
Platelets: 65 10*3/uL — ABNORMAL LOW (ref 150–400)
RBC: 2.81 MIL/uL — ABNORMAL LOW (ref 3.87–5.11)
RDW: 17.6 % — ABNORMAL HIGH (ref 11.5–15.5)
WBC: 5 10*3/uL (ref 4.0–10.5)

## 2015-07-31 LAB — MAGNESIUM: Magnesium: 1.5 mg/dL (ref 1.5–2.5)

## 2015-07-31 MED ORDER — FOLIC ACID 1 MG PO TABS: 1.0000 mg | ORAL_TABLET | Freq: Every day | ORAL | Status: DC

## 2015-07-31 MED ORDER — THERA VITAL M PO TABS: 1.0000 | ORAL_TABLET | Freq: Every day | ORAL | Status: DC

## 2015-07-31 MED ORDER — THIAMINE HCL 100 MG PO TABS: 100.0000 mg | ORAL_TABLET | Freq: Every day | ORAL | Status: DC

## 2015-07-31 NOTE — Addendum Note (Signed)
Addended by: Dessa Phi on: 07/31/2015 09:39 AM   Modules accepted: Orders

## 2015-07-31 NOTE — Assessment & Plan Note (Signed)
Persistent thrombocytopenia alcohol induced Folic acid, vit B1 and multivitamin ordered patient should take daily

## 2015-08-13 ENCOUNTER — Encounter: Payer: Self-pay | Admitting: Pharmacist

## 2015-09-30 ENCOUNTER — Ambulatory Visit: Payer: Medicaid Other | Admitting: Family Medicine

## 2015-10-15 ENCOUNTER — Ambulatory Visit: Payer: Self-pay | Attending: Family Medicine | Admitting: Family Medicine

## 2015-10-15 ENCOUNTER — Encounter: Payer: Self-pay | Admitting: Family Medicine

## 2015-10-15 VITALS — BP 121/78 | HR 73 | Temp 97.6°F | Resp 16 | Ht 71.5 in | Wt 346.0 lb

## 2015-10-15 DIAGNOSIS — F1010 Alcohol abuse, uncomplicated: Secondary | ICD-10-CM | POA: Insufficient documentation

## 2015-10-15 DIAGNOSIS — Z79899 Other long term (current) drug therapy: Secondary | ICD-10-CM | POA: Insufficient documentation

## 2015-10-15 DIAGNOSIS — K7030 Alcoholic cirrhosis of liver without ascites: Secondary | ICD-10-CM

## 2015-10-15 DIAGNOSIS — Z0000 Encounter for general adult medical examination without abnormal findings: Secondary | ICD-10-CM

## 2015-10-15 DIAGNOSIS — Z23 Encounter for immunization: Secondary | ICD-10-CM | POA: Insufficient documentation

## 2015-10-15 DIAGNOSIS — M25561 Pain in right knee: Secondary | ICD-10-CM

## 2015-10-15 DIAGNOSIS — Z87891 Personal history of nicotine dependence: Secondary | ICD-10-CM | POA: Insufficient documentation

## 2015-10-15 LAB — COMPLETE METABOLIC PANEL WITH GFR
ALBUMIN: 1.9 g/dL — AB (ref 3.6–5.1)
ALK PHOS: 196 U/L — AB (ref 33–130)
ALT: 32 U/L — ABNORMAL HIGH (ref 6–29)
AST: 63 U/L — AB (ref 10–35)
BUN: 8 mg/dL (ref 7–25)
CO2: 24 mmol/L (ref 20–31)
Calcium: 7.5 mg/dL — ABNORMAL LOW (ref 8.6–10.4)
Chloride: 107 mmol/L (ref 98–110)
Creat: 0.92 mg/dL (ref 0.50–0.99)
GFR, Est African American: 78 mL/min (ref >=60)
GFR, Est Non African American: 67 mL/min (ref >=60)
Glucose, Bld: 91 mg/dL (ref 65–99)
POTASSIUM: 4 mmol/L (ref 3.5–5.3)
Sodium: 136 mmol/L (ref 135–146)
Total Bilirubin: 4.7 mg/dL — ABNORMAL HIGH (ref 0.2–1.2)
Total Protein: 7.4 g/dL (ref 6.1–8.1)

## 2015-10-15 NOTE — Progress Notes (Signed)
F/U knee pain  Pt state Knee doing good no pain today  Quit using tobacco since may 2016

## 2015-10-15 NOTE — Assessment & Plan Note (Addendum)
Improved knee pain Get x-rays at your convenience

## 2015-10-15 NOTE — Patient Instructions (Signed)
Diane Cook was seen today for knee pain and follow-up.  Diagnoses and all orders for this visit:  Healthcare maintenance -     Flu Vaccine QUAD 36+ mos IM  Alcoholic cirrhosis of liver without ascites (HCC) -     CBC -     COMPLETE METABOLIC PANEL WITH GFR  Recurrent pain of right knee  improved knee pain  Get x-rays at your convenience  Please call Stoney BangSabrina Holland, [phone],  with the B[facility]CP (breast and cervical cancer control program) at [facility] to set up an appointment to verify eligibility for a breast exam, mammogram, ultrasound. If you qualify [facility].   F.u in 3 months   Dr. Armen PickupFunches

## 2015-10-15 NOTE — Progress Notes (Signed)
Patient ID: Diane Cook, female   DOB: [birth year], 59 y.o.   MRN: [id number]   Subjective:  Patient ID: Diane Cook, female    DOB: 01-12-1956  Age: 59 y.o. MRN: [id number]  CC: Knee Pain and Follow-up   HPI Diane Cook presents for    1. Knee pain: no pain in knees now. Has not gone for x-ray. No swelling or redness in knees.   2. ETOH abuse: reports she is not drinking. No CP, SOB or GI upset. No bleeding or bruising.    Social History  Substance Use Topics  . Smoking status: Former Smoker -- 0.25 packs/day for 40 years    Types: Cigarettes    Quit date: 05/09/2015  . Smokeless tobacco: Never Used  . Alcohol Use: No     Comment: Patient drinks 6 40oz per day, 1 pint of liquor daily; Last drink 05/09/15    Outpatient Prescriptions Prior to Visit  Medication Sig Dispense Refill  . albuterol (PROVENTIL HFA;VENTOLIN HFA) 108 (90 BASE) MCG/ACT inhaler Inhale 2 puffs into the lungs every 6 (six) hours as needed for wheezing or shortness of breath. 1 each 1  . fluticasone (FLONASE) 50 MCG/ACT nasal spray Place 2 sprays into both nostrils daily. 16 g 1  . folic acid (FOLVITE) 1 MG tablet Take 1 tablet (1 mg total) by mouth daily. 30 tablet 5  . furosemide (LASIX) 40 MG tablet Take 1 tablet (40 mg total) by mouth daily. 30 tablet 0  . Multiple Vitamins-Minerals (MULTIVITAMIN) tablet Take 1 tablet by mouth daily. 30 tablet 5  . nicotine (NICODERM CQ) 7 mg/24hr patch Place 1 patch (7 mg total) onto the skin daily. 28 patch 0  . potassium chloride SA (K-DUR,KLOR-CON) 10 MEQ tablet Take 1 tablet (10 mEq total) by mouth daily. 30 tablet 1  . spironolactone (ALDACTONE) 50 MG tablet Take 1 tablet (50 mg total) by mouth daily. 30 tablet 3  . thiamine 100 MG tablet Take 1 tablet (100 mg total) by mouth daily. 30 tablet 5  . traMADol (ULTRAM) 50 MG tablet Take 1 tablet (50 mg total) by mouth at bedtime as needed. 20 tablet 0   No facility-administered medications prior to visit.     ROS Review of Systems  Constitutional: Negative for fever and chills.  Eyes: Negative for visual disturbance.  Respiratory: Negative for shortness of breath.   Cardiovascular: Negative for chest pain.  Gastrointestinal: Negative for abdominal pain and blood in stool.  Musculoskeletal: Negative for back pain and arthralgias.  Skin: Negative for rash.  Allergic/Immunologic: Negative for immunocompromised state.  Hematological: Negative for adenopathy. Does not bruise/bleed easily.  Psychiatric/Behavioral: Negative for suicidal ideas and dysphoric mood.    Objective:  BP 121/78 mmHg  Pulse 73  Temp(Src) 97.6 F (36.4 C) (Oral)  Resp 16  Ht 5' 11.5" (1.816 m)  Wt 346 lb (156.945 kg)  BMI 47.59 kg/m2  SpO2 97%  BP/Weight 10/15/2015 07/30/2015 06/12/2015  Systolic BP 121 120 111  Diastolic BP 78 75 74  Wt. (Lbs) 346 332 334  BMI 47.59 45.66 45.94   Physical Exam  Constitutional: She is oriented to person, place, and time. She appears well-developed and well-nourished. No distress.  HENT:  Head: Normocephalic and atraumatic.  Eyes: Scleral icterus is present.  Cardiovascular: Normal rate, regular rhythm, normal heart sounds and intact distal pulses.   Pulmonary/Chest: Effort normal and breath sounds normal.  Musculoskeletal: She exhibits no edema.  Neurological: She is alert and oriented to person, place,  and time.  Skin: Skin is warm and dry. No rash noted.  Psychiatric: She has a normal mood and affect.     Assessment & Plan:   Problem List Items Addressed This Visit    Alcoholic cirrhosis of liver (HCC) (Chronic)   Relevant Orders   CBC (Completed)   COMPLETE METABOLIC PANEL WITH GFR (Completed)   Healthcare maintenance - Primary    Please call Stoney BangSabrina Holland, [phone],  with the BCCCP (breast and cervical cancer control program) at [facility] to set up an appointment to verify eligibility for a breast exam, mammogram, ultrasound. If you qualify [facility] be  [facility].        Relevant Orders   Flu Vaccine QUAD 36+ mos IM (Completed)   Recurrent pain of right knee    Improved knee pain Get x-rays at your convenience          No orders of the defined types were placed in this encounter.    Follow-up: No Follow-up on file.   Dessa PhiJosalyn Yaasir Menken MD

## 2015-10-16 LAB — CBC
HCT: 31 % — ABNORMAL LOW (ref 36.0–46.0)
Hemoglobin: 10.9 g/dL — ABNORMAL LOW (ref 12.0–15.0)
MCH: 37.1 pg — ABNORMAL HIGH (ref 26.0–34.0)
MCHC: 35.2 g/dL (ref 30.0–36.0)
MCV: 105.4 fL — ABNORMAL HIGH (ref 78.0–100.0)
Platelets: 63 10*3/uL — ABNORMAL LOW (ref 150–400)
RBC: 2.94 MIL/uL — AB (ref 3.87–5.11)
RDW: 18.6 % — ABNORMAL HIGH (ref 11.5–15.5)
WBC: 5.3 10*3/uL (ref 4.0–10.5)

## 2015-10-16 NOTE — Assessment & Plan Note (Signed)
Please call Sabrina Holland, [phone],  with the BCCCP (breast and cervical cancer control program) at the [facility] to set up an appointment to verify eligibility for a breast exam, mammogram, ultrasound. [facility] you qualify [facility].   

## 2015-10-22 ENCOUNTER — Telehealth: Payer: Self-pay | Admitting: *Deleted

## 2015-10-22 NOTE — Telephone Encounter (Signed)
-----   Message from Dessa PhiJosalyn Funches, MD sent at 10/16/2015  8:20 AM EDT ----- Hgb is stable Platelets are still low Elevated AST/ALT Suspect ongoing alcohol use. Low platelets place patient at risk of easy bleeding and bruising, recommend limiting alcohol.

## 2015-10-22 NOTE — Telephone Encounter (Signed)
Date of birth verified by pt Lab results given  Hgb stable, platelets still low Elevated AST/ALT Recommenced to limit alcohol intake

## 2015-10-23 ENCOUNTER — Encounter: Payer: Self-pay | Admitting: Internal Medicine

## 2015-10-28 ENCOUNTER — Telehealth: Payer: Self-pay | Admitting: Lab

## 2015-10-28 NOTE — Telephone Encounter (Signed)
09/17/2015-Pt needed to apply for orange card, 10/02/2015-pt still needed to apply for orange card,10/07/2015-she said she will take paperwork back the next day-10/11/2015-LM for pt to call me-10/14/2015-pt said she would get orange card tomorrow.  Referral is now inactive until pt calls me with orange card

## 2016-02-13 ENCOUNTER — Encounter: Payer: Self-pay | Admitting: Family Medicine

## 2016-02-13 ENCOUNTER — Ambulatory Visit: Payer: Medicaid Other | Attending: Family Medicine | Admitting: Family Medicine

## 2016-02-13 VITALS — BP 128/81 | HR 92 | Temp 98.5°F | Resp 16 | Ht 71.5 in | Wt 333.0 lb

## 2016-02-13 DIAGNOSIS — B182 Chronic viral hepatitis C: Secondary | ICD-10-CM | POA: Insufficient documentation

## 2016-02-13 DIAGNOSIS — M25561 Pain in right knee: Secondary | ICD-10-CM | POA: Insufficient documentation

## 2016-02-13 DIAGNOSIS — Z87891 Personal history of nicotine dependence: Secondary | ICD-10-CM | POA: Diagnosis not present

## 2016-02-13 DIAGNOSIS — Z1231 Encounter for screening mammogram for malignant neoplasm of breast: Secondary | ICD-10-CM

## 2016-02-13 DIAGNOSIS — Z23 Encounter for immunization: Secondary | ICD-10-CM

## 2016-02-13 DIAGNOSIS — Z79899 Other long term (current) drug therapy: Secondary | ICD-10-CM | POA: Diagnosis not present

## 2016-02-13 DIAGNOSIS — L853 Xerosis cutis: Secondary | ICD-10-CM | POA: Diagnosis not present

## 2016-02-13 DIAGNOSIS — G8929 Other chronic pain: Secondary | ICD-10-CM | POA: Insufficient documentation

## 2016-02-13 DIAGNOSIS — K7030 Alcoholic cirrhosis of liver without ascites: Secondary | ICD-10-CM | POA: Insufficient documentation

## 2016-02-13 DIAGNOSIS — Z0000 Encounter for general adult medical examination without abnormal findings: Secondary | ICD-10-CM

## 2016-02-13 DIAGNOSIS — D696 Thrombocytopenia, unspecified: Secondary | ICD-10-CM | POA: Insufficient documentation

## 2016-02-13 LAB — COMPLETE METABOLIC PANEL WITH GFR
ALBUMIN: 1.9 g/dL — AB (ref 3.6–5.1)
ALK PHOS: 193 U/L — AB (ref 33–130)
ALT: 27 U/L (ref 6–29)
AST: 65 U/L — AB (ref 10–35)
BILIRUBIN TOTAL: 4.3 mg/dL — AB (ref 0.2–1.2)
BUN: 9 mg/dL (ref 7–25)
CALCIUM: 7.6 mg/dL — AB (ref 8.6–10.4)
CO2: 23 mmol/L (ref 20–31)
CREATININE: 0.9 mg/dL (ref 0.50–0.99)
Chloride: 109 mmol/L (ref 98–110)
GFR, Est African American: 80 mL/min (ref >=60)
GFR, Est Non African American: 69 mL/min (ref >=60)
Glucose, Bld: 89 mg/dL (ref 65–99)
Potassium: 3.6 mmol/L (ref 3.5–5.3)
SODIUM: 141 mmol/L (ref 135–146)
TOTAL PROTEIN: 7.2 g/dL (ref 6.1–8.1)

## 2016-02-13 LAB — POCT GLYCOSYLATED HEMOGLOBIN (HGB A1C): Hemoglobin A1C: 4.1

## 2016-02-13 MED ORDER — METHYLPREDNISOLONE ACETATE 40 MG/ML IJ SUSP
40.0000 mg | Freq: Once | INTRAMUSCULAR | Status: AC
  Administered 2016-02-13: 40 mg via INTRA_ARTICULAR

## 2016-02-13 MED ORDER — AMMONIUM LACTATE 12 % EX CREA: TOPICAL_CREAM | CUTANEOUS | Status: AC | PRN

## 2016-02-13 MED ORDER — ALBUTEROL SULFATE HFA 108 (90 BASE) MCG/ACT IN AERS: 2.0000 | INHALATION_SPRAY | Freq: Four times a day (QID) | RESPIRATORY_TRACT | Status: DC | PRN

## 2016-02-13 MED ORDER — FUROSEMIDE 40 MG PO TABS: 40.0000 mg | ORAL_TABLET | Freq: Every day | ORAL | Status: DC

## 2016-02-13 NOTE — Progress Notes (Signed)
Subjective:  Patient ID: Diane Cook, female    DOB: June 08, 1956  Age: 60 y.o. MRN: [id number]  CC: Knee Pain   HPI Diane Cook presents for    1. R knee pain: she has chronic pain since injury in June, 2016. She reports improved pain control following her knee injection 4 months ago.  Pain is 6/10. R lateral knee. No falls.  2. Hep C: she denies ETOH. She has not yet had Hep C treatment. She has not yet been to ID clinic.    Social History  Substance Use Topics  . Smoking status: Former Smoker -- 0.25 packs/day for 40 years    Types: Cigarettes    Quit date: 05/09/2015  . Smokeless tobacco: Never Used  . Alcohol Use: No     Comment: Patient drinks 6 40oz per day, 1 pint of liquor daily; Last drink 05/09/15    Outpatient Prescriptions Prior to Visit  Medication Sig Dispense Refill  . albuterol (PROVENTIL HFA;VENTOLIN HFA) 108 (90 BASE) MCG/ACT inhaler Inhale 2 puffs into the lungs every 6 (six) hours as needed for wheezing or shortness of breath. 1 each 1  . fluticasone (FLONASE) 50 MCG/ACT nasal spray Place 2 sprays into both nostrils daily. 16 g 1  . folic acid (FOLVITE) 1 MG tablet Take 1 tablet (1 mg total) by mouth daily. 30 tablet 5  . furosemide (LASIX) 40 MG tablet Take 1 tablet (40 mg total) by mouth daily. 30 tablet 0  . Multiple Vitamins-Minerals (MULTIVITAMIN) tablet Take 1 tablet by mouth daily. 30 tablet 5  . nicotine (NICODERM CQ) 7 mg/24hr patch Place 1 patch (7 mg total) onto the skin daily. 28 patch 0  . potassium chloride SA (K-DUR,KLOR-CON) 10 MEQ tablet Take 1 tablet (10 mEq total) by mouth daily. 30 tablet 1  . spironolactone (ALDACTONE) 50 MG tablet Take 1 tablet (50 mg total) by mouth daily. 30 tablet 3  . thiamine 100 MG tablet Take 1 tablet (100 mg total) by mouth daily. 30 tablet 5  . traMADol (ULTRAM) 50 MG tablet Take 1 tablet (50 mg total) by mouth at bedtime as needed. 20 tablet 0   No facility-administered medications prior to visit.     ROS Review of Systems  Constitutional: Negative for fever and chills.  Eyes: Negative for visual disturbance.  Respiratory: Negative for shortness of breath.   Cardiovascular: Negative for chest pain.  Gastrointestinal: Negative for abdominal pain and blood in stool.  Musculoskeletal: Positive for arthralgias. Negative for back pain.  Skin: Negative for rash.       Dry skin and itchy skin   Allergic/Immunologic: Negative for immunocompromised state.  Hematological: Negative for adenopathy. Does not bruise/bleed easily.  Psychiatric/Behavioral: Negative for suicidal ideas and dysphoric mood.    Objective:  BP 128/81 mmHg  Pulse 92  Temp(Src) 98.5 F (36.9 C) (Oral)  Resp 16  Ht 5' 11.5" (1.816 m)  Wt 333 lb (151.048 kg)  BMI 45.80 kg/m2  SpO2 96%  BP/Weight 02/13/2016 10/15/2015 07/30/2015  Systolic BP 128 121 120  Diastolic BP 81 78 75  Wt. (Lbs) 333 346 332  BMI 45.8 47.59 45.66   Physical Exam  Constitutional: She is oriented to person, place, and time. She appears well-developed and well-nourished. No distress.  HENT:  Head: Normocephalic and atraumatic.  Pulmonary/Chest: Effort normal.  Musculoskeletal: She exhibits edema (trace LE ).       Right knee: She exhibits decreased range of motion. She exhibits no effusion. Tenderness  found. Lateral joint line tenderness noted.  Neurological: She is alert and oriented to person, place, and time.  Skin: Skin is warm and dry. No rash noted.  Dry skin   Psychiatric: She has a normal mood and affect.   Lab Results  Component Value Date   HGBA1C 4.10 02/13/2016   After obtaining informed consent and cleaning the skin using iodine and alcohol a  steroid injection was performed at R knee using 1% plain Lidocaine and 40 mg of Depo Medrol. This was well tolerated Assessment & Plan:   Diane Cook was seen today for knee pain.  Diagnoses and all orders for this visit:  Healthcare maintenance -     POCT glycosylated hemoglobin  (Hb A1C)  Alcoholic cirrhosis of liver without ascites (HCC) -     COMPLETE METABOLIC PANEL WITH GFR  Thrombocytopenia (HCC) -     CBC  Encounter for screening mammogram for breast cancer -     MM DIGITAL SCREENING BILATERAL; Future  Hep C w/o coma, chronic (HCC) -     Ambulatory referral to Infectious Disease  Recurrent pain of right knee -     methylPREDNISolone acetate (DEPO-MEDROL) injection 40 mg; Inject 1 mL (40 mg total) into the articular space once.  Xerosis of skin -     ammonium lactate (LAC-HYDRIN) 12 % cream; Apply topically as needed for dry skin.  Other orders -     furosemide (LASIX) 40 MG tablet; Take 1 tablet (40 mg total) by mouth daily. -     albuterol (PROVENTIL HFA;VENTOLIN HFA) 108 (90 Base) MCG/ACT inhaler; Inhale 2 puffs into the lungs every 6 (six) hours as needed for wheezing or shortness of breath. -     Tdap vaccine greater than or equal to 7yo IM    No orders of the defined types were placed in this encounter.    Follow-up: No Follow-up on file.   Dessa Phi MD

## 2016-02-13 NOTE — Patient Instructions (Addendum)
Diane Cook was seen today for knee pain.  Diagnoses and all orders for this visit:  Healthcare maintenance -     POCT glycosylated hemoglobin (Hb A1C)  Alcoholic cirrhosis of liver without ascites (HCC) -     COMPLETE METABOLIC PANEL WITH GFR  Thrombocytopenia (HCC) -     CBC  Encounter for screening mammogram for breast cancer -     MM DIGITAL SCREENING BILATERAL; Future  Hep C w/o coma, chronic (HCC) -     Ambulatory referral to Infectious Disease  Recurrent pain of right knee -     methylPREDNISolone acetate (DEPO-MEDROL) injection 40 mg; Inject 1 mL (40 mg total) into the articular space once.  Xerosis of skin -     ammonium lactate (LAC-HYDRIN) 12 % cream; Apply topically as needed for dry skin.  Other orders -     furosemide (LASIX) 40 MG tablet; Take 1 tablet (40 mg total) by mouth daily. -     albuterol (PROVENTIL HFA;VENTOLIN HFA) 108 (90 Base) MCG/ACT inhaler; Inhale 2 puffs into the lungs every 6 (six) hours as needed for wheezing or shortness of breath. -     Tdap vaccine greater than or equal to 7yo IM   You have received a shot of steroid in your joint today. Rest and ice knee today. Regular activity tomorrow. Look out for redness, swelling, fever,severe pain in joint and call if you experience these symptoms.  Be sure to apply for orange card and [place] discount. Treatment for hepatitis C is highly recommended to prevent cirrhosis and liver cancer.  F/u in 3 months   Dr. Armen Pickup  Hepatitis C Hepatitis C is a viral infection of the liver. It can lead to scarring of the liver (cirrhosis), liver failure, or liver cancer. Hepatitis C may go undetected for months or years because people with the infection may not have symptoms, or they may have only mild symptoms. CAUSES  Hepatitis C is caused by the hepatitis C virus (HCV). The virus can be passed from one person to another through:  Blood.  Contaminated needles, such as those used for tattooing, body  piercing, acupuncture, or injecting drugs.  Having unprotected sex with an infected person.  Childbirth.  Blood transfusions or organ transplants done in the [place] before 1992. RISK FACTORS Risk factors for hepatitis C include:  Having unprotected sex with an infected person.  Using illegal drugs. SIGNS AND SYMPTOMS  Symptoms of hepatitis C may include:  Fatigue.  Loss of appetite.  Nausea.  Vomiting.  Abdominal pain.  Dark yellow urine.  Yellowish skin and eyes (jaundice).  Itching of the skin.  Clay-colored bowel movements.  Joint pain. Symptoms are not always present.  DIAGNOSIS  Hepatitis C is diagnosed with blood tests. Other types of tests may also be done to check how your liver is functioning. TREATMENT  Your health care provider may perform noninvasive tests or a liver biopsy to help determine the best course of treatment. Treatment for hepatitis C may include one or more medicines. Your health care provider may check you for a recurring infection or other liver conditions every 6-12 months after treatment. HOME CARE INSTRUCTIONS   Rest as needed.  Take all medicines as directed by your health care provider.  Do not take any medicine unless approved by your health care provider. This includes over-the-counter medicine and birth control pills.  Do not drink alcohol.  Do not have sex until approved by your health care provider.  Do  not share toothbrushes, nail clippers, razors, or needles. PREVENTION There is no vaccine for hepatitis C. The only way to prevent the disease is to reduce the risk of exposure to the virus. This may be done by:  Practicing safe sex and using condoms.  Avoiding illegal drugs. SEEK MEDICAL CARE IF:  You have a fever.  You develop abdominal pain.  You develop dark urine.  You have clay-colored bowel movements.  You develop joint pains. SEEK IMMEDIATE MEDICAL CARE IF:  You have increasing fatigue or  weakness.  You lose your appetite.  You feel nauseous or vomit.  You develop jaundice or your jaundice gets worse.  You bruise or bleed easily. MAKE SURE YOU:   Understand these instructions.  Will watch your condition.  Will get help right away if you are not doing well or get worse.   This information is not intended to replace advice given to you by your health care provider. Make sure you discuss any questions you have with your health care provider.   Document Released: 12/11/2000 Document Revised: 01/04/2015 Document Reviewed: 03/28/2014 Elsevier Interactive Patient Education [employer]

## 2016-02-13 NOTE — Progress Notes (Signed)
F/U Rt knee pain. Injury x 1 month  Pain scale # 6 Former smoker  No suicidal thoughts in the past two weeks

## 2016-02-14 LAB — CBC
HCT: 29.9 % — ABNORMAL LOW (ref 36.0–46.0)
Hemoglobin: 10.8 g/dL — ABNORMAL LOW (ref 12.0–15.0)
MCH: 37 pg — ABNORMAL HIGH (ref 26.0–34.0)
MCHC: 36.1 g/dL — AB (ref 30.0–36.0)
MCV: 102.4 fL — ABNORMAL HIGH (ref 78.0–100.0)
Platelets: 65 10*3/uL — ABNORMAL LOW (ref 150–400)
RBC: 2.92 MIL/uL — ABNORMAL LOW (ref 3.87–5.11)
RDW: 17.7 % — AB (ref 11.5–15.5)
WBC: 4.9 10*3/uL (ref 4.0–10.5)

## 2016-02-21 ENCOUNTER — Encounter: Payer: Self-pay | Admitting: Clinical

## 2016-02-21 NOTE — Progress Notes (Signed)
Depression screen [facility] 2/9 02/13/2016 10/15/2015 07/30/2015 06/12/2015 05/29/2015  Decreased Interest 2 0 0 0 0  Down, Depressed, Hopeless 1 0 0 0 0  PHQ - 2 Score 3 0 0 0 0  Altered sleeping 0 - - - -  Tired, decreased energy 1 - - - -  Change in appetite 0 - - - -  Feeling bad or failure about yourself  2 - - - -  Trouble concentrating 3 - - - -  Moving slowly or fidgety/restless 0 - - - -  Suicidal thoughts 0 - - - -  PHQ-9 Score 9 - - - -    GAD 7 : Generalized Anxiety Score 02/13/2016  Nervous, Anxious, on Edge 2  Control/stop worrying 3  Worry too much - different things 3  Trouble relaxing 2  Restless 0  Easily annoyed or irritable 2  Afraid - awful might happen 0  Total GAD 7 Score 12

## 2016-02-26 ENCOUNTER — Telehealth: Payer: Self-pay | Admitting: *Deleted

## 2016-02-26 NOTE — Telephone Encounter (Signed)
Unable to contact pt  "voice mail not set up yet "

## 2016-02-26 NOTE — Telephone Encounter (Signed)
-----   Message from Dessa Phi, MD sent at 02/14/2016  8:43 AM EST ----- Stable anemia and low platelets Low albumin, increase protein in diet  Avoid alcohol

## 2016-03-24 MED FILL — VENTOLIN HFA 90 MCG INHALER: 108 (90 BAS | 30 days supply | Qty: 18 | Fill #0

## 2016-03-24 MED FILL — FUROSEMIDE 40 MG TABLET: 40 | 30 days supply | Qty: 30 | Fill #2

## 2016-03-24 MED FILL — ?SPIRONOLACTONE 50 MG TABLE: 50 | 30 days supply | Qty: 30 | Fill #2

## 2016-03-24 MED FILL — AMMONIUM LACTATE 12% CREAM: 12 | 30 days supply | Qty: 385 | Fill #0

## 2016-03-24 MED FILL — FOLIC ACID 1 MG TABLET: 1 | 30 days supply | Qty: 30 | Fill #0

## 2016-06-06 ENCOUNTER — Inpatient Hospital Stay (HOSPITAL_COMMUNITY)
Admission: EM | Admit: 2016-06-06 | Discharge: 2016-06-12 | DRG: 378 | Disposition: A | Discharge status: Skilled Nursing Facility | Payer: Medicaid Other | Attending: Internal Medicine | Admitting: Internal Medicine

## 2016-06-06 ENCOUNTER — Encounter (HOSPITAL_COMMUNITY): Payer: Self-pay

## 2016-06-06 DIAGNOSIS — F1020 Alcohol dependence, uncomplicated: Secondary | ICD-10-CM | POA: Diagnosis present

## 2016-06-06 DIAGNOSIS — B182 Chronic viral hepatitis C: Secondary | ICD-10-CM

## 2016-06-06 DIAGNOSIS — I110 Hypertensive heart disease with heart failure: Secondary | ICD-10-CM | POA: Diagnosis present

## 2016-06-06 DIAGNOSIS — E872 Acidosis: Secondary | ICD-10-CM

## 2016-06-06 DIAGNOSIS — E8809 Other disorders of plasma-protein metabolism, not elsewhere classified: Secondary | ICD-10-CM | POA: Diagnosis present

## 2016-06-06 DIAGNOSIS — E876 Hypokalemia: Secondary | ICD-10-CM | POA: Diagnosis present

## 2016-06-06 DIAGNOSIS — K7290 Hepatic failure, unspecified without coma: Secondary | ICD-10-CM | POA: Diagnosis present

## 2016-06-06 DIAGNOSIS — Z87891 Personal history of nicotine dependence: Secondary | ICD-10-CM

## 2016-06-06 DIAGNOSIS — R7989 Other specified abnormal findings of blood chemistry: Secondary | ICD-10-CM | POA: Insufficient documentation

## 2016-06-06 DIAGNOSIS — J45909 Unspecified asthma, uncomplicated: Secondary | ICD-10-CM | POA: Diagnosis present

## 2016-06-06 DIAGNOSIS — Z8673 Personal history of transient ischemic attack (TIA), and cerebral infarction without residual deficits: Secondary | ICD-10-CM

## 2016-06-06 DIAGNOSIS — D6959 Other secondary thrombocytopenia: Secondary | ICD-10-CM | POA: Diagnosis present

## 2016-06-06 DIAGNOSIS — K922 Gastrointestinal hemorrhage, unspecified: Secondary | ICD-10-CM | POA: Diagnosis not present

## 2016-06-06 DIAGNOSIS — K766 Portal hypertension: Secondary | ICD-10-CM | POA: Diagnosis present

## 2016-06-06 DIAGNOSIS — D649 Anemia, unspecified: Secondary | ICD-10-CM | POA: Diagnosis present

## 2016-06-06 DIAGNOSIS — M1990 Unspecified osteoarthritis, unspecified site: Secondary | ICD-10-CM | POA: Diagnosis present

## 2016-06-06 DIAGNOSIS — F319 Bipolar disorder, unspecified: Secondary | ICD-10-CM | POA: Diagnosis present

## 2016-06-06 DIAGNOSIS — K2981 Duodenitis with bleeding: Principal | ICD-10-CM | POA: Diagnosis present

## 2016-06-06 DIAGNOSIS — R791 Abnormal coagulation profile: Secondary | ICD-10-CM

## 2016-06-06 DIAGNOSIS — I5032 Chronic diastolic (congestive) heart failure: Secondary | ICD-10-CM | POA: Diagnosis present

## 2016-06-06 DIAGNOSIS — Z79899 Other long term (current) drug therapy: Secondary | ICD-10-CM | POA: Diagnosis not present

## 2016-06-06 DIAGNOSIS — R17 Unspecified jaundice: Secondary | ICD-10-CM

## 2016-06-06 DIAGNOSIS — N179 Acute kidney failure, unspecified: Secondary | ICD-10-CM | POA: Diagnosis present

## 2016-06-06 DIAGNOSIS — K7030 Alcoholic cirrhosis of liver without ascites: Secondary | ICD-10-CM | POA: Diagnosis present

## 2016-06-06 DIAGNOSIS — Z6841 Body Mass Index (BMI) 40.0 and over, adult: Secondary | ICD-10-CM

## 2016-06-06 DIAGNOSIS — D62 Acute posthemorrhagic anemia: Secondary | ICD-10-CM | POA: Diagnosis present

## 2016-06-06 DIAGNOSIS — R51 Headache: Secondary | ICD-10-CM | POA: Diagnosis present

## 2016-06-06 DIAGNOSIS — R740 Nonspecific elevation of levels of transaminase and lactic acid dehydrogenase [LDH]: Secondary | ICD-10-CM | POA: Diagnosis present

## 2016-06-06 DIAGNOSIS — D500 Iron deficiency anemia secondary to blood loss (chronic): Secondary | ICD-10-CM

## 2016-06-06 DIAGNOSIS — D684 Acquired coagulation factor deficiency: Secondary | ICD-10-CM | POA: Diagnosis present

## 2016-06-06 DIAGNOSIS — D689 Coagulation defect, unspecified: Secondary | ICD-10-CM | POA: Diagnosis present

## 2016-06-06 DIAGNOSIS — F1010 Alcohol abuse, uncomplicated: Secondary | ICD-10-CM | POA: Diagnosis present

## 2016-06-06 DIAGNOSIS — K3189 Other diseases of stomach and duodenum: Secondary | ICD-10-CM | POA: Diagnosis present

## 2016-06-06 DIAGNOSIS — D731 Hypersplenism: Secondary | ICD-10-CM | POA: Diagnosis present

## 2016-06-06 DIAGNOSIS — D696 Thrombocytopenia, unspecified: Secondary | ICD-10-CM

## 2016-06-06 LAB — CBC WITH DIFFERENTIAL/PLATELET
BASOS ABS: 0 10*3/uL (ref 0.0–0.1)
BASOS PCT: 1 %
EOS ABS: 0.3 10*3/uL (ref 0.0–0.7)
Eosinophils Relative: 7 %
HCT: 21 % — ABNORMAL LOW (ref 36.0–46.0)
HEMOGLOBIN: 7.3 g/dL — AB (ref 12.0–15.0)
Lymphocytes Relative: 40 %
Lymphs Abs: 2 10*3/uL (ref 0.7–4.0)
MCH: 37.6 pg — ABNORMAL HIGH (ref 26.0–34.0)
MCHC: 34.8 g/dL (ref 30.0–36.0)
MCV: 108.2 fL — ABNORMAL HIGH (ref 78.0–100.0)
Monocytes Absolute: 0.5 10*3/uL (ref 0.1–1.0)
Monocytes Relative: 11 %
NEUTROS PCT: 43 %
Neutro Abs: 2.1 10*3/uL (ref 1.7–7.7)
Platelets: 54 10*3/uL — ABNORMAL LOW (ref 150–400)
RBC: 1.94 MIL/uL — AB (ref 3.87–5.11)
RDW: 19.6 % — ABNORMAL HIGH (ref 11.5–15.5)
WBC: 5 10*3/uL (ref 4.0–10.5)

## 2016-06-06 LAB — COMPREHENSIVE METABOLIC PANEL
ALK PHOS: 119 U/L (ref 38–126)
ALT: 28 U/L (ref 14–54)
ALT: 29 U/L (ref 14–54)
ANION GAP: 5 (ref 5–15)
AST: 62 U/L — ABNORMAL HIGH (ref 15–41)
AST: 64 U/L — AB (ref 15–41)
Albumin: 1.3 g/dL — ABNORMAL LOW (ref 3.5–5.0)
Albumin: 1.6 g/dL — ABNORMAL LOW (ref 3.5–5.0)
Alkaline Phosphatase: 104 U/L (ref 38–126)
Anion gap: 4 — ABNORMAL LOW (ref 5–15)
BILIRUBIN TOTAL: 5 mg/dL — AB (ref 0.3–1.2)
BUN: 19 mg/dL (ref 6–20)
BUN: 21 mg/dL — ABNORMAL HIGH (ref 6–20)
CALCIUM: 7.6 mg/dL — AB (ref 8.9–10.3)
CO2: 22 mmol/L (ref 22–32)
CO2: 22 mmol/L (ref 22–32)
CREATININE: 1.31 mg/dL — AB (ref 0.44–1.00)
Calcium: 7.7 mg/dL — ABNORMAL LOW (ref 8.9–10.3)
Chloride: 111 mmol/L (ref 101–111)
Chloride: 110 mmol/L (ref 101–111)
Creatinine, Ser: 1.59 mg/dL — ABNORMAL HIGH (ref 0.44–1.00)
GFR calc Af Amer: 39 mL/min — ABNORMAL LOW (ref >60)
GFR, EST AFRICAN AMERICAN: 49 mL/min — AB (ref >60)
GFR, EST NON AFRICAN AMERICAN: 34 mL/min — AB (ref >60)
GFR, EST NON AFRICAN AMERICAN: 43 mL/min — AB (ref >60)
GLUCOSE: 87 mg/dL (ref 65–99)
Glucose, Bld: 94 mg/dL (ref 65–99)
POTASSIUM: 4 mmol/L (ref 3.5–5.1)
Potassium: 3.6 mmol/L (ref 3.5–5.1)
Sodium: 137 mmol/L (ref 135–145)
Sodium: 137 mmol/L (ref 135–145)
TOTAL PROTEIN: 6.8 g/dL (ref 6.5–8.1)
Total Bilirubin: 4.7 mg/dL — ABNORMAL HIGH (ref 0.3–1.2)
Total Protein: 6.8 g/dL (ref 6.5–8.1)

## 2016-06-06 LAB — CBC
HCT: 24 % — ABNORMAL LOW (ref 36.0–46.0)
HEMATOCRIT: 21.7 % — AB (ref 36.0–46.0)
HEMOGLOBIN: 8.5 g/dL — AB (ref 12.0–15.0)
Hemoglobin: 7.5 g/dL — ABNORMAL LOW (ref 12.0–15.0)
MCH: 36.8 pg — ABNORMAL HIGH (ref 26.0–34.0)
MCH: 36.5 pg — ABNORMAL HIGH (ref 26.0–34.0)
MCHC: 34.6 g/dL (ref 30.0–36.0)
MCHC: 35.4 g/dL (ref 30.0–36.0)
MCV: 106.4 fL — AB (ref 78.0–100.0)
MCV: 103 fL — ABNORMAL HIGH (ref 78.0–100.0)
PLATELETS: 48 10*3/uL — AB (ref 150–400)
Platelets: 44 10*3/uL — ABNORMAL LOW (ref 150–400)
RBC: 2.04 MIL/uL — AB (ref 3.87–5.11)
RBC: 2.33 MIL/uL — AB (ref 3.87–5.11)
RDW: 20.2 % — AB (ref 11.5–15.5)
RDW: 22.7 % — ABNORMAL HIGH (ref 11.5–15.5)
WBC: 4.6 10*3/uL (ref 4.0–10.5)
WBC: 5.3 10*3/uL (ref 4.0–10.5)

## 2016-06-06 LAB — URINALYSIS, ROUTINE W REFLEX MICROSCOPIC
Glucose, UA: NEGATIVE mg/dL
Ketones, ur: NEGATIVE mg/dL
Nitrite: POSITIVE — AB
Protein, ur: NEGATIVE mg/dL
SPECIFIC GRAVITY, URINE: 1.023 (ref 1.005–1.030)
pH: 5.5 (ref 5.0–8.0)

## 2016-06-06 LAB — GLUCOSE, CAPILLARY: Glucose-Capillary: 95 mg/dL (ref 65–99)

## 2016-06-06 LAB — AMMONIA: Ammonia: 86 umol/L — ABNORMAL HIGH (ref 9–35)

## 2016-06-06 LAB — TROPONIN I: Troponin I: <0.03 ng/mL (ref <0.031)

## 2016-06-06 LAB — URINE MICROSCOPIC-ADD ON
RBC / HPF: NONE SEEN RBC/hpf (ref 0–5)
Squamous Epithelial / LPF: NONE SEEN

## 2016-06-06 LAB — MRSA PCR SCREENING: MRSA by PCR: NEGATIVE

## 2016-06-06 LAB — ABO/RH: ABO/RH(D): O POS

## 2016-06-06 LAB — PROTIME-INR
INR: 2.27 — ABNORMAL HIGH (ref 0.00–1.49)
PROTHROMBIN TIME: 24.8 s — AB (ref 11.6–15.2)

## 2016-06-06 LAB — I-STAT CG4 LACTIC ACID, ED: LACTIC ACID, VENOUS: 2.16 mmol/L — AB (ref 0.5–2.0)

## 2016-06-06 LAB — ETHANOL: Alcohol, Ethyl (B): 17 mg/dL — ABNORMAL HIGH (ref <5)

## 2016-06-06 LAB — POC OCCULT BLOOD, ED: FECAL OCCULT BLD: POSITIVE — AB

## 2016-06-06 MED ORDER — OXYCODONE HCL 5 MG PO TABS
5.0000 mg | ORAL_TABLET | ORAL | Status: DC | PRN
  Administered 2016-06-07: 5 mg via ORAL
  Filled 2016-06-06: qty 1

## 2016-06-06 MED ORDER — SPIRONOLACTONE 50 MG PO TABS
50.0000 mg | ORAL_TABLET | Freq: Every day | ORAL | Status: DC
  Filled 2016-06-06: qty 1

## 2016-06-06 MED ORDER — SODIUM CHLORIDE 0.9 % IV BOLUS (SEPSIS)
1000.0000 mL | Freq: Once | INTRAVENOUS | Status: AC
  Administered 2016-06-06: 1000 mL via INTRAVENOUS

## 2016-06-06 MED ORDER — SODIUM CHLORIDE 0.9% FLUSH
3.0000 mL | Freq: Two times a day (BID) | INTRAVENOUS | Status: DC
  Administered 2016-06-06 – 2016-06-12 (×5): 3 mL via INTRAVENOUS

## 2016-06-06 MED ORDER — ONDANSETRON HCL 4 MG/2ML IJ SOLN: 4.0000 mg | Freq: Four times a day (QID) | INTRAMUSCULAR | Status: DC | PRN

## 2016-06-06 MED ORDER — DEXTROSE 5 % IV SOLN
1.0000 g | Freq: Every day | INTRAVENOUS | Status: DC
  Administered 2016-06-06 – 2016-06-10 (×5): 1 g via INTRAVENOUS
  Filled 2016-06-06 (×5): qty 10

## 2016-06-06 MED ORDER — SODIUM CHLORIDE 0.9% FLUSH
3.0000 mL | Freq: Two times a day (BID) | INTRAVENOUS | Status: DC
  Administered 2016-06-06 – 2016-06-12 (×8): 3 mL via INTRAVENOUS

## 2016-06-06 MED ORDER — THIAMINE HCL 100 MG/ML IJ SOLN
100.0000 mg | Freq: Every day | INTRAMUSCULAR | Status: DC
  Administered 2016-06-06 – 2016-06-12 (×7): 100 mg via INTRAVENOUS
  Filled 2016-06-06 (×7): qty 2

## 2016-06-06 MED ORDER — FUROSEMIDE 10 MG/ML IJ SOLN: 20.0000 mg | Freq: Two times a day (BID) | INTRAMUSCULAR | Status: DC

## 2016-06-06 MED ORDER — SODIUM CHLORIDE 0.9 % IV SOLN
8.0000 mg/h | INTRAVENOUS | Status: AC
  Administered 2016-06-06 – 2016-06-08 (×7): 8 mg/h via INTRAVENOUS
  Filled 2016-06-06 (×13): qty 80

## 2016-06-06 MED ORDER — PANTOPRAZOLE SODIUM 40 MG IV SOLR
40.0000 mg | Freq: Two times a day (BID) | INTRAVENOUS | Status: DC
  Administered 2016-06-09 – 2016-06-12 (×6): 40 mg via INTRAVENOUS
  Filled 2016-06-06 (×6): qty 40

## 2016-06-06 MED ORDER — LACTULOSE 10 GM/15ML PO SOLN
20.0000 g | Freq: Two times a day (BID) | ORAL | Status: DC
  Administered 2016-06-06 – 2016-06-12 (×13): 20 g via ORAL
  Filled 2016-06-06 (×15): qty 30

## 2016-06-06 MED ORDER — ACETAMINOPHEN 325 MG PO TABS
650.0000 mg | ORAL_TABLET | Freq: Once | ORAL | Status: AC
  Administered 2016-06-06: 650 mg via ORAL
  Filled 2016-06-06: qty 2

## 2016-06-06 MED ORDER — SODIUM CHLORIDE 0.9% FLUSH: 3.0000 mL | INTRAVENOUS | Status: DC | PRN

## 2016-06-06 MED ORDER — ALBUMIN HUMAN 25 % IV SOLN
25.0000 g | Freq: Four times a day (QID) | INTRAVENOUS | Status: DC
  Administered 2016-06-06 – 2016-06-10 (×18): 25 g via INTRAVENOUS
  Filled 2016-06-06: qty 50
  Filled 2016-06-06 (×8): qty 100
  Filled 2016-06-06: qty 50
  Filled 2016-06-06 (×8): qty 100
  Filled 2016-06-06: qty 50
  Filled 2016-06-06 (×8): qty 100

## 2016-06-06 MED ORDER — FLUTICASONE PROPIONATE 50 MCG/ACT NA SUSP
2.0000 | Freq: Every day | NASAL | Status: DC
  Administered 2016-06-06 – 2016-06-12 (×7): 2 via NASAL
  Filled 2016-06-06: qty 16

## 2016-06-06 MED ORDER — OCTREOTIDE LOAD VIA INFUSION
50.0000 ug | Freq: Once | INTRAVENOUS | Status: AC
  Administered 2016-06-06: 50 ug via INTRAVENOUS
  Filled 2016-06-06: qty 25

## 2016-06-06 MED ORDER — DIPHENHYDRAMINE HCL 50 MG/ML IJ SOLN
25.0000 mg | Freq: Once | INTRAMUSCULAR | Status: AC
  Administered 2016-06-06: 25 mg via INTRAVENOUS
  Filled 2016-06-06: qty 1

## 2016-06-06 MED ORDER — SODIUM CHLORIDE 0.9 % IV SOLN
INTRAVENOUS | Status: DC
  Administered 2016-06-06 – 2016-06-07 (×2): via INTRAVENOUS

## 2016-06-06 MED ORDER — LORAZEPAM 2 MG/ML IJ SOLN: 2.0000 mg | INTRAMUSCULAR | Status: DC | PRN

## 2016-06-06 MED ORDER — ALBUTEROL SULFATE (2.5 MG/3ML) 0.083% IN NEBU: 2.5000 mg | INHALATION_SOLUTION | Freq: Four times a day (QID) | RESPIRATORY_TRACT | Status: DC | PRN

## 2016-06-06 MED ORDER — SODIUM CHLORIDE 0.9 % IV SOLN
Freq: Once | INTRAVENOUS | Status: AC
  Administered 2016-06-06: 12:00:00 via INTRAVENOUS

## 2016-06-06 MED ORDER — ALBUTEROL SULFATE HFA 108 (90 BASE) MCG/ACT IN AERS: 2.0000 | INHALATION_SPRAY | Freq: Four times a day (QID) | RESPIRATORY_TRACT | Status: DC | PRN

## 2016-06-06 MED ORDER — AMMONIUM LACTATE 12 % EX CREA
TOPICAL_CREAM | CUTANEOUS | Status: DC | PRN
  Filled 2016-06-06: qty 140

## 2016-06-06 MED ORDER — ONDANSETRON HCL 4 MG PO TABS: 4.0000 mg | ORAL_TABLET | Freq: Four times a day (QID) | ORAL | Status: DC | PRN

## 2016-06-06 MED ORDER — SODIUM CHLORIDE 0.9 % IV SOLN
80.0000 mg | Freq: Once | INTRAVENOUS | Status: AC
  Administered 2016-06-06: 80 mg via INTRAVENOUS
  Filled 2016-06-06: qty 80

## 2016-06-06 MED ORDER — SODIUM CHLORIDE 0.9 % IV SOLN
Freq: Once | INTRAVENOUS | Status: AC
  Administered 2016-06-06: 06:00:00 via INTRAVENOUS

## 2016-06-06 MED ORDER — SODIUM CHLORIDE 0.9 % IV SOLN
50.0000 ug/h | INTRAVENOUS | Status: DC
  Administered 2016-06-06 – 2016-06-12 (×16): 50 ug/h via INTRAVENOUS
  Filled 2016-06-06 (×28): qty 1

## 2016-06-06 MED ORDER — SODIUM CHLORIDE 0.9 % IV SOLN
250.0000 mL | INTRAVENOUS | Status: DC | PRN
  Administered 2016-06-10: 250 mL via INTRAVENOUS

## 2016-06-06 MED ORDER — FUROSEMIDE 10 MG/ML IJ SOLN
20.0000 mg | Freq: Once | INTRAMUSCULAR | Status: AC
  Administered 2016-06-06: 20 mg via INTRAVENOUS
  Filled 2016-06-06: qty 2

## 2016-06-06 MED ORDER — FOLIC ACID 5 MG/ML IJ SOLN
1.0000 mg | Freq: Every day | INTRAMUSCULAR | Status: DC
  Administered 2016-06-06 – 2016-06-12 (×6): 1 mg via INTRAVENOUS
  Filled 2016-06-06 (×10): qty 0.2

## 2016-06-06 MED ORDER — VITAMIN K1 10 MG/ML IJ SOLN
10.0000 mg | Freq: Once | INTRAMUSCULAR | Status: AC
  Administered 2016-06-06: 10 mg via INTRAVENOUS
  Filled 2016-06-06: qty 1

## 2016-06-06 NOTE — Consult Note (Signed)
[facility] Consultation Note  Referring Provider: Jeoffrey Massed, MD [facility]) Primary Care Physician:  Lora Paula, MD  Reason for Consultation:  Anemia, melena  HPI: Diane Cook is a 60 y.o. female with history of obesity, cirrhosis, Hepatitis C, alcoholism (8 drinks per day), presenting with weakness and pre-syncope.  Found to be anemic, Hgb ~ 7.  She has had couple dark stools per day for the past one week.  No abdominal pain.  No hematemesis or hematochezia.  Takes lots of NSAIDs recently for headaches.  Stools hemoccult-positive.  CT results as below.  Lab results as below.  No prior endoscopy.   Past Medical History  Diagnosis Date  . Hypertension Dx 2004  . Arthritis Dx 2014  . Stroke [facility]) Dx 1997  . Asthma Dx 2001  . H/O non anemic vitamin B12 deficiency   . Hepatitis C Dx 2011  . Alcoholism (HCC)   . Family history of adverse reaction to anesthesia     sister & daughter have a hard time waking up   . Bipolar disorder (HCC) [id number]    Past Surgical History  Procedure Laterality Date  . Abdominal hysterectomy    . Cholecystectomy      Prior to Admission medications   Medication Sig Start Date End Date Taking? Authorizing Provider  albuterol (PROVENTIL HFA;VENTOLIN HFA) 108 (90 Base) MCG/ACT inhaler Inhale 2 puffs into the lungs every 6 (six) hours as needed for wheezing or shortness of breath. 02/13/16  Yes Josalyn Funches, MD  ammonium lactate (LAC-HYDRIN) 12 % cream Apply topically as needed for dry skin. 02/13/16  Yes Josalyn Funches, MD  fluticasone (FLONASE) 50 MCG/ACT nasal spray Place 2 sprays into both nostrils daily. 11/18/13  Yes Robyn M Hess, PA-C  folic acid (FOLVITE) 1 MG tablet Take 1 tablet (1 mg total) by mouth daily. 07/31/15  Yes Josalyn Funches, MD  furosemide (LASIX) 40 MG tablet Take 1 tablet (40 mg total) by mouth daily. 02/13/16  Yes Josalyn Funches, MD  naproxen (NAPROSYN) 500 MG tablet Take 500 mg by mouth 2 (two) times daily as needed  (for pain/headache.).   Yes Historical Provider, MD  spironolactone (ALDACTONE) 50 MG tablet Take 1 tablet (50 mg total) by mouth daily. 04/26/15  Yes Albertine Grates, MD  Multiple Vitamins-Minerals (MULTIVITAMIN) tablet Take 1 tablet by mouth daily. Patient not taking: Reported on 06/06/2016 07/31/15   Dessa Phi, MD  potassium chloride SA (K-DUR,KLOR-CON) 10 MEQ tablet Take 1 tablet (10 mEq total) by mouth daily. Patient not taking: Reported on 06/06/2016 05/02/15   Jaclyn Shaggy, MD  thiamine 100 MG tablet Take 1 tablet (100 mg total) by mouth daily. Patient not taking: Reported on 06/06/2016 07/31/15   Dessa Phi, MD    Current Facility-Administered Medications  Medication Dose Route Frequency Provider Last Rate Last Dose  . 0.9 %  sodium chloride infusion  250 mL Intravenous PRN Lavone Neri Opyd, MD      . 0.9 %  sodium chloride infusion   Intravenous Continuous Maretta Bees, MD 75 mL/hr at 06/06/16 0830    . albumin human 25 % solution 25 g  25 g Intravenous Q6H Briscoe Deutscher, MD   25 g at 06/06/16 0926  . albuterol (PROVENTIL) (2.5 MG/3ML) 0.083% nebulizer solution 2.5 mg  2.5 mg Nebulization Q6H PRN Lavone Neri Opyd, MD      . ammonium lactate (AMLACTIN) 12 % cream   Topical PRN Briscoe Deutscher, MD      .  cefTRIAXone (ROCEPHIN) 1 g in dextrose 5 % 50 mL IVPB  1 g Intravenous Q0600 Briscoe Deutscher, MD   1 g at 06/06/16 0507  . fluticasone (FLONASE) 50 MCG/ACT nasal spray 2 spray  2 spray Each Nare Daily Briscoe Deutscher, MD   2 spray at 06/06/16 0930  . folic acid injection 1 mg  1 mg Intravenous Daily Lavone Neri Opyd, MD      . lactulose (CHRONULAC) 10 GM/15ML solution 20 g  20 g Oral BID Briscoe Deutscher, MD   20 g at 06/06/16 0552  . LORazepam (ATIVAN) injection 2-3 mg  2-3 mg Intravenous Q1H PRN Lavone Neri Opyd, MD      . octreotide (SANDOSTATIN) 500 mcg in sodium chloride 0.9 % 250 mL (2 mcg/mL) infusion  50 mcg/hr Intravenous Continuous Lavone Neri Opyd, MD 25 mL/hr at 06/06/16 0900 50 mcg/hr at  06/06/16 0900  . ondansetron (ZOFRAN) tablet 4 mg  4 mg Oral Q6H PRN Briscoe Deutscher, MD       Or  . ondansetron (ZOFRAN) injection 4 mg  4 mg Intravenous Q6H PRN Briscoe Deutscher, MD      . oxyCODONE (Oxy IR/ROXICODONE) immediate release tablet 5 mg  5 mg Oral Q4H PRN Briscoe Deutscher, MD      . pantoprazole (PROTONIX) 80 mg in sodium chloride 0.9 % 250 mL (0.32 mg/mL) infusion  8 mg/hr Intravenous Continuous Lavone Neri Opyd, MD 25 mL/hr at 06/06/16 0900 8 mg/hr at 06/06/16 0900  . [START ON 06/09/2016] pantoprazole (PROTONIX) injection 40 mg  40 mg Intravenous Q12H Timothy S Opyd, MD      . sodium chloride flush (NS) 0.9 % injection 3 mL  3 mL Intravenous Q12H Timothy S Opyd, MD      . sodium chloride flush (NS) 0.9 % injection 3 mL  3 mL Intravenous Q12H Timothy S Opyd, MD      . sodium chloride flush (NS) 0.9 % injection 3 mL  3 mL Intravenous PRN Lavone Neri Opyd, MD      . thiamine (B-1) injection 100 mg  100 mg Intravenous Daily Briscoe Deutscher, MD   100 mg at 06/06/16 0926    Allergies as of 06/06/2016  . (No Known Allergies)    Family History  Problem Relation Age of Onset  . Diabetes Mother   . Diabetes Daughter     Social History   Social History  . Marital Status: Single    Spouse Name: N/A  . Number of Children: N/A  . Years of Education: N/A   Occupational History  . Not on file.   Social History Main Topics  . Smoking status: Former Smoker -- 0.25 packs/day for 40 years    Types: Cigarettes    Quit date: 05/09/2015  . Smokeless tobacco: Never Used  . Alcohol Use: No     Comment: Patient drinks 6 40oz per day, 1 pint of liquor daily; Last drink 05/09/15  . Drug Use: No  . Sexual Activity: Not on file   Other Topics Concern  . Not on file   Social History Narrative    Review of Systems: Positive = bold Gen: Denies any fever, chills, rigors, night sweats, anorexia, fatigue, weakness, malaise, involuntary weight loss, and sleep disorder CV: Denies chest pain,  angina, palpitations, syncope, orthopnea, PND, peripheral edema, and claudication. Resp: Denies dyspnea, cough, sputum, wheezing, coughing up blood. GI: Described in detail in HPI.    GU : Denies  urinary burning, blood in urine, urinary frequency, urinary hesitancy, nocturnal urination, and urinary incontinence. MS: Denies joint pain or swelling.  Denies muscle weakness, cramps, atrophy.  Derm: Denies rash, itching, oral ulcerations, hives, unhealing ulcers.  Psych: Denies depression, anxiety, memory loss, suicidal ideation, hallucinations,  and confusion. Heme: Denies bruising, bleeding, and enlarged lymph nodes. Neuro:  Denies any headaches, dizziness, paresthesias. Endo:  Denies any problems with DM, thyroid, adrenal function.  Physical Exam: Vital signs in last 24 hours: Temp:  [97.3 F (36.3 C)-98.3 F (36.8 C)] 98.2 F (36.8 C) (06/10 0800) Pulse Rate:  [73-84] 84 (06/10 0312) Resp:  [12-23] 18 (06/10 0900) BP: (91-119)/(40-66) 95/45 mmHg (06/10 0900) SpO2:  [97 %-100 %] 100 % (06/10 0900) Weight:  [172.8 kg (380 lb 15.3 oz)] 172.8 kg (380 lb 15.3 oz) (06/10 0500)   General:   Alert, somnolent but arouseable, morbidly obese, NAD Head:  Normocephalic and atraumatic. Eyes:  Sclera icteric bilaterally   Conjunctiva pale Ears:  Normal auditory acuity. Nose:  No deformity, discharge,  or lesions. Mouth:  No deformity or lesions.  Oropharynx pale and dry Neck:  Supple; no masses or thyromegaly. Lungs:  Clear throughout to auscultation.   No wheezes, crackles, or rhonchi. No acute distress. Heart:  Regular rate and rhythm; no murmurs, clicks, rubs,  or gallops. Abdomen:  Soft, nontender and nondistended. Obese, protuberant.  No masses, hepatosplenomegaly or hernias noted. Normal bowel sounds, without guarding, and without rebound.     Msk:  Symmetrical without gross deformities. Normal posture. Pulses:  Normal pulses noted. Extremities:  Without clubbing or edema. Neurologic:   Alert and  oriented x4; diffusely weak, otherwise grossly normal neurologically. Skin:  Intact without significant lesions or rashes. Psych:  Somnolent but arouseable; cooperative. Normal mood and affect.   Lab Results:  Recent Labs  06/06/16 0050  WBC 5.0  HGB 7.3*  HCT 21.0*  PLT 54*   BMET  Recent Labs  06/06/16 0050  NA 137  K 3.6  CL 111  CO2 22  GLUCOSE 94  BUN 19  CREATININE 1.31*  CALCIUM 7.6*   LFT  Recent Labs  06/06/16 0050  PROT 6.8  ALBUMIN 1.3*  AST 62*  ALT 28  ALKPHOS 119  BILITOT 4.7*   PT/INR  Recent Labs  06/06/16 0050  LABPROT 24.8*  INR 2.27*    Studies/Results: No results found.  Impression:  1.  Melena, for one week.  No hematemesis or hematochezia.  BUN normal.  Suspect slow bleed from portal gastropathy.  History not compatible with variceal bleeding.  Ulcer possible given NSAID use but, again, BUN has been normal.  No evidence of rampant destabilizing GI tract bleeding. 2.  Cirrhosis.  Multifactorial (alcohol and Hepatitis C).  MELD 24.  Not liver transplant candidate due to ongoing alcohol abuse. 3.  Coagulopathy, not on anticoagulation, likely from hepatic synthetic dysfunction. 4.  Anemia.  Likely component of both acute blood loss as well as bone marrow suppression from alcohol use. 5.  Thrombocytopenia, likely from portal hypertension from cirrhosis. 6.  Obesity. 7.  Alcohol abuse, ongoing. 8.  Calcifications in hepatic hilum, post-cholecystectomy.  Prominence of biliary system.  Patient has no abdominal pain, and I suspect her elevated LFTs are from her cirrhosis (HCV and ongoing alcohol use), doubt any significant biliary tract stone component.  Plan:  1.  Correct coagulopathy (Vitamin K has been administered).  If platelets drop below 50K, will need platelet infusion immediately prior to any planned endoscopy (  see below). 2.  PPI. 3.  Full liquid diet is ok. 4.  Patient is a relatively high sedation risk given her  obesity and ongoing alcohol use.  If she has destabilizing bleeding over the weekend, we will do endoscopy emergently in the operating room.  If not, we will plan to do endoscopy Monday with propofol in our hospital endoscopy unit (if anesthesia time is available). 5.  Might eventually need MRCP to clarify intrahepatic biliary prominence and hepatic hilum calcifications post-cholecystectomy. 6.  [facility] will follow.   LOS: 0 days   Jaycen Vercher M  06/06/2016, 9:50 AM  Pager [phone] If no answer or after 5 PM call [phone]

## 2016-06-06 NOTE — ED Provider Notes (Addendum)
CSN: [id number]     Arrival date & time 06/06/16  0000 History  By signing my name below, I, Diane Cook, attest that this documentation has been prepared under the direction and in the presence of Diane Booze, MD. Electronically Signed: Phillis Cook, ED Scribe. 06/06/2016. 12:37 AM.   Chief Complaint  Patient presents with  . Headache   The history is provided by the patient. No language interpreter was used.  HPI Comments: Diane Cook is a 60 y.o. Female with a hx of HTN, stroke, Hepatitis C, and alcoholism brought in by EMS who presents to the Emergency Department complaining of a gradually worsening headache onset one week ago. Pt reports associated "head-spinning" dizziness, generalized weakness, and an episode of syncope that lasted 25 minutes. Husband states that her leg gave out on her and she fell. She states that she feels dizzy with standing. Husband states that the pt has been having swelling in her ankles and "her stomach is swollen." He reports that the pt has been diaphoretic and short of breath, which pt attributes to her asthma. Pt states that she drinks two 40 ounce beers a night. Pt's last drink was a few hours PTA. Family member believes that she has not drank any water today. She has not taken anything for her symptoms. She denies fever, chills, chest pain, chest pressure, nausea, vomiting, diarrhea, melena, hematochezia, dysuria, or pain anywhere.   Past Medical History  Diagnosis Date  . Hypertension Dx 2004  . Arthritis Dx 2014  . Stroke [facility]) Dx 1997  . Asthma Dx 2001  . H/O non anemic vitamin B12 deficiency   . Hepatitis C Dx 2011  . Alcoholism (HCC)   . Family history of adverse reaction to anesthesia     sister & daughter have a hard time waking up   . Bipolar disorder (HCC) [id number]   Past Surgical History  Procedure Laterality Date  . Abdominal hysterectomy    . Cholecystectomy     Family History  Problem Relation Age of Onset  . Diabetes Mother   .  Diabetes Daughter    Social History  Substance Use Topics  . Smoking status: Former Smoker -- 0.25 packs/day for 40 years    Types: Cigarettes    Quit date: 05/09/2015  . Smokeless tobacco: Never Used  . Alcohol Use: No     Comment: Patient drinks 6 40oz per day, 1 pint of liquor daily; Last drink 05/09/15   OB History    No data available     Review of Systems  Gastrointestinal: Negative for nausea, vomiting, abdominal pain and diarrhea.  Neurological: Positive for dizziness, syncope, weakness and headaches.  All other systems reviewed and are negative.  Allergies  Review of patient's allergies indicates no known allergies.  Home Medications   Prior to Admission medications   Medication Sig Start Date End Date Taking? Authorizing Provider  albuterol (PROVENTIL HFA;VENTOLIN HFA) 108 (90 Base) MCG/ACT inhaler Inhale 2 puffs into the lungs every 6 (six) hours as needed for wheezing or shortness of breath. 02/13/16   Josalyn Funches, MD  ammonium lactate (LAC-HYDRIN) 12 % cream Apply topically as needed for dry skin. 02/13/16   Josalyn Funches, MD  fluticasone (FLONASE) 50 MCG/ACT nasal spray Place 2 sprays into both nostrils daily. 11/18/13   Kathrynn Speed, PA-C  folic acid (FOLVITE) 1 MG tablet Take 1 tablet (1 mg total) by mouth daily. 07/31/15   Dessa Phi, MD  furosemide (LASIX) 40 MG tablet Take  1 tablet (40 mg total) by mouth daily. 02/13/16   Dessa Phi, MD  Multiple Vitamins-Minerals (MULTIVITAMIN) tablet Take 1 tablet by mouth daily. 07/31/15   Josalyn Funches, MD  nicotine (NICODERM CQ) 7 mg/24hr patch Place 1 patch (7 mg total) onto the skin daily. 04/30/15   Jaclyn Shaggy, MD  potassium chloride SA (K-DUR,KLOR-CON) 10 MEQ tablet Take 1 tablet (10 mEq total) by mouth daily. 05/02/15   Jaclyn Shaggy, MD  spironolactone (ALDACTONE) 50 MG tablet Take 1 tablet (50 mg total) by mouth daily. 04/26/15   Albertine Grates, MD  thiamine 100 MG tablet Take 1 tablet (100 mg total) by mouth daily.  07/31/15   Dessa Phi, MD  traMADol (ULTRAM) 50 MG tablet Take 1 tablet (50 mg total) by mouth at bedtime as needed. 05/29/15   Josalyn Funches, MD   BP 109/63 mmHg  Pulse 73  Temp(Src) 97.3 F (36.3 C)  Resp 18  SpO2 97% Physical Exam  Constitutional: She is oriented to person, place, and time. She appears well-developed and well-nourished.  Morbidly obese  HENT:  Head: Normocephalic and atraumatic.  Eyes: EOM are normal. Pupils are equal, round, and reactive to light. Scleral icterus is present.  Moderate scleral icterus  Neck: Normal range of motion. Neck supple. No JVD present.  Cardiovascular: Normal rate and regular rhythm.  Exam reveals no gallop and no friction rub.   Murmur heard.  Systolic murmur is present with a grade of 2/6  2/6 systolic ejection murmur at the base with radiation to the neck  Pulmonary/Chest: Effort normal and breath sounds normal. She has no wheezes. She has no rales. She exhibits no tenderness.  Abdominal: Soft. Bowel sounds are normal. She exhibits no distension and no mass. There is no tenderness.  Musculoskeletal: Normal range of motion. She exhibits edema.  3+ pitting edema to the bilateral legs  Lymphadenopathy:    She has no cervical adenopathy.  Neurological: She is alert and oriented to person, place, and time. No cranial nerve deficit. She exhibits normal muscle tone. Coordination normal.  Skin: Skin is warm and dry. No rash noted.  Psychiatric: She has a normal mood and affect. Her behavior is normal. Judgment and thought content normal.  Nursing note and vitals reviewed.   ED Course  Procedures (including critical care time) DIAGNOSTIC STUDIES: Oxygen Saturation is 97% on RA, normal by my interpretation.    COORDINATION OF CARE: 12:33 AM-Discussed treatment plan which includes labs with pt at bedside and pt agreed to plan.    Labs Review Results for orders placed or performed during the hospital encounter of 06/06/16   Comprehensive metabolic panel  Result Value Ref Range   Sodium 137 135 - 145 mmol/L   Potassium 3.6 3.5 - 5.1 mmol/L   Chloride 111 101 - 111 mmol/L   CO2 22 22 - 32 mmol/L   Glucose, Bld 94 65 - 99 mg/dL   BUN 19 6 - 20 mg/dL   Creatinine, Ser 1.61 (H) 0.44 - 1.00 mg/dL   Calcium 7.6 (L) 8.9 - 10.3 mg/dL   Total Protein 6.8 6.5 - 8.1 g/dL   Albumin 1.3 (L) 3.5 - 5.0 g/dL   AST 62 (H) 15 - 41 U/L   ALT 28 14 - 54 U/L   Alkaline Phosphatase 119 38 - 126 U/L   Total Bilirubin 4.7 (H) 0.3 - 1.2 mg/dL   GFR calc non Af Amer 43 (L) >60 mL/min   GFR calc Af Amer 49 (L) >60  mL/min   Anion gap 4 (L) 5 - 15  CBC with Differential  Result Value Ref Range   WBC 5.0 4.0 - 10.5 K/uL   RBC 1.94 (L) 3.87 - 5.11 MIL/uL   Hemoglobin 7.3 (L) 12.0 - 15.0 g/dL   HCT 81.121.0 (L) 91.436.0 - 78.246.0 %   MCV 108.2 (H) 78.0 - 100.0 fL   MCH 37.6 (H) 26.0 - 34.0 pg   MCHC 34.8 30.0 - 36.0 g/dL   RDW 95.619.6 (H) 21.311.5 - 08.615.5 %   Platelets 54 (L) 150 - 400 K/uL   Neutrophils Relative % 43 %   Neutro Abs 2.1 1.7 - 7.7 K/uL   Lymphocytes Relative 40 %   Lymphs Abs 2.0 0.7 - 4.0 K/uL   Monocytes Relative 11 %   Monocytes Absolute 0.5 0.1 - 1.0 K/uL   Eosinophils Relative 7 %   Eosinophils Absolute 0.3 0.0 - 0.7 K/uL   Basophils Relative 1 %   Basophils Absolute 0.0 0.0 - 0.1 K/uL  Troponin I  Result Value Ref Range   Troponin I <0.03 <0.031 ng/mL  Ethanol  Result Value Ref Range   Alcohol, Ethyl (B) 17 (H) <5 mg/dL  Urinalysis, Routine w reflex microscopic  Result Value Ref Range   Color, Urine ORANGE (A) YELLOW   APPearance CLOUDY (A) CLEAR   Specific Gravity, Urine 1.023 1.005 - 1.030   pH 5.5 5.0 - 8.0   Glucose, UA NEGATIVE NEGATIVE mg/dL   Hgb urine dipstick TRACE (A) NEGATIVE   Bilirubin Urine LARGE (A) NEGATIVE   Ketones, ur NEGATIVE NEGATIVE mg/dL   Protein, ur NEGATIVE NEGATIVE mg/dL   Nitrite POSITIVE (A) NEGATIVE   Leukocytes, UA SMALL (A) NEGATIVE  Protime-INR  Result Value Ref Range    Prothrombin Time 24.8 (H) 11.6 - 15.2 seconds   INR 2.27 (H) 0.00 - 1.49  Ammonia  Result Value Ref Range   Ammonia 86 (H) 9 - 35 umol/L  Urine microscopic-add on  Result Value Ref Range   Squamous Epithelial / LPF NONE SEEN NONE SEEN   WBC, UA 6-30 0 - 5 WBC/hpf   RBC / HPF NONE SEEN 0 - 5 RBC/hpf   Bacteria, UA RARE (A) NONE SEEN   Casts WBC CAST (A) NEGATIVE  I-Stat CG4 Lactic Acid, ED  Result Value Ref Range   Lactic Acid, Venous 2.16 (HH) 0.5 - 2.0 mmol/L   Comment NOTIFIED PHYSICIAN   POC occult blood, ED Provider will collect  Result Value Ref Range   Fecal Occult Bld POSITIVE (A) NEGATIVE   I have personally reviewed and evaluated these images and lab results as part of my medical decision-making.   EKG Interpretation   Date/Time:  Saturday June 06 2016 01:06:23 EDT Ventricular Rate:  78 PR Interval:  168 QRS Duration: 99 QT Interval:  416 QTC Calculation: 474 R Axis:   12 Text Interpretation:  Sinus rhythm Normal ECG When compared with ECG of  05/29/2015, No significant change was found Confirmed by [facility]  MD, Asti Mackley  ([id number]) on 06/06/2016 1:29:17 AM      MDM   Final diagnoses:  Upper gastrointestinal bleeding  Anemia due to blood loss  Acute kidney injury (nontraumatic) (HCC)  Jaundice  Elevated lactic acid level  Alcoholic cirrhosis of liver without ascites (HCC)  Thrombocytopenia (HCC)  Elevated INR (international normalized ratio)    Syncopal episode which seems to be orthostatic, based on history. Old records are reviewed and she does have a known history  of diastolic heart failure, alcohol abuse, states cirrhosis with thrombocytopenia, mild anemia. Laboratory workup for syncope was initiated. Will also check ethanol level and ammonia level.  Ammonia level is only minimally elevated and not felt to be clinically significant. INR is elevated consistent with cirrhosis., Cytopenias also consistent with cirrhosis. Hemoglobin is noted to have dropped  significantly since it was last checked 4 months ago. Based on this, rectal exam was done which showed dark stool which was Hemoccult positive. When orthostatic vital signs were attempted, blood pressure dropped to 80 systolic with sitting. She will need to be admitted. Blood is sent for type and screen. She may need blood and/or fresh frozen plasma. GI consultation will be needed to assess whether she has varices. Case is discussed with Dr. Antionette Char [facility] who agrees to admit the patient.  I personally performed the services described in this documentation, which was scribed in my presence. The recorded information has been reviewed and is accurate.      Diane Booze, MD 06/06/16 1610  Diane Booze, MD 06/06/16 [phone]

## 2016-06-06 NOTE — ED Notes (Signed)
Per EMS- Pt. C/o of having headaches for a week. Fell into a sitting position tonight, no injuries noted.

## 2016-06-06 NOTE — H&P (Signed)
History and Physical    Diane Cook ZOX:[id number] DOB: December 23, 1956 DOA: 06/06/2016  PCP: Lora Paula, MD   Patient coming from: Home   Chief Complaint: Lightheadedness, near-syncope   HPI: Diane Cook is a 60 y.o. female with medical history significant for chronic hepatitis C, alcohol abuse, and liver cirrhosis presenting to the emergency department with 1 week of headaches and lightheadedness, culminating with a near syncopal episode just prior to arrival. Patient reports ongoing alcohol abuse, though she has cut back to approximately 8 drinks per day. She had been in her usual state until approximately one week ago when she developed mild headaches and lightheadedness, particularly upon standing. Symptoms continued to progress and today she became very lightheaded and fell backwards, being caught by her husband and eased to the ground. She is not sure if there was a momentary loss of consciousness, but there was no head strike and no resulting pain. She denies recent fevers or chills, denies nausea or vomiting, denies change in bowel habits, denies melena or hematochezia, but reports light blood streaking on the toilet paper. She has not been treated for hepatitis C and has never undergone EGD. Of note some minimal gastric and esophageal varices were noted on abdominal CT from June 2016. Patient denies history of GI bleed.  ED Course: Upon arrival to the ED, patient is found to be afebrile, saturating well on room air, with blood pressure soft but stable. EKG features a normal sinus rhythm and troponin is undetectable. Orthostatic vital signs were attempted, but pressures dropped to 80/54 upon sitting up. Chemistry panel is notable for serum creatinine 1.31, up from 0.90 in February of this year. Also notable on the chemistry panel are serum albumin of 1.3 and total bilirubin of 4.7. Chemistry panel features hemoglobin of 7.3, down from 10.8 in February of this year, and platelet count  54,000, down from 65,000 in February. MCV is elevated to a value of 108.2. Lactic acid is also elevated at 2.16 and there is a coagulopathy present with INR of 2.27. Ammonia level is elevated to 86, and ethanol to 17. DRE reveals FOBT positive stool. Type and screen was performed and the patient was given a 1 L normal saline bolus in the emergency department. She remained hemodynamically stable in the ED and there was no active blood loss witnessed. Patient will be admitted to the hospital for ongoing evaluation and management of symptomatic anemia with presumed GI bleed and AKI among other problems.   Review of Systems:  All other systems reviewed and apart from HPI, are negative.  Past Medical History  Diagnosis Date  . Hypertension Dx 2004  . Arthritis Dx 2014  . Stroke [facility]) Dx 1997  . Asthma Dx 2001  . H/O non anemic vitamin B12 deficiency   . Hepatitis C Dx 2011  . Alcoholism (HCC)   . Family history of adverse reaction to anesthesia     sister & daughter have a hard time waking up   . Bipolar disorder (HCC) [id number]    Past Surgical History  Procedure Laterality Date  . Abdominal hysterectomy    . Cholecystectomy       reports that she quit smoking about 12 months ago. Her smoking use included Cigarettes. She has a 10 pack-year smoking history. She has never used smokeless tobacco. She reports that she does not drink alcohol or use illicit drugs.  No Known Allergies  Family History  Problem Relation Age of Onset  . Diabetes Mother   .  Diabetes Daughter      Prior to Admission medications   Medication Sig Start Date End Date Taking? Authorizing Provider  albuterol (PROVENTIL HFA;VENTOLIN HFA) 108 (90 Base) MCG/ACT inhaler Inhale 2 puffs into the lungs every 6 (six) hours as needed for wheezing or shortness of breath. 02/13/16  Yes Josalyn Funches, MD  ammonium lactate (LAC-HYDRIN) 12 % cream Apply topically as needed for dry skin. 02/13/16  Yes Josalyn Funches, MD    fluticasone (FLONASE) 50 MCG/ACT nasal spray Place 2 sprays into both nostrils daily. 11/18/13  Yes Robyn M Hess, PA-C  folic acid (FOLVITE) 1 MG tablet Take 1 tablet (1 mg total) by mouth daily. 07/31/15  Yes Josalyn Funches, MD  furosemide (LASIX) 40 MG tablet Take 1 tablet (40 mg total) by mouth daily. 02/13/16  Yes Josalyn Funches, MD  naproxen (NAPROSYN) 500 MG tablet Take 500 mg by mouth 2 (two) times daily as needed (for pain/headache.).   Yes Historical Provider, MD  spironolactone (ALDACTONE) 50 MG tablet Take 1 tablet (50 mg total) by mouth daily. 04/26/15  Yes Albertine GratesFang Xu, MD  Multiple Vitamins-Minerals (MULTIVITAMIN) tablet Take 1 tablet by mouth daily. Patient not taking: Reported on 06/06/2016 07/31/15   Dessa PhiJosalyn Funches, MD  potassium chloride SA (K-DUR,KLOR-CON) 10 MEQ tablet Take 1 tablet (10 mEq total) by mouth daily. Patient not taking: Reported on 06/06/2016 05/02/15   Jaclyn ShaggyEnobong Amao, MD  thiamine 100 MG tablet Take 1 tablet (100 mg total) by mouth daily. Patient not taking: Reported on 06/06/2016 07/31/15   Dessa PhiJosalyn Funches, MD    Physical Exam: Filed Vitals:   06/06/16 0228 06/06/16 0229 06/06/16 0309 06/06/16 0312  BP: 107/64 107/64 110/66 103/63  Pulse: 79 80 81 84  Temp:      Resp: 17 12 15 19   SpO2: 97% 99% 98% 98%      Constitutional: NAD, calm, comfortable.  Eyes: PERTLA, scleral icterus ENMT: Mucous membranes are moist. Posterior pharynx clear of any exudate or lesions.   Neck: normal, supple, no masses, no thyromegaly Respiratory: Bibasilar crackles. Normal respiratory effort. No accessory muscle use.  Cardiovascular: S1 & S2 heard, regular rate and rhythm, no significant murmurs / rubs / gallops. Bilateral LE's markedly edematous to hips. Abdomen: No distension, no tenderness, no masses palpated. Bowel sounds normal.  Musculoskeletal: no clubbing / cyanosis. No joint deformity upper and lower extremities. Normal muscle tone.  Skin: no significant rashes, lesions, ulcers.  Warm, dry, well-perfused. Jaundiced Neurologic: CN 2-12 grossly intact. Sensation intact, DTR normal. Strength 5/5 in all 4 limbs.  Psychiatric: Normal judgment and insight. Alert and oriented x 3. Normal mood and affect.     Labs on Admission: I have personally reviewed following labs and imaging studies  CBC:  Recent Labs Lab 06/06/16 0050  WBC 5.0  NEUTROABS 2.1  HGB 7.3*  HCT 21.0*  MCV 108.2*  PLT 54*   Basic Metabolic Panel:  Recent Labs Lab 06/06/16 0050  NA 137  K 3.6  CL 111  CO2 22  GLUCOSE 94  BUN 19  CREATININE 1.31*  CALCIUM 7.6*   GFR: CrCl cannot be calculated (Unknown ideal weight.). Liver Function Tests:  Recent Labs Lab 06/06/16 0050  AST 62*  ALT 28  ALKPHOS 119  BILITOT 4.7*  PROT 6.8  ALBUMIN 1.3*   No results for input(s): LIPASE, AMYLASE in the last 168 hours.  Recent Labs Lab 06/06/16 0050  AMMONIA 86*   Coagulation Profile:  Recent Labs Lab 06/06/16 0050  INR 2.27*  Cardiac Enzymes:  Recent Labs Lab 06/06/16 0050  TROPONINI <0.03   BNP (last 3 results) No results for input(s): PROBNP in the last 8760 hours. HbA1C: No results for input(s): HGBA1C in the last 72 hours. CBG: No results for input(s): GLUCAP in the last 168 hours. Lipid Profile: No results for input(s): CHOL, HDL, LDLCALC, TRIG, CHOLHDL, LDLDIRECT in the last 72 hours. Thyroid Function Tests: No results for input(s): TSH, T4TOTAL, FREET4, T3FREE, THYROIDAB in the last 72 hours. Anemia Panel: No results for input(s): VITAMINB12, FOLATE, FERRITIN, TIBC, IRON, RETICCTPCT in the last 72 hours. Urine analysis:    Component Value Date/Time   COLORURINE ORANGE* 06/06/2016 0145   APPEARANCEUR CLOUDY* 06/06/2016 0145   LABSPEC 1.023 06/06/2016 0145   PHURINE 5.5 06/06/2016 0145   GLUCOSEU NEGATIVE 06/06/2016 0145   HGBUR TRACE* 06/06/2016 0145   BILIRUBINUR LARGE* 06/06/2016 0145   KETONESUR NEGATIVE 06/06/2016 0145   PROTEINUR NEGATIVE 06/06/2016  0145   UROBILINOGEN 1.0 04/25/2015 1355   NITRITE POSITIVE* 06/06/2016 0145   LEUKOCYTESUR SMALL* 06/06/2016 0145   Sepsis Labs: (procalcitonin:4,lacticidven:4) )No results found for this or any previous visit (from the past 240 hour(s)).   Radiological Exams on Admission: No results found.  EKG: Independently reviewed. Normal sinus rhythm  Assessment/Plan  1. GIB with symptomatic anemia  - Hgb 7.3 on admission, down from 10.8 in February 2017  - FOBT +  - No blood thinners or antiplatelets  - Known cirrhotic without hx of EGD, but minimal gastric and esophageal varices noted on CT abd from June 2016  - Not active bleeding in ED  - Treat with Protonix and octreotide boluses, infusions  - Vit K given, 10 mg IV, in light of coagulopathy  - Rocephin initiated as prophylactic   - Type and screen done; 1 unit pRBCs ordered for immediate transfusion, blood bank asked to keep 2 ahead - RN asked to order post-transfusion H&H   2. Cirrhosis with chronic alcoholism and Hep C  - MELD score 23 on admission  - Has not undergone EGD, but minimal gastric and esophageal varices noted on CT abd from June 2016  - Denies hx of SBP or encephalopathy  - Hep C has not been treated per PCP notes; alcohol abuse is ongoing  - Managed with aldactone and Lasix at home - She is markedly edematous on admission, but there is also evidence of intravascular depletion with orthostasis and AKI  - Albumin is only 1.3 on admission; supplementing with 25 g q6h for now with Lasix 20 mg IV BID, continue aldactone  - Ammonia elevated to 86, starting lactulose 20g BID   3. Coagulopathy of liver disease  - INR is prolonged to 2.27 on admission  - Treated with vit K 10 mg x1 in light of suspected GIB with anemia   4. AKI  - SCr 1.31 on admission, up from 0.90 in February 2016  - Uncertain etiology, hepatorenal possible, will check urine studies  - Treating with albumin as above, octreotide may also be  useful here  - Consider addition of midodrine if urine studies suggestive of hepatorenal syndrome   - Avoiding nephrotoxins where possible - Repeat chem panel tomorrow   5. Alcohol abuse  - Counseled pt extensively on critical importance of abstinence and she seems somewhat receptive, though scared  - Husband and daughter at bedside seem to be dedicated to helping her achieve abstinence  - Monitor with CIWA and Ativan    DVT prophylaxis: SCD  Code  Status: Full  Family Communication: Husband and daughter updated at bedside  Disposition Plan: Admit to stepdown  Consults called: None  Admission status: Inpatient     Briscoe Deutscher, MD [facility] Pager [phone]  If 7PM-7AM, please contact night-coverage [email or web address] Password [email or web address]  06/06/2016, 3:52 AM

## 2016-06-06 NOTE — ED Notes (Signed)
Bed: WA17 Expected date:  Expected time:  Means of arrival:  Comments: EMS [age] F headache, dizziness

## 2016-06-06 NOTE — ED Notes (Signed)
Preston FleetingGlick, EDP given lactic results.

## 2016-06-06 NOTE — Progress Notes (Signed)
PROGRESS NOTE        PATIENT DETAILS Name: Diane Cook Age: 60 y.o. Sex: female Date of Birth: January 23, 1956 Admit Date: 06/06/2016 Admitting Physician Briscoe Deutscher, MD ZOX:[id number], Ellison Carwin, MD  Brief Narrative: Patient is a 60 y.o. female cirrhosis, Hepatitis C, ongoing alcohol abuse  presenting with lightheadedness and presyncope, found to have hemoglobin of 7.3.  Subjective: No further melena since yesterday.  Assessment/Plan: Principal Problem: Acute upper gastrointestinal bleeding: Likely secondary to either variceal/portal gastropathy related bleeding or ulcer related bleeding (NSAID use prior to this admission). Continue PPI and octreotide infusion, gastric urology consulted, planning on endoscopic evaluation in the next few days.  Active Problems: Acute blood loss anemia: Secondary to above, transfused 1 unit of PRBC on admission, hemoglobin stable at 7.5, will go ahead and transfuse 2 more units of PRBC. Follow closely.  Acute kidney injury: Likely prerenal azotemia- secondary to above, and diuretic use. Stop diuretics, hydrate, transfuse PRBC and follow.  Liver cirrhosis: Likely secondary to alcohol abuse and hepatitis C. MELD score 24.Per gastroenterology-not a liver transplant candidate due to ongoing alcohol abuse.Resume Lasix and Aldactone in the next few days-when blood pressure more stable.  Thrombocytopenia: Secondary to underlying liver cirrhosis with hypersplenism. Follow.  Coagulopathy: Secondary to liver cirrhosis, given FFP on admission, follow-up.  Alcohol abuse: Last drink 6/9, no signs of withdrawal. Continue Ativan per protocol. Counseled extensively.  Volume overload: Likely secondary to liver cirrhosis and hypoalbuminemia, BP currently soft, for now hold diuretics-hydrate- will try and diurese when more stable.  Chronic hepatitis C: Refer to infectious disease clinic as outpatient.  Prominent intra-hepatic biliary ducts:  Seen on CT scan abdomen in June 2016-gastroenterology planning on MRCP at some point. Her LFTs are elevated-this is likely secondary to liver cirrhosis and alcohol abuse rather than this issue.  Morbid obesity  DVT Prophylaxis: SCD's  Code Status: Full code or DNR  Family Communication: Spouse at bedside  Disposition Plan: Remain inpatient-home sometime next week  Antimicrobial agents: IV Rocephin 6/10>>  Procedures: None  CONSULTS:  GI  Time spent: 25 minutes-Greater than 50% of this time was spent in counseling, explanation of diagnosis, planning of further management, and coordination of care.  MEDICATIONS: Anti-infectives    Start     Dose/Rate Route Frequency Ordered Stop   06/06/16 0415  cefTRIAXone (ROCEPHIN) 1 g in dextrose 5 % 50 mL IVPB     1 g 100 mL/hr over 30 Minutes Intravenous Daily 06/06/16 0352        Scheduled Meds: . sodium chloride   Intravenous Once  . acetaminophen  650 mg Oral Once  . albumin human  25 g Intravenous Q6H  . cefTRIAXone (ROCEPHIN)  IV  1 g Intravenous Q0600  . diphenhydrAMINE  25 mg Intravenous Once  . fluticasone  2 spray Each Nare Daily  . folic acid  1 mg Intravenous Daily  . furosemide  20 mg Intravenous Once  . lactulose  20 g Oral BID  . [START ON 06/09/2016] pantoprazole  40 mg Intravenous Q12H  . sodium chloride flush  3 mL Intravenous Q12H  . sodium chloride flush  3 mL Intravenous Q12H  . thiamine  100 mg Intravenous Daily   Continuous Infusions: . sodium chloride 75 mL/hr at 06/06/16 0830  . octreotide  (SANDOSTATIN)    IV infusion 50 mcg/hr (06/06/16 1000)  .  pantoprozole (PROTONIX) infusion 8 mg/hr (06/06/16 1000)   PRN Meds:.sodium chloride, albuterol, ammonium lactate, LORazepam, ondansetron **OR** ondansetron (ZOFRAN) IV, oxyCODONE, sodium chloride flush   PHYSICAL EXAM: Vital signs: Filed Vitals:   06/06/16 0732 06/06/16 0800 06/06/16 0900 06/06/16 1000  BP: 91/40 105/63 95/45 108/47  Pulse:        Temp: 98.2 F (36.8 C) 98.2 F (36.8 C)    TempSrc: Oral Oral    Resp: 20 23 18 18   Height:      Weight:      SpO2: 100% 100% 100% 100%   Filed Weights   06/06/16 0500  Weight: 172.8 kg (380 lb 15.3 oz)   Body mass index is 53.16 kg/(m^2).   Gen Exam: Awake and alert with clear speech. Not in any distress  Neck: Supple, No JVD.   Chest: B/L Clear.   CVS: S1 S2 Regular, no murmurs.  Abdomen: soft, BS +, non tender, non distended.  Extremities: ++ edema, lower extremities warm to touch. Neurologic: Non Focal.   Skin: No Rash or lesions   Wounds: N/A.   LABORATORY DATA: CBC:  Recent Labs Lab 06/06/16 0050 06/06/16 0930  WBC 5.0 4.6  NEUTROABS 2.1  --   HGB 7.3* 7.5*  HCT 21.0* 21.7*  MCV 108.2* 106.4*  PLT 54* 44*    Basic Metabolic Panel:  Recent Labs Lab 06/06/16 0050 06/06/16 0930  NA 137 137  K 3.6 4.0  CL 111 110  CO2 22 22  GLUCOSE 94 87  BUN 19 21*  CREATININE 1.31* 1.59*  CALCIUM 7.6* 7.7*    GFR: Estimated Creatinine Clearance: 64.6 mL/min (by C-G formula based on Cr of 1.59).  Liver Function Tests:  Recent Labs Lab 06/06/16 0050 06/06/16 0930  AST 62* 64*  ALT 28 29  ALKPHOS 119 104  BILITOT 4.7* 5.0*  PROT 6.8 6.8  ALBUMIN 1.3* 1.6*   No results for input(s): LIPASE, AMYLASE in the last 168 hours.  Recent Labs Lab 06/06/16 0050  AMMONIA 86*    Coagulation Profile:  Recent Labs Lab 06/06/16 0050  INR 2.27*    Cardiac Enzymes:  Recent Labs Lab 06/06/16 0050  TROPONINI <0.03    BNP (last 3 results) No results for input(s): PROBNP in the last 8760 hours.  HbA1C: No results for input(s): HGBA1C in the last 72 hours.  CBG:  Recent Labs Lab 06/06/16 0751  GLUCAP 95    Lipid Profile: No results for input(s): CHOL, HDL, LDLCALC, TRIG, CHOLHDL, LDLDIRECT in the last 72 hours.  Thyroid Function Tests: No results for input(s): TSH, T4TOTAL, FREET4, T3FREE, THYROIDAB in the last 72 hours.  Anemia  Panel: No results for input(s): VITAMINB12, FOLATE, FERRITIN, TIBC, IRON, RETICCTPCT in the last 72 hours.  Urine analysis:    Component Value Date/Time   COLORURINE ORANGE* 06/06/2016 0145   APPEARANCEUR CLOUDY* 06/06/2016 0145   LABSPEC 1.023 06/06/2016 0145   PHURINE 5.5 06/06/2016 0145   GLUCOSEU NEGATIVE 06/06/2016 0145   HGBUR TRACE* 06/06/2016 0145   BILIRUBINUR LARGE* 06/06/2016 0145   KETONESUR NEGATIVE 06/06/2016 0145   PROTEINUR NEGATIVE 06/06/2016 0145   UROBILINOGEN 1.0 04/25/2015 1355   NITRITE POSITIVE* 06/06/2016 0145   LEUKOCYTESUR SMALL* 06/06/2016 0145    Sepsis Labs: Lactic Acid, Venous    Component Value Date/Time   LATICACIDVEN 2.16* 06/06/2016 0101    MICROBIOLOGY: Recent Results (from the past 240 hour(s))  MRSA PCR Screening     Status: None   Collection Time: 06/06/16  4:04 AM  Result Value Ref Range Status   MRSA by PCR NEGATIVE NEGATIVE Final    Comment:        The GeneXpert MRSA Assay (FDA approved for NASAL specimens only), is one component of a comprehensive MRSA colonization surveillance program. It is not intended to diagnose MRSA infection nor to guide or monitor treatment for MRSA infections.     RADIOLOGY STUDIES/RESULTS: No results found.   LOS: 0 days   Jeoffrey Massed, MD  [facility] Pager:[phone]  If 7PM-7AM, please contact night-coverage [email or web address] Password [email or web address] 06/06/2016, 11:02 AM

## 2016-06-07 ENCOUNTER — Encounter (HOSPITAL_COMMUNITY): Payer: Self-pay | Admitting: Anesthesiology

## 2016-06-07 ENCOUNTER — Inpatient Hospital Stay (HOSPITAL_COMMUNITY): Payer: Medicaid Other

## 2016-06-07 DIAGNOSIS — D62 Acute posthemorrhagic anemia: Secondary | ICD-10-CM

## 2016-06-07 DIAGNOSIS — D696 Thrombocytopenia, unspecified: Secondary | ICD-10-CM

## 2016-06-07 LAB — CBC
HEMATOCRIT: 24.9 % — AB (ref 36.0–46.0)
Hemoglobin: 8.6 g/dL — ABNORMAL LOW (ref 12.0–15.0)
MCH: 36 pg — AB (ref 26.0–34.0)
MCHC: 34.5 g/dL (ref 30.0–36.0)
MCV: 104.2 fL — AB (ref 78.0–100.0)
Platelets: 50 10*3/uL — ABNORMAL LOW (ref 150–400)
RBC: 2.39 MIL/uL — ABNORMAL LOW (ref 3.87–5.11)
RDW: 23.6 % — AB (ref 11.5–15.5)
WBC: 4 10*3/uL (ref 4.0–10.5)

## 2016-06-07 LAB — PROTIME-INR
INR: 2.14 — AB (ref 0.00–1.49)
Prothrombin Time: 23.7 seconds — ABNORMAL HIGH (ref 11.6–15.2)

## 2016-06-07 LAB — COMPREHENSIVE METABOLIC PANEL
ALK PHOS: 92 U/L (ref 38–126)
ALT: 28 U/L (ref 14–54)
ANION GAP: 4 — AB (ref 5–15)
AST: 64 U/L — ABNORMAL HIGH (ref 15–41)
Albumin: 2.1 g/dL — ABNORMAL LOW (ref 3.5–5.0)
BUN: 25 mg/dL — ABNORMAL HIGH (ref 6–20)
CALCIUM: 7.9 mg/dL — AB (ref 8.9–10.3)
CO2: 22 mmol/L (ref 22–32)
Chloride: 111 mmol/L (ref 101–111)
Creatinine, Ser: 2.12 mg/dL — ABNORMAL HIGH (ref 0.44–1.00)
GFR calc non Af Amer: 24 mL/min — ABNORMAL LOW (ref >60)
GFR, EST AFRICAN AMERICAN: 28 mL/min — AB (ref >60)
Glucose, Bld: 116 mg/dL — ABNORMAL HIGH (ref 65–99)
POTASSIUM: 3.9 mmol/L (ref 3.5–5.1)
SODIUM: 137 mmol/L (ref 135–145)
TOTAL PROTEIN: 7.4 g/dL (ref 6.5–8.1)
Total Bilirubin: 6.5 mg/dL — ABNORMAL HIGH (ref 0.3–1.2)

## 2016-06-07 LAB — CREATININE, URINE, RANDOM: CREATININE, URINE: 146.75 mg/dL

## 2016-06-07 LAB — NA AND K (SODIUM & POTASSIUM), RAND UR
POTASSIUM UR: 35 mmol/L
Sodium, Ur: 79 mmol/L

## 2016-06-07 MED ORDER — DEXTROSE 5 % IV SOLN
5.0000 mg | Freq: Once | INTRAVENOUS | Status: AC
  Administered 2016-06-07: 5 mg via INTRAVENOUS
  Filled 2016-06-07: qty 0.5

## 2016-06-07 MED ORDER — FUROSEMIDE 10 MG/ML IJ SOLN
60.0000 mg | Freq: Two times a day (BID) | INTRAMUSCULAR | Status: DC
  Administered 2016-06-07 – 2016-06-09 (×5): 60 mg via INTRAVENOUS
  Filled 2016-06-07 (×5): qty 6

## 2016-06-07 MED ORDER — SODIUM CHLORIDE 0.9 % IV SOLN
INTRAVENOUS | Status: DC
Start: 2016-06-07 — End: 2016-06-10
  Administered 2016-06-07: 18:00:00 via INTRAVENOUS

## 2016-06-07 NOTE — Evaluation (Signed)
Physical Therapy Evaluation Patient Details Name: Jamese Trauger MRN: [id number] DOB: 02-Feb-1956 Today's Date: 06/07/2016   History of Present Illness  60 yo female admitted with acute UGIB. hx of CHF, HTN CVA, ETOH abuse, bipolar d/o, arthritis.   Clinical Impression  On eval, pt required Mod assist +2 to stand from bed in chair position. Pt stood for ~30s with RW. Pt is weak. Several near falls (LEs buckling) with nursing assisting her on/off BSC. Pt remains at risk for falls. Family present during session and assisted as needed. At this time, recommend ST rehab at SNF if pt/family are agreeable. If pt discharges home, recommend HHPT follow up if this is an option. Family is requesting a home health aide.     Follow Up Recommendations SNF;Supervision/Assistance - 24 hour (if pt/family agreeable. Otherwise, pt will need HHPT); Home Health Aide    Equipment Recommendations  Rolling walker with 5" wheels; 3in1 (bariatiric walker, 3n1)    Recommendations for Other Services OT consult     Precautions / Restrictions Precautions Precautions: Fall Restrictions Weight Bearing Restrictions: No      Mobility  Bed Mobility               General bed mobility comments: NT-placed bed in chair position to work on standing  Transfers Overall transfer level: Needs assistance Equipment used: Rolling walker (2 wheeled) Transfers: Sit to/from Stand Sit to Stand: Mod assist;+2 physical assistance;+2 safety/equipment         General transfer comment: Assist to rise, stabilize, control descent. 2 attempts to get to full standing. Pt was able to stand for ~30 seconds with RW.   Ambulation/Gait             General Gait Details: NT  Stairs            Wheelchair Mobility    Modified Rankin (Stroke Patients Only)       Balance Overall balance assessment: History of Falls;Needs assistance         Standing balance support: During functional activity Standing  balance-Leahy Scale: Poor                               Pertinent Vitals/Pain Pain Assessment: No/denies pain    Home Living Family/patient expects to be discharged to:: Private residence Living Arrangements: Spouse/significant other;Other relatives Available Help at Discharge: Family Type of Home: Apartment Home Access: Stairs to enter Entrance Stairs-Rails: None Entrance Stairs-Number of Steps: 1-2 Home Layout: One level Home Equipment: Cane - single point;Walker - standard      Prior Function Level of Independence: Independent with assistive device(s);Needs assistance   Gait / Transfers Assistance Needed: "furniture walks" throughout house  ADL's / Homemaking Assistance Needed: sponge bathing a few days a week        Hand Dominance        Extremity/Trunk Assessment   Upper Extremity Assessment: Generalized weakness           Lower Extremity Assessment: Generalized weakness      Cervical / Trunk Assessment: Normal  Communication   Communication: No difficulties  Cognition Arousal/Alertness: Awake/alert Behavior During Therapy: WFL for tasks assessed/performed   Area of Impairment: Problem solving;Safety/judgement         Safety/Judgement: Decreased awareness of safety;Decreased awareness of deficits   Problem Solving: Requires tactile cues;Requires verbal cues      General Comments      Exercises  Assessment/Plan    PT Assessment Patient needs continued PT services  PT Diagnosis Difficulty walking;Generalized weakness   PT Problem List Obesity;Decreased strength;Decreased activity tolerance;Decreased balance;Decreased mobility;Decreased knowledge of use of DME;Decreased cognition  PT Treatment Interventions DME instruction;Gait training;Functional mobility training;Therapeutic activities;Patient/family education;Balance training;Therapeutic exercise   PT Goals (Current goals can be found in the Care Plan section) Acute  Rehab PT Goals Patient Stated Goal: to get strength back PT Goal Formulation: With patient/family Time For Goal Achievement: 06/21/16 Potential to Achieve Goals: Fair    Frequency Min 3X/week   Barriers to discharge        Co-evaluation               End of Session   Activity Tolerance: Patient tolerated treatment well Patient left: in bed;with call bell/phone within reach;with bed alarm set;with family/visitor present           Time: [id number] PT Time Calculation (min) (ACUTE ONLY): 35 min   Charges:   PT Evaluation $PT Eval Low Complexity: 1 Procedure PT Treatments $Gait Training: 8-22 mins   PT G Codes:        Rebeca AlertJannie Analeise Mccleery, MPT Pager: [phone]

## 2016-06-07 NOTE — Progress Notes (Signed)
Subjective: No abdominal pain. No hematemesis. No melena.  Objective: Vital signs in last 24 hours: Temp:  [97.5 F (36.4 C)-98.4 F (36.9 C)] 97.7 F (36.5 C) (06/11 0800) Resp:  [11-37] 15 (06/11 0800) BP: (97-152)/(41-77) 118/45 mmHg (06/11 0800) SpO2:  [99 %-100 %] 100 % (06/11 0800) Weight:  [176.5 kg (389 lb 1.8 oz)] 176.5 kg (389 lb 1.8 oz) (06/11 0433) Weight change: 3.7 kg (8 lb 2.5 oz)   PE: GEN:  Obese, older-appearing than stated age, NAD ABD:  Soft, protuberant, non-tender  Lab Results: CBC    Component Value Date/Time   WBC 4.0 06/07/2016 0334   RBC 2.39* 06/07/2016 0334   RBC 2.17* 04/24/2015 0505   HGB 8.6* 06/07/2016 0334   HCT 24.9* 06/07/2016 0334   PLT 50* 06/07/2016 0334   MCV 104.2* 06/07/2016 0334   MCH 36.0* 06/07/2016 0334   MCHC 34.5 06/07/2016 0334   RDW 23.6* 06/07/2016 0334   LYMPHSABS 2.0 06/06/2016 0050   MONOABS 0.5 06/06/2016 0050   EOSABS 0.3 06/06/2016 0050   BASOSABS 0.0 06/06/2016 0050   CMP     Component Value Date/Time   NA 137 06/07/2016 0334   K 3.9 06/07/2016 0334   CL 111 06/07/2016 0334   CO2 22 06/07/2016 0334   GLUCOSE 116* 06/07/2016 0334   BUN 25* 06/07/2016 0334   CREATININE 2.12* 06/07/2016 0334   CREATININE 0.90 02/13/2016 1002   CALCIUM 7.9* 06/07/2016 0334   PROT 7.4 06/07/2016 0334   ALBUMIN 2.1* 06/07/2016 0334   AST 64* 06/07/2016 0334   ALT 28 06/07/2016 0334   ALKPHOS 92 06/07/2016 0334   BILITOT 6.5* 06/07/2016 0334   GFRNONAA 24* 06/07/2016 0334   GFRNONAA 69 02/13/2016 1002   GFRAA 28* 06/07/2016 0334   GFRAA 80 02/13/2016 1002   INR 2.14  Assessment:  1. Melena, for one week. No hematemesis or hematochezia. BUN normal. Suspect slow bleed from portal gastropathy. History not compatible with variceal bleeding. Ulcer possible given NSAID use but, again, BUN has been normal. No evidence of rampant destabilizing GI tract bleeding. 2. Cirrhosis. Multifactorial (alcohol and Hepatitis  C). MELD 24. Not liver transplant candidate due to ongoing alcohol abuse. 3. Coagulopathy, not on anticoagulation, likely from hepatic synthetic dysfunction. 4. Anemia. Likely component of both acute blood loss as well as bone marrow suppression from alcohol use. 5. Thrombocytopenia, likely from portal hypertension from cirrhosis. 6. Obesity. 7. Alcohol abuse, ongoing. 8. Calcifications in hepatic hilum, post-cholecystectomy. Prominence of biliary system. Patient has no abdominal pain, and I suspect her elevated LFTs are from her cirrhosis (HCV and ongoing alcohol use), doubt any significant biliary tract stone component.  Plan:  1.  Tentatively plan EGD tomorrow with propofol, pending anesthesia availability AND improvement of coagulopathy (NEED INR </= 1.7). 2.  Would also benefit from transfusion of 2 units platelets within a few hours of her procedure. 3.  Continue PPI and medical therapy. 4.  Soft diet ok, NPO after midnight. 5.  Risks (bleeding, infection, bowel perforation that could require surgery, sedation-related changes in cardiopulmonary systems), benefits (identification and possible treatment of source of symptoms, exclusion of certain causes of symptoms), and alternatives (watchful waiting, radiographic imaging studies, empiric medical treatment) of upper endoscopy (EGD) were explained to patient/family in detail and patient wishes to proceed.   Freddy JakschOUTLAW,Pepe Mineau M 06/07/2016, 9:47 AM   Pager [phone] If no answer or after 5 PM call [phone]

## 2016-06-07 NOTE — Progress Notes (Signed)
PROGRESS NOTE        PATIENT DETAILS Name: Diane Cook Age: 60 y.o. Sex: female Date of Birth: 1956-03-21 Admit Date: 06/06/2016 Admitting Physician Briscoe Deutscherimothy S Opyd, MD ZOX:[id number]:[id number], Ellison CarwinJOSALYN C, MD  Brief Narrative: Patient is a 60 y.o. female cirrhosis, Hepatitis C, ongoing alcohol abuse  presenting with lightheadedness and presyncope, found to have hemoglobin of 7.3.  Subjective: No further melena since since admission, had green colored stools last night. Appears significantly volume overloaded on exam.  Assessment/Plan: Principal Problem: Acute upper gastrointestinal bleeding: Likely secondary to either variceal/portal gastropathy related bleeding or ulcer related bleeding (NSAID use prior to this admission). Continue PPI and octreotide infusion, GI consulted, planning on endoscopic evaluation tomorrow.   Active Problems: Acute blood loss anemia: Secondary to above, transfused  3 unit of PRBC so far, hemoglobin appears stable at 8.6., continue to follow closely.   Acute renal failure: Likely prerenal azotemia- secondary to above, NSAID and diuretic use. Urine sodium 79, and argues against hepatorenal. She appears significantly volume overloaded with 2+ pitting edema, weight has increased to 389 pounds. She had very soft blood pressures on admission and was resuscitated with IV fluids and PRBC transfusion, diuretics were on hold. Since she is  volume overloaded, stop IV fluids, start Lasix. Will place Foley catheter, check renal ultrasound, if renal function continues to deteriorate, will then consult nephrology  Liver cirrhosis: Likely secondary to alcohol abuse and hepatitis C. MELD score 24.Per gastroenterology-not a liver transplant candidate due to ongoing alcohol abuse. Remains on prophylactic IV Rocephin given GI bleeding.  Thrombocytopenia: Secondary to underlying liver cirrhosis with hypersplenism. Follow.  Coagulopathy: Secondary to liver cirrhosis,  given vitamin K on admission, follow INR.  Alcohol abuse: Last drink 6/9, no signs of withdrawal. Continue Ativan per protocol. Counseled extensively.  Volume overload: Likely secondary to liver cirrhosis and hypoalbuminemia, BP much better-was soft yesterday, will start diuresis   Chronic hepatitis C: Refer to infectious disease clinic as outpatient.  Prominent intra-hepatic biliary ducts: Seen on CT scan abdomen in June 2016-gastroenterology planning on MRCP at some point. Her LFTs are elevated-this is likely secondary to liver cirrhosis and alcohol abuse rather than this issue.  Morbid obesity  Deconditioning: Was extremely weak even prior to this admission-apparently was walking with the help of a walker, she was not very mobile because of weakness and anemia for 1 week prior to this admission. With her acute illness, she has become even more deconditioned, will obtain PT evaluation.  DVT Prophylaxis: SCD's  Code Status: Full code  Family Communication: None at bedside-had spoken with spouse yesterday. Patient currently awake, alert and understanding of above noted plan.  Disposition Plan: Remain inpatient-we will transfer to telemetry, not stable for discharge-suspect she requires 2-3 more days of hospitalization.  Antimicrobial agents: IV Rocephin 6/10>>  Procedures: None  CONSULTS:  GI  Time spent: 25 minutes-Greater than 50% of this time was spent in counseling, explanation of diagnosis, planning of further management, and coordination of care.  MEDICATIONS: Anti-infectives    Start     Dose/Rate Route Frequency Ordered Stop   06/06/16 0415  cefTRIAXone (ROCEPHIN) 1 g in dextrose 5 % 50 mL IVPB     1 g 100 mL/hr over 30 Minutes Intravenous Daily 06/06/16 0352        Scheduled Meds: . albumin human  25 g Intravenous Q6H  .  cefTRIAXone (ROCEPHIN)  IV  1 g Intravenous Q0600  . fluticasone  2 spray Each Nare Daily  . folic acid  1 mg Intravenous Daily  .  furosemide  60 mg Intravenous Q12H  . lactulose  20 g Oral BID  . [START ON 06/09/2016] pantoprazole  40 mg Intravenous Q12H  . sodium chloride flush  3 mL Intravenous Q12H  . sodium chloride flush  3 mL Intravenous Q12H  . thiamine  100 mg Intravenous Daily   Continuous Infusions: . octreotide  (SANDOSTATIN)    IV infusion 50 mcg/hr (06/07/16 0800)  . pantoprozole (PROTONIX) infusion 8 mg/hr (06/07/16 0800)   PRN Meds:.sodium chloride, albuterol, ammonium lactate, LORazepam, ondansetron **OR** ondansetron (ZOFRAN) IV, oxyCODONE, sodium chloride flush   PHYSICAL EXAM: Vital signs: Filed Vitals:   06/07/16 0200 06/07/16 0400 06/07/16 0433 06/07/16 0800  BP: 152/77 152/74  118/45  Pulse:      Temp:  98.2 F (36.8 C)  97.7 F (36.5 C)  TempSrc:  Oral  Oral  Resp: Height:      Weight:   176.5 kg (389 lb 1.8 oz)   SpO2: 100% 99%  100%   Filed Weights   06/06/16 0500 06/07/16 0433  Weight: 172.8 kg (380 lb 15.3 oz) 176.5 kg (389 lb 1.8 oz)   Body mass index is 54.29 kg/(m^2).   Gen Exam: Awake and alert with clear speech. Not in any distress  Neck: Supple, No JVD.   Chest: B/L Clear.   CVS: S1 S2 Regular, no murmurs.  Abdomen: soft, BS +, non tender, non distended. Obese abdomen Extremities: ++ edema, lower extremities warm to touch. Neurologic: Non Focal.   Skin: No Rash or lesions   Wounds: N/A.   LABORATORY DATA: CBC:  Recent Labs Lab 06/06/16 0050 06/06/16 0930 06/06/16 1710 06/07/16 0334  WBC 5.0 4.6 5.3 4.0  NEUTROABS 2.1  --   --   --   HGB 7.3* 7.5* 8.5* 8.6*  HCT 21.0* 21.7* 24.0* 24.9*  MCV 108.2* 106.4* 103.0* 104.2*  PLT 54* 44* 48* 50*    Basic Metabolic Panel:  Recent Labs Lab 06/06/16 0050 06/06/16 0930 06/07/16 0334  NA 137 137 137  K 3.6 4.0 3.9  CL 111 110 111  CO2 GLUCOSE 94 87 116*  BUN 19 21* 25*  CREATININE 1.31* 1.59* 2.12*  CALCIUM 7.6* 7.7* 7.9*    GFR: Estimated Creatinine Clearance: 49.1 mL/min  (by C-G formula based on Cr of 2.12).  Liver Function Tests:  Recent Labs Lab 06/06/16 0050 06/06/16 0930 06/07/16 0334  AST 62* 64* 64*  ALT ALKPHOS 119 104 92  BILITOT 4.7* 5.0* 6.5*  PROT 6.8 6.8 7.4  ALBUMIN 1.3* 1.6* 2.1*   No results for input(s): LIPASE, AMYLASE in the last 168 hours.  Recent Labs Lab 06/06/16 0050  AMMONIA 86*    Coagulation Profile:  Recent Labs Lab 06/06/16 0050 06/07/16 0334  INR 2.27* 2.14*    Cardiac Enzymes:  Recent Labs Lab 06/06/16 0050  TROPONINI <0.03    BNP (last 3 results) No results for input(s): PROBNP in the last 8760 hours.  HbA1C: No results for input(s): HGBA1C in the last 72 hours.  CBG:  Recent Labs Lab 06/06/16 0751  GLUCAP 95    Lipid Profile: No results for input(s): CHOL, HDL, LDLCALC, TRIG, CHOLHDL, LDLDIRECT in the last 72 hours.  Thyroid Function Tests: No results for input(s): TSH,  T4TOTAL, FREET4, T3FREE, THYROIDAB in the last 72 hours.  Anemia Panel: No results for input(s): VITAMINB12, FOLATE, FERRITIN, TIBC, IRON, RETICCTPCT in the last 72 hours.  Urine analysis:    Component Value Date/Time   COLORURINE ORANGE* 06/06/2016 0145   APPEARANCEUR CLOUDY* 06/06/2016 0145   LABSPEC 1.023 06/06/2016 0145   PHURINE 5.5 06/06/2016 0145   GLUCOSEU NEGATIVE 06/06/2016 0145   HGBUR TRACE* 06/06/2016 0145   BILIRUBINUR LARGE* 06/06/2016 0145   KETONESUR NEGATIVE 06/06/2016 0145   PROTEINUR NEGATIVE 06/06/2016 0145   UROBILINOGEN 1.0 04/25/2015 1355   NITRITE POSITIVE* 06/06/2016 0145   LEUKOCYTESUR SMALL* 06/06/2016 0145    Sepsis Labs: Lactic Acid, Venous    Component Value Date/Time   LATICACIDVEN 2.16* 06/06/2016 0101    MICROBIOLOGY: Recent Results (from the past 240 hour(s))  MRSA PCR Screening     Status: None   Collection Time: 06/06/16  4:04 AM  Result Value Ref Range Status   MRSA by PCR NEGATIVE NEGATIVE Final    Comment:        The GeneXpert MRSA Assay  (FDA approved for NASAL specimens only), is one component of a comprehensive MRSA colonization surveillance program. It is not intended to diagnose MRSA infection nor to guide or monitor treatment for MRSA infections.     RADIOLOGY STUDIES/RESULTS: No results found.   LOS: 1 day   Jeoffrey Massed, MD  [facility] Pager:[phone]  If 7PM-7AM, please contact night-coverage [email or web address] Password [email or web address] 06/07/2016, 9:22 AM

## 2016-06-07 NOTE — Progress Notes (Signed)
Patient arrived to floor and transferred into bari bed. Patient in no distress with no concerns or complaints at this time. I agree with previous RN assessment. Will continue to monitor.  Earnest ConroyBrooke M. Clelia CroftShaw, RN

## 2016-06-07 NOTE — Anesthesia Preprocedure Evaluation (Deleted)
Anesthesia Evaluation  Patient identified by MRN, date of birth, ID band Patient awake    Reviewed: Allergy & Precautions, H&P , NPO status , Patient's Chart, lab work & pertinent test results  Airway        Dental no notable dental hx.    Pulmonary asthma , former smoker,    Pulmonary exam normal        Cardiovascular hypertension, Pt. on medications      Neuro/Psych Bipolar Disorder CVA, Residual Symptoms    GI/Hepatic negative GI ROS, (+) Cirrhosis     substance abuse  alcohol use, Hepatitis -, C  Endo/Other  Morbid obesity  Renal/GU Renal InsufficiencyRenal disease  negative genitourinary   Musculoskeletal  (+) Arthritis , Osteoarthritis,    Abdominal   Peds  Hematology negative hematology ROS (+) anemia ,   Anesthesia Other Findings   Reproductive/Obstetrics negative OB ROS                             Anesthesia Physical Anesthesia Plan  ASA: III  Anesthesia Plan: MAC   Post-op Pain Management:    Induction: Intravenous  Airway Management Planned: Nasal Cannula  Additional Equipment:   Intra-op Plan:   Post-operative Plan:   Informed Consent: I have reviewed the patients History and Physical, chart, labs and discussed the procedure including the risks, benefits and alternatives for the proposed anesthesia with the patient or authorized representative who has indicated his/her understanding and acceptance.   Dental advisory given  Plan Discussed with: CRNA  Anesthesia Plan Comments:         Anesthesia Quick Evaluation

## 2016-06-08 ENCOUNTER — Encounter (HOSPITAL_COMMUNITY)
Admission: EM | Disposition: A | Discharge status: Skilled Nursing Facility | Payer: Self-pay | Source: Home / Self Care | Attending: Internal Medicine

## 2016-06-08 LAB — COMPREHENSIVE METABOLIC PANEL
ALBUMIN: 2.7 g/dL — AB (ref 3.5–5.0)
ALK PHOS: 74 U/L (ref 38–126)
ALT: 25 U/L (ref 14–54)
AST: 56 U/L — ABNORMAL HIGH (ref 15–41)
Anion gap: 5 (ref 5–15)
BUN: 25 mg/dL — ABNORMAL HIGH (ref 6–20)
CALCIUM: 8.3 mg/dL — AB (ref 8.9–10.3)
CO2: 23 mmol/L (ref 22–32)
CREATININE: 1.76 mg/dL — AB (ref 0.44–1.00)
Chloride: 111 mmol/L (ref 101–111)
GFR calc non Af Amer: 30 mL/min — ABNORMAL LOW (ref >60)
GFR, EST AFRICAN AMERICAN: 35 mL/min — AB (ref >60)
GLUCOSE: 84 mg/dL (ref 65–99)
Potassium: 3.6 mmol/L (ref 3.5–5.1)
SODIUM: 139 mmol/L (ref 135–145)
Total Bilirubin: 6.1 mg/dL — ABNORMAL HIGH (ref 0.3–1.2)
Total Protein: 7.1 g/dL (ref 6.5–8.1)

## 2016-06-08 LAB — CBC
HEMATOCRIT: 22.8 % — AB (ref 36.0–46.0)
HEMATOCRIT: 22.7 % — AB (ref 36.0–46.0)
HEMOGLOBIN: 7.9 g/dL — AB (ref 12.0–15.0)
HEMOGLOBIN: 7.9 g/dL — AB (ref 12.0–15.0)
MCH: 35.9 pg — AB (ref 26.0–34.0)
MCH: 36.4 pg — ABNORMAL HIGH (ref 26.0–34.0)
MCHC: 34.6 g/dL (ref 30.0–36.0)
MCHC: 34.8 g/dL (ref 30.0–36.0)
MCV: 103.6 fL — ABNORMAL HIGH (ref 78.0–100.0)
MCV: 104.6 fL — AB (ref 78.0–100.0)
PLATELETS: 43 10*3/uL — AB (ref 150–400)
Platelets: 41 10*3/uL — ABNORMAL LOW (ref 150–400)
RBC: 2.2 MIL/uL — AB (ref 3.87–5.11)
RBC: 2.17 MIL/uL — AB (ref 3.87–5.11)
RDW: 22.9 % — ABNORMAL HIGH (ref 11.5–15.5)
RDW: 22.5 % — ABNORMAL HIGH (ref 11.5–15.5)
WBC: 3.8 10*3/uL — ABNORMAL LOW (ref 4.0–10.5)
WBC: 3.6 10*3/uL — ABNORMAL LOW (ref 4.0–10.5)

## 2016-06-08 LAB — URINE CULTURE

## 2016-06-08 LAB — MAGNESIUM: Magnesium: 1.6 mg/dL — ABNORMAL LOW (ref 1.7–2.4)

## 2016-06-08 LAB — PROTIME-INR
INR: 2.19 — ABNORMAL HIGH (ref 0.00–1.49)
Prothrombin Time: 24.1 seconds — ABNORMAL HIGH (ref 11.6–15.2)

## 2016-06-08 LAB — GLUCOSE, CAPILLARY: Glucose-Capillary: 79 mg/dL (ref 65–99)

## 2016-06-08 LAB — UREA NITROGEN, URINE: UREA NITROGEN UR: 221 mg/dL

## 2016-06-08 SURGERY — CANCELLED PROCEDURE
Anesthesia: Monitor Anesthesia Care | Laterality: Left

## 2016-06-08 MED ORDER — SODIUM CHLORIDE 0.9 % IV SOLN
Freq: Once | INTRAVENOUS | Status: AC
  Administered 2016-06-08: 20:00:00 via INTRAVENOUS

## 2016-06-08 MED ORDER — PROPOFOL 10 MG/ML IV BOLUS
INTRAVENOUS | Status: AC
  Filled 2016-06-08: qty 40

## 2016-06-08 MED ORDER — DIPHENHYDRAMINE HCL 50 MG/ML IJ SOLN
25.0000 mg | Freq: Once | INTRAMUSCULAR | Status: AC
  Administered 2016-06-08: 25 mg via INTRAVENOUS
  Filled 2016-06-08: qty 1

## 2016-06-08 MED ORDER — ONDANSETRON HCL 4 MG/2ML IJ SOLN
INTRAMUSCULAR | Status: AC
  Filled 2016-06-08: qty 2

## 2016-06-08 MED ORDER — ACETAMINOPHEN 325 MG PO TABS
650.0000 mg | ORAL_TABLET | Freq: Once | ORAL | Status: AC
  Administered 2016-06-08: 650 mg via ORAL
  Filled 2016-06-08: qty 2

## 2016-06-08 SURGICAL SUPPLY — 14 items

## 2016-06-08 NOTE — Progress Notes (Signed)
PROGRESS NOTE        PATIENT DETAILS Name: Diane AlexandersJeane Roughton Age: 60 y.o. Sex: female Date of Birth: 1956-09-04 Admit Date: 06/06/2016 Admitting Physician Briscoe Deutscherimothy S Opyd, MD XBJ:[id number]:[id number], Ellison CarwinJOSALYN C, MD  Brief Narrative: Patient is a 60 y.o. female cirrhosis, Hepatitis C, ongoing alcohol abuse  presenting with lightheadedness and presyncope, found to have hemoglobin of 7.3-felt to be acute blood loss anemia in the setting of upper GI bleeding. Admitted for further evaluation and treatment  Subjective: No further melena since since admission, brown stools yesterday as well.  Assessment/Plan: Principal Problem: Acute upper gastrointestinal bleeding: Likely secondary to either variceal/portal gastropathy related bleeding or ulcer related bleeding (NSAID use prior to this admission). Continue PPI and octreotide infusion, GI consulted, was scheduled for a endoscopic evaluation this morning, however given worsening thrombocytopenia and ongoing coagulopathy, EGD has been canceled. Plans are to continue to monitor and pursue endoscopy if has recurrent bleeding.  Active Problems: Acute blood loss anemia: Secondary to above, transfused  3 unit of PRBC so far, hemoglobin slightly down today, if continues to drop will transfuse one additional unit. Repeat hemoglobin later today.   Acute renal failure: Likely prerenal azotemia- secondary to above, NSAID and diuretic use. Urine sodium 79, and argues against hepatorenal. Since she appeared volume overloaded with 2+ edema, she was started on intravenous Lasix. On admission she had soft blood pressure and was resuscitated with IV fluids and PRBC. Although improved, she still appears volume overloaded, continue Lasix. Renal ultrasound negative for hydronephrosis. Creatinine now downtrending.  Liver cirrhosis: Likely secondary to alcohol abuse and hepatitis C. MELD score 24.Per gastroenterology-not a liver transplant candidate due to  ongoing alcohol abuse. Remains on prophylactic IV Rocephin given GI bleeding.  Thrombocytopenia: Secondary to underlying liver cirrhosis with hypersplenism. Follow.  Coagulopathy: Secondary to liver cirrhosis, given vitamin K on admission, on 6/11 and again on 6/12. follow INR.  Alcohol abuse: Last drink 6/9, no signs of withdrawal. Continue Ativan per protocol. Counseled extensively.  Volume overload: Likely secondary to liver cirrhosis and hypoalbuminemia, check daily weights, improving with intravenous furosemide.   Chronic hepatitis C: Refer to infectious disease clinic as outpatient.  Prominent intra-hepatic biliary ducts: Seen on CT scan abdomen in June 2016-gastroenterology planning on MRCP at some point. Her LFTs are elevated-this is likely secondary to liver cirrhosis and alcohol abuse rather than this issue.  Morbid obesity  Deconditioning: Was extremely weak even prior to this admission-apparently was walking with the help of a walker, she was not very mobile because of weakness and anemia for 1 week prior to this admission. With her acute illness, she has become even more deconditioned, await PT evaluation.  DVT Prophylaxis: SCD's  Code Status: Full code  Family Communication: Spouse at bedside.  Disposition Plan: Remain inpatient--suspect she requires 2-3 more days of hospitalization.  Antimicrobial agents: IV Rocephin 6/10>>  Procedures: None  CONSULTS:  GI  Time spent: 25 minutes-Greater than 50% of this time was spent in counseling, explanation of diagnosis, planning of further management, and coordination of care.  MEDICATIONS: Anti-infectives    Start     Dose/Rate Route Frequency Ordered Stop   06/06/16 0415  cefTRIAXone (ROCEPHIN) 1 g in dextrose 5 % 50 mL IVPB     1 g 100 mL/hr over 30 Minutes Intravenous Daily 06/06/16 0352        Scheduled  Meds: . albumin human  25 g Intravenous Q6H  . cefTRIAXone (ROCEPHIN)  IV  1 g Intravenous Q0600  .  fluticasone  2 spray Each Nare Daily  . folic acid  1 mg Intravenous Daily  . furosemide  60 mg Intravenous Q12H  . lactulose  20 g Oral BID  . [START ON 06/09/2016] pantoprazole  40 mg Intravenous Q12H  . sodium chloride flush  3 mL Intravenous Q12H  . sodium chloride flush  3 mL Intravenous Q12H  . thiamine  100 mg Intravenous Daily   Continuous Infusions: . sodium chloride 20 mL/hr at 06/07/16 1737  . octreotide  (SANDOSTATIN)    IV infusion 50 mcg/hr (06/08/16 1007)  . pantoprozole (PROTONIX) infusion 8 mg/hr (06/08/16 1008)   PRN Meds:.sodium chloride, albuterol, ammonium lactate, LORazepam, ondansetron **OR** ondansetron (ZOFRAN) IV, oxyCODONE, sodium chloride flush   PHYSICAL EXAM: Vital signs: Filed Vitals:   06/07/16 2222 06/08/16 0024 06/08/16 0410 06/08/16 0621  BP: 128/71 127/50 113/50 142/79  Pulse: 82 81 83 84  Temp: 98.2 F (36.8 C) 99.2 F (37.3 C) 97.7 F (36.5 C) 98 F (36.7 C)  TempSrc: Oral Oral Oral Oral  Resp: 18 18 20 18   Height:      Weight:      SpO2: 99% 100% 95% 97%   Filed Weights   06/06/16 0500 06/07/16 0433  Weight: 172.8 kg (380 lb 15.3 oz) 176.5 kg (389 lb 1.8 oz)   Body mass index is 54.29 kg/(m^2).   Gen Exam: Awake and alert with clear speech. Not in any distress  Neck: Supple, No JVD.   Chest: B/L Clear.   CVS: S1 S2 Regular, no murmurs.  Abdomen: soft, BS +, non tender, non distended. Obese abdomen Extremities: ++ edema, lower extremities warm to touch. Neurologic: Non Focal.   Skin: No Rash or lesions   Wounds: N/A.   LABORATORY DATA: CBC:  Recent Labs Lab 06/06/16 0050 06/06/16 0930 06/06/16 1710 06/07/16 0334 06/08/16 0500  WBC 5.0 4.6 5.3 4.0 3.8*  NEUTROABS 2.1  --   --   --   --   HGB 7.3* 7.5* 8.5* 8.6* 7.9*  HCT 21.0* 21.7* 24.0* 24.9* 22.8*  MCV 108.2* 106.4* 103.0* 104.2* 103.6*  PLT 54* 44* 48* 50* 41*    Basic Metabolic Panel:  Recent Labs Lab 06/06/16 0050 06/06/16 0930 06/07/16 0334  06/08/16 0500  NA 137 137 137 139  K 3.6 4.0 3.9 3.6  CL 111 110 111 111  CO2 22 22 22 23   GLUCOSE 94 87 116* 84  BUN 19 21* 25* 25*  CREATININE 1.31* 1.59* 2.12* 1.76*  CALCIUM 7.6* 7.7* 7.9* 8.3*  MG  --   --   --  1.6*    GFR: Estimated Creatinine Clearance: 59.2 mL/min (by C-G formula based on Cr of 1.76).  Liver Function Tests:  Recent Labs Lab 06/06/16 0050 06/06/16 0930 06/07/16 0334 06/08/16 0500  AST 62* 64* 64* 56*  ALT 28 29 28 25   ALKPHOS 119 104 92 74  BILITOT 4.7* 5.0* 6.5* 6.1*  PROT 6.8 6.8 7.4 7.1  ALBUMIN 1.3* 1.6* 2.1* 2.7*   No results for input(s): LIPASE, AMYLASE in the last 168 hours.  Recent Labs Lab 06/06/16 0050  AMMONIA 86*    Coagulation Profile:  Recent Labs Lab 06/06/16 0050 06/07/16 0334 06/08/16 0500  INR 2.27* 2.14* 2.19*    Cardiac Enzymes:  Recent Labs Lab 06/06/16 0050  TROPONINI <0.03    BNP (last  3 results) No results for input(s): PROBNP in the last 8760 hours.  HbA1C: No results for input(s): HGBA1C in the last 72 hours.  CBG:  Recent Labs Lab 06/06/16 0751 06/08/16 0802  GLUCAP 95 79    Lipid Profile: No results for input(s): CHOL, HDL, LDLCALC, TRIG, CHOLHDL, LDLDIRECT in the last 72 hours.  Thyroid Function Tests: No results for input(s): TSH, T4TOTAL, FREET4, T3FREE, THYROIDAB in the last 72 hours.  Anemia Panel: No results for input(s): VITAMINB12, FOLATE, FERRITIN, TIBC, IRON, RETICCTPCT in the last 72 hours.  Urine analysis:    Component Value Date/Time   COLORURINE ORANGE* 06/06/2016 0145   APPEARANCEUR CLOUDY* 06/06/2016 0145   LABSPEC 1.023 06/06/2016 0145   PHURINE 5.5 06/06/2016 0145   GLUCOSEU NEGATIVE 06/06/2016 0145   HGBUR TRACE* 06/06/2016 0145   BILIRUBINUR LARGE* 06/06/2016 0145   KETONESUR NEGATIVE 06/06/2016 0145   PROTEINUR NEGATIVE 06/06/2016 0145   UROBILINOGEN 1.0 04/25/2015 1355   NITRITE POSITIVE* 06/06/2016 0145   LEUKOCYTESUR SMALL* 06/06/2016 0145     Sepsis Labs: Lactic Acid, Venous    Component Value Date/Time   LATICACIDVEN 2.16* 06/06/2016 0101    MICROBIOLOGY: Recent Results (from the past 240 hour(s))  MRSA PCR Screening     Status: None   Collection Time: 06/06/16  4:04 AM  Result Value Ref Range Status   MRSA by PCR NEGATIVE NEGATIVE Final    Comment:        The GeneXpert MRSA Assay (FDA approved for NASAL specimens only), is one component of a comprehensive MRSA colonization surveillance program. It is not intended to diagnose MRSA infection nor to guide or monitor treatment for MRSA infections.   Urine culture     Status: Abnormal   Collection Time: 06/06/16  5:20 PM  Result Value Ref Range Status   Specimen Description URINE, CLEAN CATCH  Final   Special Requests NONE  Final   Culture MULTIPLE SPECIES PRESENT, SUGGEST RECOLLECTION (A)  Final   Report Status 06/08/2016 FINAL  Final    RADIOLOGY STUDIES/RESULTS: US Renal  06/07/2016  CLINICAL DATA:  Acute renal failure. EXAM: RENAL / URINARY TRACT ULTRASOUND COMPLETE COMPARISON:  None. FINDINGS: Right Kidney: Length: 14.1 cm. Echogenicity within normal limits. No mass or hydronephrosis visualized. Left Kidney: Length: 12.8 cm. Minimal pelviectasis without overt hydronephrosis. Normal echogenicity. Bladder: Not visualized. Degraded exam secondary to patient body habitus. IMPRESSION: 1. Minimal left-sided pelviectasis, without overt hydronephrosis. 2. Lack of visualization of the urinary bladder. Decreased sensitivity and specificity exam due to technique related factors, as described above. Electronically Signed   By: Jeronimo Greaves M.D.   On: 06/07/2016 15:00     LOS: 2 days   Jeoffrey Massed, MD  [facility] Pager:[phone]  If 7PM-7AM, please contact night-coverage [email or web address] Password [facility] 06/08/2016, 10:23 AM

## 2016-06-08 NOTE — Progress Notes (Signed)
[facility] PROGRESS NOTE Subjective patient without further gross bleeding. Unfortunately her labs continue to decline. She has known cirrhosis demonstrated on CT scan and continues to drink. She also has active hepatitis C. Her total bilirubin remains 6.1 and INR is creeping up now 2.19 without any anticoagulation. Platelet count is drop to 41. The patient is receiving Protonix and octreotide.  Objective: Vital signs in last 24 hours: Temp:  [97.7 F (36.5 C)-99.2 F (37.3 C)] 98 F (36.7 C) (06/12 0621) Pulse Rate:  [70-92] 84 (06/12 0621) Resp:  [13-20] 18 (06/12 0621) BP: (113-161)/(50-79) 142/79 mmHg (06/12 0621) SpO2:  [95 %-100 %] 97 % (06/12 0621) Last BM Date: 06/07/16  Intake/Output from previous day: 06/11 0701 - 06/12 0700 In: 1117.9 [I.V.:617.9; IV Piggyback:500] Out: 5500 [Urine:5500] Intake/Output this shift:    PE: General-- morbidly obese African-American female Heart-- no gross murmurs or gallops Lungs-- diminished breath sounds Abdomen-- morbidly obese but nontender  Lab Results:  Recent Labs  06/06/16 0050 06/06/16 0930 06/06/16 1710 06/07/16 0334 06/08/16 0500  WBC 5.0 4.6 5.3 4.0 3.8*  HGB 7.3* 7.5* 8.5* 8.6* 7.9*  HCT 21.0* 21.7* 24.0* 24.9* 22.8*  PLT 54* 44* 48* 50* 41*   BMET  Recent Labs  06/06/16 0050 06/06/16 0930 06/07/16 0334 06/08/16 0500  NA 137 137 137 139  K 3.6 4.0 3.9 3.6  CL 111 110 111 111  CO2 22 22 22 23   CREATININE 1.31* 1.59* 2.12* 1.76*   LFT  Recent Labs  06/06/16 0930 06/07/16 0334 06/08/16 0500  PROT 6.8 7.4 7.1  AST 64* 64* 56*  ALT 29 28 25   ALKPHOS 104 92 74  BILITOT 5.0* 6.5* 6.1*   PT/INR  Recent Labs  06/06/16 0050 06/07/16 0334 06/08/16 0500  LABPROT 24.8* 23.7* 24.1*  INR 2.27* 2.14* 2.19*   PANCREAS No results for input(s): LIPASE in the last 72 hours.       Studies/Results: [place] Renal  06/07/2016  CLINICAL DATA:  Acute renal failure. EXAM: RENAL / URINARY TRACT  ULTRASOUND COMPLETE COMPARISON:  None. FINDINGS: Right Kidney: Length: 14.1 cm. Echogenicity within normal limits. No mass or hydronephrosis visualized. Left Kidney: Length: 12.8 cm. Minimal pelviectasis without overt hydronephrosis. Normal echogenicity. Bladder: Not visualized. Degraded exam secondary to patient body habitus. IMPRESSION: 1. Minimal left-sided pelviectasis, without overt hydronephrosis. 2. Lack of visualization of the urinary bladder. Decreased sensitivity and specificity exam due to technique related factors, as described above. Electronically Signed   By: Jeronimo GreavesKyle  Talbot M.D.   On: 06/07/2016 15:00    Medications: I have reviewed the patient's current medications.  Assessment/Plan: 1. Chronic G.I. bleed. Agree with Dr. Dulce Sellarutlaw this is unlikely variceal bleed. According to the patient's family she has been taking Aleve. She probably has NSAID as well as portal gastropathy. Have discussed with anesthesia due to her morbid obesity and marked thrombocytopenia and progressive liver failure she is at high risk for any type of complication. I told her today with her family in attendance at if she continues to drink she likely will not live much longer. We have canceled her Endo for today and will continue to follow her. Would continue several days of octreotide and switch over to oral PPI. We give vitamin K to see if her liver will respond with improvement in her INR. Should she began to actively bleed at that time we would have to go ahead with EGD and she may need preoperative intubation.   Tresia Revolorio JR,Raeanna Soberanes L 06/08/2016, 9:32 AM  This note was created using voice recognition software. Minor errors may Have occurred unintentionally.  Pager: [phone] If no answer or after hours call [phone]

## 2016-06-08 NOTE — Progress Notes (Signed)
Dear Doctor:  This patient has been identified as a candidate for PICC for the following reason (s): restarts due to phlebitis and infiltration in 24 hours If you agree, please write an order for the indicated device. For any questions contact the Vascular Access Team at [phone] if no answer, please leave a message.  Thank you for supporting the early vascular access assessment program. 

## 2016-06-09 LAB — PROTIME-INR
INR: 2.29 — AB (ref 0.00–1.49)
PROTHROMBIN TIME: 25 s — AB (ref 11.6–15.2)

## 2016-06-09 LAB — CBC
HCT: 23.4 % — ABNORMAL LOW (ref 36.0–46.0)
HEMOGLOBIN: 8.2 g/dL — AB (ref 12.0–15.0)
MCH: 36.1 pg — ABNORMAL HIGH (ref 26.0–34.0)
MCHC: 35 g/dL (ref 30.0–36.0)
MCV: 103.1 fL — ABNORMAL HIGH (ref 78.0–100.0)
Platelets: 35 10*3/uL — ABNORMAL LOW (ref 150–400)
RBC: 2.27 MIL/uL — AB (ref 3.87–5.11)
RDW: 23.3 % — ABNORMAL HIGH (ref 11.5–15.5)
WBC: 3.7 10*3/uL — AB (ref 4.0–10.5)

## 2016-06-09 LAB — RETICULOCYTES
RBC.: 2.3 MIL/uL — AB (ref 3.87–5.11)
RETIC COUNT ABSOLUTE: 87.4 10*3/uL (ref 19.0–186.0)
Retic Ct Pct: 3.8 % — ABNORMAL HIGH (ref 0.4–3.1)

## 2016-06-09 LAB — LACTATE DEHYDROGENASE: LDH: 188 U/L (ref 98–192)

## 2016-06-09 LAB — COMPREHENSIVE METABOLIC PANEL
ALBUMIN: 3 g/dL — AB (ref 3.5–5.0)
ALK PHOS: 66 U/L (ref 38–126)
ALT: 23 U/L (ref 14–54)
ANION GAP: 6 (ref 5–15)
AST: 53 U/L — AB (ref 15–41)
BUN: 23 mg/dL — ABNORMAL HIGH (ref 6–20)
CHLORIDE: 109 mmol/L (ref 101–111)
CO2: 25 mmol/L (ref 22–32)
Calcium: 8.6 mg/dL — ABNORMAL LOW (ref 8.9–10.3)
Creatinine, Ser: 1.55 mg/dL — ABNORMAL HIGH (ref 0.44–1.00)
GFR calc Af Amer: 40 mL/min — ABNORMAL LOW (ref >60)
GFR calc non Af Amer: 35 mL/min — ABNORMAL LOW (ref >60)
Glucose, Bld: 93 mg/dL (ref 65–99)
Potassium: 3.2 mmol/L — ABNORMAL LOW (ref 3.5–5.1)
SODIUM: 140 mmol/L (ref 135–145)
Total Bilirubin: 7.8 mg/dL — ABNORMAL HIGH (ref 0.3–1.2)
Total Protein: 7.3 g/dL (ref 6.5–8.1)

## 2016-06-09 MED ORDER — SPIRONOLACTONE 25 MG PO TABS
50.0000 mg | ORAL_TABLET | Freq: Every day | ORAL | Status: DC
  Administered 2016-06-09 – 2016-06-12 (×4): 50 mg via ORAL
  Filled 2016-06-09 (×4): qty 2

## 2016-06-09 MED ORDER — FUROSEMIDE 10 MG/ML IJ SOLN
40.0000 mg | Freq: Two times a day (BID) | INTRAMUSCULAR | Status: DC
  Administered 2016-06-09 – 2016-06-12 (×6): 40 mg via INTRAVENOUS
  Filled 2016-06-09 (×6): qty 4

## 2016-06-09 MED ORDER — POTASSIUM CHLORIDE CRYS ER 20 MEQ PO TBCR
40.0000 meq | EXTENDED_RELEASE_TABLET | Freq: Once | ORAL | Status: AC
  Administered 2016-06-09: 40 meq via ORAL
  Filled 2016-06-09: qty 2

## 2016-06-09 MED ORDER — VITAMIN K1 10 MG/ML IJ SOLN
10.0000 mg | Freq: Once | INTRAVENOUS | Status: AC
  Administered 2016-06-09: 10 mg via INTRAVENOUS
  Filled 2016-06-09 (×2): qty 1

## 2016-06-09 NOTE — Progress Notes (Addendum)
PROGRESS NOTE        PATIENT DETAILS Name: Diane Cook Age: 60 y.o. Sex: female Date of Birth: 01-30-1956 Admit Date: 06/06/2016 Admitting Physician Briscoe Deutscher, MD [id number], Ellison Carwin, MD  Brief Narrative: Patient is a 60 y.o. female cirrhosis, Hepatitis C, ongoing alcohol abuse  presenting with lightheadedness and presyncope, found to have hemoglobin of 7.3-felt to be acute blood loss anemia in the setting of upper GI bleeding. Admitted for further evaluation and treatment  Subjective: No further melena since since admission, brown stools yesterday as well. Her lower extremity edema has improved.  Assessment/Plan: Principal Problem: Acute upper gastrointestinal bleeding: Likely secondary to either variceal/portal gastropathy related bleeding or ulcer related bleeding (NSAID use prior to this admission). Continue PPI and octreotide infusion, GI consulted, was scheduled for a endoscopic evaluation on 6/12-but given worsening thrombocytopenia and coronary artery EGD has been canceled. Plans are to continue to monitor-and pursue EGD if overt bleeding. GI bleeding seems to have resolved as patient is having brown stools over the past few days without any melena or hematochezia-however her hemoglobin is not appropriately increasing after PRBC transfusion. I suspect she may be still slowly bleeding, hence I have spoken with gastroenterology to see if we need to go ahead and perform a endoscopic evaluation. Although I doubt hemolytic anemia-she does have a history of hepatitis C, will send out LDH, haptoglobin and reticulocyte count.  Active Problems: Acute blood loss anemia: Secondary to above, transfused 5 units unit of PRBC so far, last hemoglobin transfusion was on 6/12. Hemoglobin is not appropriately increasing after this many units of PRBC transfusion, as noted above, LDH, haptoglobin and reticulocyte count have been sent out. Case was discussed with Dr.  Arbutus Ped, who suggest that if the hemolytic panel was positive, to give a call back. See above regarding plans for endoscopic evaluation.   Acute renal failure: Likely prerenal azotemia- secondary to above, NSAID and diuretic use. Urine sodium 79, and argues against hepatorenal. Volume status much improved-however still has lower extremity edema-continue intravenous Lasix, will continue for 1 more day, before transitioning back to oral diuretic regimen.  Renal ultrasound negative for hydronephrosis. Creatinine now downtrending. Suspect that Foley catheter can be discontinued 6/14 if creatinine continues to downtrend, and if back to oral diuretic regimen   Liver cirrhosis: Likely secondary to alcohol abuse and hepatitis C. MELD score 24.Per gastroenterology-not a liver transplant candidate due to ongoing alcohol abuse. Remains on prophylactic IV Rocephin given GI bleeding.  Thrombocytopenia: Secondary to underlying liver cirrhosis with hypersplenism. Follow.  Coagulopathy: Secondary to liver cirrhosis, given multiple doses of vitamin K throughout this hospital stay, INR continues to be significantly elevated.  Alcohol abuse: Last drink 6/9, no signs of withdrawal. Continue Ativan per protocol. Counseled extensively.  Volume overload: Likely secondary to liver cirrhosis and hypoalbuminemia, check daily weights, improving with intravenous furosemide. Negative balance of 3.1 L so far, I suspect weights are unreliable at this time.   Chronic hepatitis C: Refer to infectious disease clinic as outpatient.  Prominent intra-hepatic biliary ducts: Seen on CT scan abdomen in June 2016-gastroenterology planning on MRCP at some point. Her LFTs are elevated-this is likely secondary to liver cirrhosis and alcohol abuse rather than this issue.  Jaundice/elevated liver enzymes: Suspect secondary to alcoholic liver cirrhosis.  Morbid obesity  Deconditioning: Was extremely weak even prior to this  admission-apparently  was walking with the help of a walker, she was not very mobile because of weakness and anemia for 1 week prior to this admission. With her acute illness, she has become even more deconditioned, PT evaluation completed, patient agreeable for SNF.Marland Kitchen.  DVT Prophylaxis: SCD's  Code Status: Full code  Family Communication: Spouse at bedside. Spoke with patient's daughter over the phone.  Disposition Plan: Remain inpatient--suspect she requires 1-2 more days of hospitalization prior to discharge to SNF.  Antimicrobial agents: IV Rocephin 6/10>>  Procedures: None  CONSULTS:  GI  Time spent: 25 minutes-Greater than 50% of this time was spent in counseling, explanation of diagnosis, planning of further management, and coordination of care.  MEDICATIONS: Anti-infectives    Start     Dose/Rate Route Frequency Ordered Stop   06/06/16 0415  cefTRIAXone (ROCEPHIN) 1 g in dextrose 5 % 50 mL IVPB     1 g 100 mL/hr over 30 Minutes Intravenous Daily 06/06/16 0352        Scheduled Meds: . albumin human  25 g Intravenous Q6H  . cefTRIAXone (ROCEPHIN)  IV  1 g Intravenous Q0600  . fluticasone  2 spray Each Nare Daily  . folic acid  1 mg Intravenous Daily  . furosemide  60 mg Intravenous Q12H  . lactulose  20 g Oral BID  . pantoprazole  40 mg Intravenous Q12H  . sodium chloride flush  3 mL Intravenous Q12H  . sodium chloride flush  3 mL Intravenous Q12H  . thiamine  100 mg Intravenous Daily   Continuous Infusions: . sodium chloride 20 mL/hr at 06/07/16 1737  . octreotide  (SANDOSTATIN)    IV infusion 50 mcg/hr (06/09/16 0704)   PRN Meds:.sodium chloride, albuterol, ammonium lactate, LORazepam, ondansetron **OR** ondansetron (ZOFRAN) IV, oxyCODONE, sodium chloride flush   PHYSICAL EXAM: Vital signs: Filed Vitals:   06/09/16 0500 06/09/16 0600 06/09/16 0702 06/09/16 0900  BP: 141/64  147/85 163/57  Pulse: 82  76 79  Temp: 98.4 F (36.9 C)  98.6 F (37 C) 97.9  F (36.6 C)  TempSrc: Oral  Oral Oral  Resp: 20  20   Height:      Weight:  160.12 kg (353 lb)    SpO2: 97%  98%    Filed Weights   06/06/16 0500 06/07/16 0433 06/09/16 0600  Weight: 172.8 kg (380 lb 15.3 oz) 176.5 kg (389 lb 1.8 oz) 160.12 kg (353 lb)   Body mass index is 49.26 kg/(m^2).   Gen Exam: Awake and alert with clear speech. Not in any distress  Neck: Supple, No JVD.   Chest: B/L Clear.   CVS: S1 S2 Regular, no murmurs.  Abdomen: soft, BS +, non tender, non distended. Obese abdomen Extremities: + edema, lower extremities warm to touch. Neurologic: Non Focal.   Skin: No Rash or lesions   Wounds: N/A.   LABORATORY DATA: CBC:  Recent Labs Lab 06/06/16 0050  06/06/16 1710 06/07/16 0334 06/08/16 0500 06/08/16 1652 06/09/16 0511  WBC 5.0  < > 5.3 4.0 3.8* 3.6* 3.7*  NEUTROABS 2.1  --   --   --   --   --   --   HGB 7.3*  < > 8.5* 8.6* 7.9* 7.9* 8.2*  HCT 21.0*  < > 24.0* 24.9* 22.8* 22.7* 23.4*  MCV 108.2*  < > 103.0* 104.2* 103.6* 104.6* 103.1*  PLT 54*  < > 48* 50* 41* 43* 35*  < > = values in this interval not displayed.  Basic  Metabolic Panel:  Recent Labs Lab 06/06/16 0050 06/06/16 0930 06/07/16 0334 06/08/16 0500 06/09/16 0511  NA 137 137 137 139 140  K 3.6 4.0 3.9 3.6 3.2*  CL 111 110 111 111 109  CO2 22 22 22 23 25   GLUCOSE 94 87 116* 84 93  BUN 19 21* 25* 25* 23*  CREATININE 1.31* 1.59* 2.12* 1.76* 1.55*  CALCIUM 7.6* 7.7* 7.9* 8.3* 8.6*  MG  --   --   --  1.6*  --     GFR: Estimated Creatinine Clearance: 63.3 mL/min (by C-G formula based on Cr of 1.55).  Liver Function Tests:  Recent Labs Lab 06/06/16 0050 06/06/16 0930 06/07/16 0334 06/08/16 0500 06/09/16 0511  AST 62* 64* 64* 56* 53*  ALT 28 29 28 25 23   ALKPHOS 119 104 92 74 66  BILITOT 4.7* 5.0* 6.5* 6.1* 7.8*  PROT 6.8 6.8 7.4 7.1 7.3  ALBUMIN 1.3* 1.6* 2.1* 2.7* 3.0*   No results for input(s): LIPASE, AMYLASE in the last 168 hours.  Recent Labs Lab 06/06/16 0050    AMMONIA 86*    Coagulation Profile:  Recent Labs Lab 06/06/16 0050 06/07/16 0334 06/08/16 0500 06/09/16 0511  INR 2.27* 2.14* 2.19* 2.29*    Cardiac Enzymes:  Recent Labs Lab 06/06/16 0050  TROPONINI <0.03    BNP (last 3 results) No results for input(s): PROBNP in the last 8760 hours.  HbA1C: No results for input(s): HGBA1C in the last 72 hours.  CBG:  Recent Labs Lab 06/06/16 0751 06/08/16 0802  GLUCAP 95 79    Lipid Profile: No results for input(s): CHOL, HDL, LDLCALC, TRIG, CHOLHDL, LDLDIRECT in the last 72 hours.  Thyroid Function Tests: No results for input(s): TSH, T4TOTAL, FREET4, T3FREE, THYROIDAB in the last 72 hours.  Anemia Panel: No results for input(s): VITAMINB12, FOLATE, FERRITIN, TIBC, IRON, RETICCTPCT in the last 72 hours.  Urine analysis:    Component Value Date/Time   COLORURINE ORANGE* 06/06/2016 0145   APPEARANCEUR CLOUDY* 06/06/2016 0145   LABSPEC 1.023 06/06/2016 0145   PHURINE 5.5 06/06/2016 0145   GLUCOSEU NEGATIVE 06/06/2016 0145   HGBUR TRACE* 06/06/2016 0145   BILIRUBINUR LARGE* 06/06/2016 0145   KETONESUR NEGATIVE 06/06/2016 0145   PROTEINUR NEGATIVE 06/06/2016 0145   UROBILINOGEN 1.0 04/25/2015 1355   NITRITE POSITIVE* 06/06/2016 0145   LEUKOCYTESUR SMALL* 06/06/2016 0145    Sepsis Labs: Lactic Acid, Venous    Component Value Date/Time   LATICACIDVEN 2.16* 06/06/2016 0101    MICROBIOLOGY: Recent Results (from the past 240 hour(s))  MRSA PCR Screening     Status: None   Collection Time: 06/06/16  4:04 AM  Result Value Ref Range Status   MRSA by PCR NEGATIVE NEGATIVE Final    Comment:        The GeneXpert MRSA Assay (FDA approved for NASAL specimens only), is one component of a comprehensive MRSA colonization surveillance program. It is not intended to diagnose MRSA infection nor to guide or monitor treatment for MRSA infections.   Urine culture     Status: Abnormal   Collection Time: 06/06/16  5:20  PM  Result Value Ref Range Status   Specimen Description URINE, CLEAN CATCH  Final   Special Requests NONE  Final   Culture MULTIPLE SPECIES PRESENT, SUGGEST RECOLLECTION (A)  Final   Report Status 06/08/2016 FINAL  Final    RADIOLOGY STUDIES/RESULTS: US Renal  06/07/2016  CLINICAL DATA:  Acute renal failure. EXAM: RENAL / URINARY TRACT ULTRASOUND COMPLETE COMPARISON:  None. FINDINGS: Right Kidney: Length: 14.1 cm. Echogenicity within normal limits. No mass or hydronephrosis visualized. Left Kidney: Length: 12.8 cm. Minimal pelviectasis without overt hydronephrosis. Normal echogenicity. Bladder: Not visualized. Degraded exam secondary to patient body habitus. IMPRESSION: 1. Minimal left-sided pelviectasis, without overt hydronephrosis. 2. Lack of visualization of the urinary bladder. Decreased sensitivity and specificity exam due to technique related factors, as described above. Electronically Signed   By: Jeronimo Greaves M.D.   On: 06/07/2016 15:00     LOS: 3 days   Jeoffrey Massed, MD  [facility] Pager:[phone]  If 7PM-7AM, please contact night-coverage [email or web address] Password [facility] 06/09/2016, 12:37 PM

## 2016-06-09 NOTE — Progress Notes (Signed)
[facility] PROGRESS NOTE Subjective patient much more alert today. She is not had a bowel movement all day and apparently had a brown bowel movement yesterday. Her hemoglobin is not risen very much that she is not clinically had obvious bleeding. Unfortunately, hemoglobin only 8.2 platelet count is still low between 40 and 50 due to cirrhosis and splenomegaly. Total bilirubin creeping up.  Objective: Vital signs in last 24 hours: Temp:  [97.6 F (36.4 C)-99 F (37.2 C)] 98.4 F (36.9 C) (06/13 1400) Pulse Rate:  [68-82] 68 (06/13 1400) Resp:  [16-20] 20 (06/13 0702) BP: (141-163)/(57-85) 156/84 mmHg (06/13 1400) SpO2:  [95 %-98 %] 98 % (06/13 0702) Weight:  [160.12 kg (353 lb)] 160.12 kg (353 lb) (06/13 0600) Last BM Date: 06/08/16  Intake/Output from previous day: 06/12 0701 - 06/13 0700 In: 2987.4 [P.O.:240; I.V.:1905.4; Blood:392; IV Piggyback:450] Out: 1610 [[id number] [Urine:4875; Stool:3] Intake/Output this shift: Total I/O In: 643.3 [P.O.:240; I.V.:403.3] Out: 3000 [Urine:3000]  PE: General-- morbidly obese female, alert and oriented  Abdomen-- obese but nontender no gross societies.  Lab Results:  Recent Labs  06/06/16 1710 06/07/16 0334 06/08/16 0500 06/08/16 1652 06/09/16 0511  WBC 5.3 4.0 3.8* 3.6* 3.7*  HGB 8.5* 8.6* 7.9* 7.9* 8.2*  HCT 24.0* 24.9* 22.8* 22.7* 23.4*  PLT 48* 50* 41* 43* 35*   BMET  Recent Labs  06/07/16 0334 06/08/16 0500 06/09/16 0511  NA 137 139 140  K 3.9 3.6 3.2*  CL 111 111 109  CO2 22 23 25   CREATININE 2.12* 1.76* 1.55*   LFT  Recent Labs  06/07/16 0334 06/08/16 0500 06/09/16 0511  PROT 7.4 7.1 7.3  AST 64* 56* 53*  ALT 28 25 23   ALKPHOS 92 74 66  BILITOT 6.5* 6.1* 7.8*   PT/INR  Recent Labs  06/07/16 0334 06/08/16 0500 06/09/16 0511  LABPROT 23.7* 24.1* 25.0*  INR 2.14* 2.19* 2.29*   PANCREAS No results for input(s): LIPASE in the last 72 hours.       Studies/Results: [place] Renal  06/07/2016   CLINICAL DATA:  Acute renal failure. EXAM: RENAL / URINARY TRACT ULTRASOUND COMPLETE COMPARISON:  None. FINDINGS: Right Kidney: Length: 14.1 cm. Echogenicity within normal limits. No mass or hydronephrosis visualized. Left Kidney: Length: 12.8 cm. Minimal pelviectasis without overt hydronephrosis. Normal echogenicity. Bladder: Not visualized. Degraded exam secondary to patient body habitus. IMPRESSION: 1. Minimal left-sided pelviectasis, without overt hydronephrosis. 2. Lack of visualization of the urinary bladder. Decreased sensitivity and specificity exam due to technique related factors, as described above. Electronically Signed   By: Jeronimo GreavesKyle  Talbot M.D.   On: 06/07/2016 15:00    Medications: I have reviewed the patient's current medications.  Assessment/Plan: 1. G.I. bleeding. This is low-grade and is not behaving as if this were variceal bleeding she could have an ulcer portal gastropathy etc. Unfortunately she's thrombocytopenic in her INR is slightly elevated due to her liver disease. 2. Cirrhosis. Due to a combination of hepatitis C, continued severe alcohol abuse and morbid obesity  We will go ahead and attempt again tomorrow diagnostic EGD. We will use pediatric endoscope if necessary. Will plan on using propofol. Hopefully this will give us some idea if she has varices portal gastropathy etc. I feel that with her ongoing liver disease low platelets etc. that colonoscopy would be quite risky.   Tylynn Braniff JR,Mauri Tolen L 06/09/2016, 2:42 PM  This note was created using voice recognition software. Minor errors may Have occurred unintentionally.  Pager: [phone] If no answer or after hours  call [phone]

## 2016-06-10 ENCOUNTER — Encounter (HOSPITAL_COMMUNITY): Payer: Self-pay | Admitting: Certified Registered Nurse Anesthetist

## 2016-06-10 ENCOUNTER — Inpatient Hospital Stay (HOSPITAL_COMMUNITY): Payer: Medicaid Other | Admitting: Anesthesiology

## 2016-06-10 ENCOUNTER — Encounter (HOSPITAL_COMMUNITY)
Admission: EM | Disposition: A | Discharge status: Skilled Nursing Facility | Payer: Self-pay | Source: Home / Self Care | Attending: Internal Medicine

## 2016-06-10 DIAGNOSIS — D649 Anemia, unspecified: Secondary | ICD-10-CM

## 2016-06-10 DIAGNOSIS — K922 Gastrointestinal hemorrhage, unspecified: Secondary | ICD-10-CM

## 2016-06-10 DIAGNOSIS — F1010 Alcohol abuse, uncomplicated: Secondary | ICD-10-CM

## 2016-06-10 DIAGNOSIS — N179 Acute kidney failure, unspecified: Secondary | ICD-10-CM

## 2016-06-10 HISTORY — PX: ESOPHAGOGASTRODUODENOSCOPY (EGD) WITH PROPOFOL: SHX5813

## 2016-06-10 LAB — TYPE AND SCREEN
ABO/RH(D): O POS
Antibody Screen: NEGATIVE
UNIT DIVISION: 0
UNIT DIVISION: 0
Unit division: 0
Unit division: 0
Unit division: 0

## 2016-06-10 LAB — HEPATIC FUNCTION PANEL
ALBUMIN: 3.6 g/dL (ref 3.5–5.0)
ALK PHOS: 61 U/L (ref 38–126)
ALT: 23 U/L (ref 14–54)
AST: 49 U/L — AB (ref 15–41)
BILIRUBIN TOTAL: 9.4 mg/dL — AB (ref 0.3–1.2)
Bilirubin, Direct: 3.5 mg/dL — ABNORMAL HIGH (ref 0.1–0.5)
Indirect Bilirubin: 5.9 mg/dL — ABNORMAL HIGH (ref 0.3–0.9)
TOTAL PROTEIN: 7.6 g/dL (ref 6.5–8.1)

## 2016-06-10 LAB — BASIC METABOLIC PANEL
ANION GAP: 7 (ref 5–15)
BUN: 18 mg/dL (ref 6–20)
CO2: 29 mmol/L (ref 22–32)
Calcium: 9.1 mg/dL (ref 8.9–10.3)
Chloride: 106 mmol/L (ref 101–111)
Creatinine, Ser: 1.21 mg/dL — ABNORMAL HIGH (ref 0.44–1.00)
GFR calc non Af Amer: 47 mL/min — ABNORMAL LOW (ref >60)
GFR, EST AFRICAN AMERICAN: 54 mL/min — AB (ref >60)
GLUCOSE: 100 mg/dL — AB (ref 65–99)
POTASSIUM: 3.2 mmol/L — AB (ref 3.5–5.1)
Sodium: 142 mmol/L (ref 135–145)

## 2016-06-10 LAB — HAPTOGLOBIN

## 2016-06-10 LAB — PREPARE RBC (CROSSMATCH)

## 2016-06-10 LAB — CBC
HEMATOCRIT: 23.2 % — AB (ref 36.0–46.0)
HEMOGLOBIN: 8 g/dL — AB (ref 12.0–15.0)
MCH: 35.9 pg — ABNORMAL HIGH (ref 26.0–34.0)
MCHC: 34.5 g/dL (ref 30.0–36.0)
MCV: 104 fL — ABNORMAL HIGH (ref 78.0–100.0)
Platelets: 37 10*3/uL — ABNORMAL LOW (ref 150–400)
RBC: 2.23 MIL/uL — ABNORMAL LOW (ref 3.87–5.11)
RDW: 22.9 % — AB (ref 11.5–15.5)
WBC: 3.9 10*3/uL — AB (ref 4.0–10.5)

## 2016-06-10 LAB — GLUCOSE, CAPILLARY: Glucose-Capillary: 99 mg/dL (ref 65–99)

## 2016-06-10 LAB — PROTIME-INR
INR: 2.51 — AB (ref 0.00–1.49)
Prothrombin Time: 25.9 seconds — ABNORMAL HIGH (ref 11.6–15.2)

## 2016-06-10 SURGERY — ESOPHAGOGASTRODUODENOSCOPY (EGD) WITH PROPOFOL
Anesthesia: Monitor Anesthesia Care

## 2016-06-10 SURGERY — EGD (ESOPHAGOGASTRODUODENOSCOPY)
Anesthesia: Monitor Anesthesia Care

## 2016-06-10 MED ORDER — PROPOFOL 10 MG/ML IV BOLUS
INTRAVENOUS | Status: AC
  Filled 2016-06-10: qty 40

## 2016-06-10 MED ORDER — SODIUM CHLORIDE 0.9 % IV SOLN: Freq: Once | INTRAVENOUS | Status: DC

## 2016-06-10 MED ORDER — POTASSIUM CHLORIDE CRYS ER 20 MEQ PO TBCR
40.0000 meq | EXTENDED_RELEASE_TABLET | Freq: Once | ORAL | Status: AC
  Administered 2016-06-10: 40 meq via ORAL
  Filled 2016-06-10: qty 2

## 2016-06-10 MED ORDER — DEXTROSE 5 % IV SOLN
2.0000 g | Freq: Every day | INTRAVENOUS | Status: DC
  Administered 2016-06-11: 2 g via INTRAVENOUS
  Filled 2016-06-10: qty 2

## 2016-06-10 MED ORDER — LIDOCAINE HCL (CARDIAC) 20 MG/ML IV SOLN
INTRAVENOUS | Status: AC
  Filled 2016-06-10: qty 5

## 2016-06-10 MED ORDER — SODIUM CHLORIDE 0.9 % IV SOLN
INTRAVENOUS | Status: DC
  Administered 2016-06-10: 10:00:00 via INTRAVENOUS

## 2016-06-10 MED ORDER — LACTATED RINGERS IV SOLN
INTRAVENOUS | Status: DC | PRN
  Administered 2016-06-10: 14:00:00 via INTRAVENOUS

## 2016-06-10 MED ORDER — LIDOCAINE HCL (CARDIAC) 20 MG/ML IV SOLN
INTRAVENOUS | Status: DC | PRN
  Administered 2016-06-10: 100 mg via INTRAVENOUS

## 2016-06-10 MED ORDER — PROPOFOL 10 MG/ML IV BOLUS
INTRAVENOUS | Status: AC
  Filled 2016-06-10: qty 20

## 2016-06-10 MED ORDER — PROPOFOL 10 MG/ML IV BOLUS
INTRAVENOUS | Status: DC | PRN
  Administered 2016-06-10: 50 mg via INTRAVENOUS

## 2016-06-10 MED ORDER — PROPOFOL 500 MG/50ML IV EMUL
INTRAVENOUS | Status: DC | PRN
  Administered 2016-06-10: 100 ug/kg/min via INTRAVENOUS

## 2016-06-10 SURGICAL SUPPLY — 14 items

## 2016-06-10 NOTE — Progress Notes (Signed)
CSW reviewed PT evaluation recommending SNF vs Home Health. CSW met with patient & significant other, Diane Cook at bedside - patient is not agreeable with SNF at this time, plans to return home. RNCM, Juliann Pulse made aware.   No further CSW needs identified - CSW signing off.   Diane Cook, [facility] Clinical Social Worker cell #: [phone]

## 2016-06-10 NOTE — Care Management Note (Signed)
Case Management Note  Patient Details  Name: Emmit AlexandersJeane Resetar MRN: [id number] Date of Birth: July 21, 1956  Subjective/Objective:  PT recc SNf-patient declines SNF, Will provide HHC agency list-await choice. Recc HHRN/social worker, [id number] order, rw,3n1.                  Action/Plan:d/c plan home w/HHC/DME.   Expected Discharge Date:                  Expected Discharge Plan:  Home w Home Health Services  In-House Referral:     Discharge planning Services  CM Consult  Post Acute Care Choice:    Choice offered to:  Patient  DME Arranged:    DME Agency:     HH Arranged:    HH Agency:     Status of Service:  In process, will continue to follow  Medicare Important Message Given:    Date Medicare IM Given:    Medicare IM give by:    Date Additional Medicare IM Given:    Additional Medicare Important Message give by:     If discussed at Long Length of Stay Meetings, dates discussed:    Additional Comments:  Lanier ClamMahabir, Elanna Bert, RN 06/10/2016, 11:09 AM

## 2016-06-10 NOTE — Transfer of Care (Signed)
Immediate Anesthesia Transfer of Care Note  Patient: Diane Cook  Procedure(s) Performed: Procedure(s): ESOPHAGOGASTRODUODENOSCOPY (EGD) WITH PROPOFOL (N/A)  Patient Location: PACU  Anesthesia Type:MAC  Level of Consciousness: Patient easily awoken, sedated, comfortable, cooperative, following commands, responds to stimulation.   Airway & Oxygen Therapy: Patient spontaneously breathing, ventilating well, oxygen via simple oxygen mask.  Post-op Assessment: Report given to PACU RN, vital signs reviewed and stable, moving all extremities.   Post vital signs: Reviewed and stable.  Complications: No apparent anesthesia complications

## 2016-06-10 NOTE — H&P (View-Only) (Signed)
[facility] PROGRESS NOTE Subjective patient much more alert today. She is not had a bowel movement all day and apparently had a brown bowel movement yesterday. Her hemoglobin is not risen very much that she is not clinically had obvious bleeding. Unfortunately, hemoglobin only 8.2 platelet count is still low between 40 and 50 due to cirrhosis and splenomegaly. Total bilirubin creeping up.  Objective: Vital signs in last 24 hours: Temp:  [97.6 F (36.4 C)-99 F (37.2 C)] 98.4 F (36.9 C) (06/13 1400) Pulse Rate:  [68-82] 68 (06/13 1400) Resp:  [16-20] 20 (06/13 0702) BP: (141-163)/(57-85) 156/84 mmHg (06/13 1400) SpO2:  [95 %-98 %] 98 % (06/13 0702) Weight:  [160.12 kg (353 lb)] 160.12 kg (353 lb) (06/13 0600) Last BM Date: 06/08/16  Intake/Output from previous day: 06/12 0701 - 06/13 0700 In: 2987.4 [P.O.:240; I.V.:1905.4; Blood:392; IV Piggyback:450] Out: 4878 [Urine:4875; Stool:3] Intake/Output this shift: Total I/O In: 643.3 [P.O.:240; I.V.:403.3] Out: 3000 [Urine:3000]  PE: General-- morbidly obese female, alert and oriented  Abdomen-- obese but nontender no gross societies.  Lab Results:  Recent Labs  06/06/16 1710 06/07/16 0334 06/08/16 0500 06/08/16 1652 06/09/16 0511  WBC 5.3 4.0 3.8* 3.6* 3.7*  HGB 8.5* 8.6* 7.9* 7.9* 8.2*  HCT 24.0* 24.9* 22.8* 22.7* 23.4*  PLT 48* 50* 41* 43* 35*   BMET  Recent Labs  06/07/16 0334 06/08/16 0500 06/09/16 0511  NA 137 139 140  K 3.9 3.6 3.2*  CL 111 111 109  CO2 22 23 25  CREATININE 2.12* 1.76* 1.55*   LFT  Recent Labs  06/07/16 0334 06/08/16 0500 06/09/16 0511  PROT 7.4 7.1 7.3  AST 64* 56* 53*  ALT 28 25 23  ALKPHOS 92 74 66  BILITOT 6.5* 6.1* 7.8*   PT/INR  Recent Labs  06/07/16 0334 06/08/16 0500 06/09/16 0511  LABPROT 23.7* 24.1* 25.0*  INR 2.14* 2.19* 2.29*   PANCREAS No results for input(s): LIPASE in the last 72 hours.       Studies/Results: Us Renal  06/07/2016   CLINICAL DATA:  Acute renal failure. EXAM: RENAL / URINARY TRACT ULTRASOUND COMPLETE COMPARISON:  None. FINDINGS: Right Kidney: Length: 14.1 cm. Echogenicity within normal limits. No mass or hydronephrosis visualized. Left Kidney: Length: 12.8 cm. Minimal pelviectasis without overt hydronephrosis. Normal echogenicity. Bladder: Not visualized. Degraded exam secondary to patient body habitus. IMPRESSION: 1. Minimal left-sided pelviectasis, without overt hydronephrosis. 2. Lack of visualization of the urinary bladder. Decreased sensitivity and specificity exam due to technique related factors, as described above. Electronically Signed   By: Kyle  Talbot M.D.   On: 06/07/2016 15:00    Medications: I have reviewed the patient's current medications.  Assessment/Plan: 1. G.I. bleeding. This is low-grade and is not behaving as if this were variceal bleeding she could have an ulcer portal gastropathy etc. Unfortunately she's thrombocytopenic in her INR is slightly elevated due to her liver disease. 2. Cirrhosis. Due to a combination of hepatitis C, continued severe alcohol abuse and morbid obesity  We will go ahead and attempt again tomorrow diagnostic EGD. We will use pediatric endoscope if necessary. Will plan on using propofol. Hopefully this will give us some idea if she has varices portal gastropathy etc. I feel that with her ongoing liver disease low platelets etc. that colonoscopy would be quite risky.   Dominika Losey JR,Janique Hoefer L 06/09/2016, 2:42 PM  This note was created using voice recognition software. Minor errors may Have occurred unintentionally.  Pager: [phone] If no answer or after hours   call [phone]

## 2016-06-10 NOTE — Anesthesia Postprocedure Evaluation (Signed)
Anesthesia Post Note  Patient: Emmit AlexandersJeane Wiens  Procedure(s) Performed: Procedure(s) (LRB): ESOPHAGOGASTRODUODENOSCOPY (EGD) WITH PROPOFOL (N/A)  Patient location during evaluation: Endoscopy Anesthesia Type: MAC Level of consciousness: awake and alert Pain management: pain level controlled Vital Signs Assessment: post-procedure vital signs reviewed and stable Respiratory status: spontaneous breathing, nonlabored ventilation, respiratory function stable and patient connected to nasal cannula oxygen Cardiovascular status: stable and blood pressure returned to baseline Anesthetic complications: no    Last Vitals:  Filed Vitals:   06/10/16 1425 06/10/16 1432  BP: 170/77 172/76  Pulse: 67 71  Temp:    Resp: 21 19    Last Pain:  Filed Vitals:   06/10/16 1435  PainSc: 0-No pain                 Field Staniszewski,JAMES TERRILL

## 2016-06-10 NOTE — Interval H&P Note (Signed)
History and Physical Interval Note:  06/10/2016 12:22 PM  Diane Cook  has presented today for surgery, with the diagnosis of gib  The various methods of treatment have been discussed with the patient and family. After consideration of risks, benefits and other options for treatment, the patient has consented to  Procedure(s): ESOPHAGOGASTRODUODENOSCOPY (EGD) WITH PROPOFOL (N/A) as a surgical intervention .  The patient's history has been reviewed, patient examined, no change in status, stable for surgery.  I have reviewed the patient's chart and labs.  Questions were answered to the patient's satisfaction.     Klaudia Beirne JR,Fender Herder L

## 2016-06-10 NOTE — Progress Notes (Signed)
PROGRESS NOTE        PATIENT DETAILS Name: Diane Cook Age: 60 y.o. Sex: female Date of Birth: 1956-09-09 Admit Date: 06/06/2016 Admitting Physician Briscoe Deutscher, MD WUJ:[id number], Ellison Carwin, MD  Brief Narrative: Patient is a 60 y.o. female cirrhosis, Hepatitis C, ongoing alcohol abuse  presenting with lightheadedness and presyncope, found to have hemoglobin of 7.3-felt to be acute blood loss anemia in the setting of upper GI bleeding. Admitted for further evaluation and treatment  Subjective: No further melena since since admission, brown stools yesterday as well. Her lower extremity edema has improved.  Assessment/Plan: Principal Problem: Acute upper gastrointestinal bleeding:  -Likely secondary to either variceal/portal gastropathy related bleeding or ulcer related bleeding (NSAID use prior to this admission). Continue PPI and octreotide infusion,  -GI consulted, was scheduled for a endoscopic evaluation on 6/12-but given worsening thrombocytopenia and coronary artery EGD postponed.  -During this hospitalization she has received a total of 5 units of packed red blood cells however hemoglobin not responding appropriately, suspect ongoing slow bleed. -GI following, there are plans for her to undergo upper endoscopy this afternoon.  Active Problems: Acute blood loss anemia:  -Like he secondary to upper GI bleed. She has been transfused 5 units unit of PRBC so far, last hemoglobin transfusion was on 6/12. Hemoglobin is not appropriately increasing after this many units of PRBC transfusion, as noted above -LDH within normal limits at 188 -A.m. lab work showing hemoglobin of 8.0. -Plan for further evaluation with EGD today.  Acute renal failure: Likely prerenal azotemia- secondary to above, NSAID and diuretic use. Urine sodium 79, and argues against hepatorenal. Volume status much improved-however still has lower extremity edema-continue intravenous Lasix, will  continue for 1 more day, before transitioning back to oral diuretic regimen.  Renal ultrasound negative for hydronephrosis.  -On 06/10/2016 creatinine improving, trending down to 1.2 from 5.5 previously.  Liver cirrhosis: Likely secondary to alcohol abuse and hepatitis C. MELD score 24.Per gastroenterology-not a liver transplant candidate due to ongoing alcohol abuse. Remains on prophylactic IV Rocephin given GI bleeding.  Thrombocytopenia: Secondary to underlying liver cirrhosis with hypersplenism. I'm showing platelet count of 37,000  Coagulopathy: Secondary to liver cirrhosis, given multiple doses of vitamin K throughout this hospital stay, INR continues to be significantly elevated.  Alcohol abuse: Last drink 6/9, no signs of withdrawal. Continue Ativan per protocol. Counseled extensively.  Volume overload: Likely secondary to liver cirrhosis and hypoalbuminemia, check daily weights, improving with intravenous furosemide. Negative balance of 3.1 L so far, I suspect weights are unreliable at this time.   Chronic hepatitis C: Refer to infectious disease clinic as outpatient.  Prominent intra-hepatic biliary ducts: Seen on CT scan abdomen in June 2016-gastroenterology planning on MRCP at some point. Her LFTs are elevated-this is likely secondary to liver cirrhosis and alcohol abuse rather than this issue.  Jaundice/elevated liver enzymes: Suspect secondary to alcoholic liver cirrhosis.  Morbid obesity  Deconditioning: Was extremely weak even prior to this admission-apparently was walking with the help of a walker, she was not very mobile because of weakness and anemia for 1 week prior to this admission. With her acute illness, she has become even more deconditioned, PT evaluation completed, patient agreeable for SNF.Marland Kitchen  DVT Prophylaxis: SCD's  Code Status: Full code  Family Communication: Spouse at bedside. Spoke with patient's daughter over the phone.  Disposition Plan:  Remain  inpatient--suspect she requires 1-2 more days of hospitalization prior to discharge to SNF.  Antimicrobial agents: IV Rocephin 6/10>>  Procedures: None  CONSULTS:  GI  Time spent: 25 minutes-Greater than 50% of this time was spent in counseling, explanation of diagnosis, planning of further management, and coordination of care.  MEDICATIONS: Anti-infectives    Start     Dose/Rate Route Frequency Ordered Stop   06/11/16 0600  cefTRIAXone (ROCEPHIN) 2 g in dextrose 5 % 50 mL IVPB     2 g 100 mL/hr over 30 Minutes Intravenous Daily 06/10/16 0756     06/06/16 0415  cefTRIAXone (ROCEPHIN) 1 g in dextrose 5 % 50 mL IVPB  Status:  Discontinued     1 g 100 mL/hr over 30 Minutes Intravenous Daily 06/06/16 0352 06/10/16 0756      Scheduled Meds: . [START ON 06/11/2016] cefTRIAXone (ROCEPHIN)  IV  2 g Intravenous Q0600  . fluticasone  2 spray Each Nare Daily  . folic acid  1 mg Intravenous Daily  . furosemide  40 mg Intravenous Q12H  . lactulose  20 g Oral BID  . pantoprazole  40 mg Intravenous Q12H  . potassium chloride  40 mEq Oral Once  . sodium chloride flush  3 mL Intravenous Q12H  . sodium chloride flush  3 mL Intravenous Q12H  . spironolactone  50 mg Oral Daily  . thiamine  100 mg Intravenous Daily   Continuous Infusions: . sodium chloride 20 mL/hr at 06/07/16 1737  . sodium chloride 20 mL/hr at 06/10/16 0934  . octreotide  (SANDOSTATIN)    IV infusion 50 mcg/hr (06/10/16 0525)   PRN Meds:.sodium chloride, albuterol, ammonium lactate, LORazepam, ondansetron **OR** ondansetron (ZOFRAN) IV, oxyCODONE, sodium chloride flush   PHYSICAL EXAM: Vital signs: Filed Vitals:   06/10/16 0442 06/10/16 0500 06/10/16 0609 06/10/16 0622  BP: 163/85  111/57 163/80  Pulse: 93  71 73  Temp: 98.5 F (36.9 C)  98.1 F (36.7 C) 98.3 F (36.8 C)  TempSrc: Oral  Oral Oral  Resp: 20  20 20   Height:      Weight:  151.501 kg (334 lb)    SpO2: 98%  97% 100%   Filed Weights   06/07/16  0433 06/09/16 0600 06/10/16 0500  Weight: 176.5 kg (389 lb 1.8 oz) 160.12 kg (353 lb) 151.501 kg (334 lb)   Body mass index is 46.6 kg/(m^2).   Gen Exam: Awake and alert with clear speech. Not in any distress  Neck: Supple, No JVD.   Chest: B/L Clear.   CVS: S1 S2 Regular, no murmurs.  Abdomen: soft, BS +, non tender, non distended. Obese abdomen Extremities: + edema, lower extremities warm to touch. Neurologic: Non Focal.   Skin: No Rash or lesions   Wounds: N/A.   LABORATORY DATA: CBC:  Recent Labs Lab 06/06/16 0050  06/07/16 0334 06/08/16 0500 06/08/16 1652 06/09/16 0511 06/10/16 0514  WBC 5.0  < > 4.0 3.8* 3.6* 3.7* 3.9*  NEUTROABS 2.1  --   --   --   --   --   --   HGB 7.3*  < > 8.6* 7.9* 7.9* 8.2* 8.0*  HCT 21.0*  < > 24.9* 22.8* 22.7* 23.4* 23.2*  MCV 108.2*  < > 104.2* 103.6* 104.6* 103.1* 104.0*  PLT 54*  < > 50* 41* 43* 35* 37*  < > = values in this interval not displayed.  Basic Metabolic Panel:  Recent Labs Lab 06/06/16 0930 06/07/16 0334 06/08/16  0500 06/09/16 0511 06/10/16 0514  NA 137 137 139 140 142  K 4.0 3.9 3.6 3.2* 3.2*  CL 110 111 111 109 106  CO2 GLUCOSE 87 116* 84 93 100*  BUN 21* 25* 25* 23* 18  CREATININE 1.59* 2.12* 1.76* 1.55* 1.21*  CALCIUM 7.7* 7.9* 8.3* 8.6* 9.1  MG  --   --  1.6*  --   --     GFR: Estimated Creatinine Clearance: 78.5 mL/min (by C-G formula based on Cr of 1.21).  Liver Function Tests:  Recent Labs Lab 06/06/16 0930 06/07/16 0334 06/08/16 0500 06/09/16 0511 06/10/16 0514  AST 64* 64* 56* 53* 49*  ALT ALKPHOS 104 92 74 66 61  BILITOT 5.0* 6.5* 6.1* 7.8* 9.4*  PROT 6.8 7.4 7.1 7.3 7.6  ALBUMIN 1.6* 2.1* 2.7* 3.0* 3.6   No results for input(s): LIPASE, AMYLASE in the last 168 hours.  Recent Labs Lab 06/06/16 0050  AMMONIA 86*    Coagulation Profile:  Recent Labs Lab 06/06/16 0050 06/07/16 0334 06/08/16 0500 06/09/16 0511 06/10/16 0514  INR 2.27* 2.14*  2.19* 2.29* 2.51*    Cardiac Enzymes:  Recent Labs Lab 06/06/16 0050  TROPONINI <0.03    BNP (last 3 results) No results for input(s): PROBNP in the last 8760 hours.  HbA1C: No results for input(s): HGBA1C in the last 72 hours.  CBG:  Recent Labs Lab 06/06/16 0751 06/08/16 0802 06/10/16 0731  GLUCAP 95 79 99    Lipid Profile: No results for input(s): CHOL, HDL, LDLCALC, TRIG, CHOLHDL, LDLDIRECT in the last 72 hours.  Thyroid Function Tests: No results for input(s): TSH, T4TOTAL, FREET4, T3FREE, THYROIDAB in the last 72 hours.  Anemia Panel:  Recent Labs  06/09/16 1309  RETICCTPCT 3.8*    Urine analysis:    Component Value Date/Time   COLORURINE ORANGE* 06/06/2016 0145   APPEARANCEUR CLOUDY* 06/06/2016 0145   LABSPEC 1.023 06/06/2016 0145   PHURINE 5.5 06/06/2016 0145   GLUCOSEU NEGATIVE 06/06/2016 0145   HGBUR TRACE* 06/06/2016 0145   BILIRUBINUR LARGE* 06/06/2016 0145   KETONESUR NEGATIVE 06/06/2016 0145   PROTEINUR NEGATIVE 06/06/2016 0145   UROBILINOGEN 1.0 04/25/2015 1355   NITRITE POSITIVE* 06/06/2016 0145   LEUKOCYTESUR SMALL* 06/06/2016 0145    Sepsis Labs: Lactic Acid, Venous    Component Value Date/Time   LATICACIDVEN 2.16* 06/06/2016 0101    MICROBIOLOGY: Recent Results (from the past 240 hour(s))  MRSA PCR Screening     Status: None   Collection Time: 06/06/16  4:04 AM  Result Value Ref Range Status   MRSA by PCR NEGATIVE NEGATIVE Final    Comment:        The GeneXpert MRSA Assay (FDA approved for NASAL specimens only), is one component of a comprehensive MRSA colonization surveillance program. It is not intended to diagnose MRSA infection nor to guide or monitor treatment for MRSA infections.   Urine culture     Status: Abnormal   Collection Time: 06/06/16  5:20 PM  Result Value Ref Range Status   Specimen Description URINE, CLEAN CATCH  Final   Special Requests NONE  Final   Culture MULTIPLE SPECIES PRESENT, SUGGEST  RECOLLECTION (A)  Final   Report Status 06/08/2016 FINAL  Final    RADIOLOGY STUDIES/RESULTS: US Renal  06/07/2016  CLINICAL DATA:  Acute renal failure. EXAM: RENAL / URINARY TRACT ULTRASOUND COMPLETE COMPARISON:  None. FINDINGS: Right Kidney: Length: 14.1 cm. Echogenicity within  normal limits. No mass or hydronephrosis visualized. Left Kidney: Length: 12.8 cm. Minimal pelviectasis without overt hydronephrosis. Normal echogenicity. Bladder: Not visualized. Degraded exam secondary to patient body habitus. IMPRESSION: 1. Minimal left-sided pelviectasis, without overt hydronephrosis. 2. Lack of visualization of the urinary bladder. Decreased sensitivity and specificity exam due to technique related factors, as described above. Electronically Signed   By: Jeronimo GreavesKyle  Talbot M.D.   On: 06/07/2016 15:00     LOS: 4 days   Jeralyn BennettZAMORA, Modupe Shampine, MD  [facility] Pager:[phone]  If 7PM-7AM, please contact night-coverage [email or web address] Password [email or web address] 06/10/2016, 12:07 PM

## 2016-06-10 NOTE — Op Note (Signed)
[facility] Patient Name: Diane Cook Procedure Date: 06/10/2016 MRN: [id number] Attending MD: Tresea Mall Dr., MD Date of Birth: Jul 14, 1956 CSN: [id number] Age: 60 Admit Type: Inpatient Procedure:                Upper GI endoscopy Indications:              Iron deficiency anemia secondary to chronic blood                            loss, Heme positive stool, Occult blood in stool,                            Suspected upper gastrointestinal bleeding in                            patient with chronic blood loss history of                            cirrhosis due to hepatitis C and to continued                            severe alcohol ingestion. INR has been elevated                            despite vitamin K and platelet count has been 50 or                            less. For this reason pediatric endoscope is used                            diagnostically. She had been taking Naprosyn prior                            to admission. Providers:                Llana Aliment. Lakysha Kossman Dr., MD, Priscella Mann, RN,                            Oletha Blend, Technician, Randon Goldsmith, CRNA Referring MD:              Medicines:                Propofol total dose 200 mg IV Complications:            No immediate complications. Estimated Blood Loss:     Estimated blood loss: none. Procedure:                Pre-Anesthesia Assessment:                           - Prior to the procedure, a History and Physical                            was performed, and patient medications and  allergies were reviewed. The patient's tolerance of                            previous anesthesia was also reviewed. The risks                            and benefits of the procedure and the sedation                            options and risks were discussed with the patient.                            All questions were answered, and informed consent      was obtained. Prior Anticoagulants: The patient has                            taken no previous anticoagulant or antiplatelet                            agents. ASA Grade Assessment: IV - A patient with                            severe systemic disease that is a constant threat                            to life. After reviewing the risks and benefits,                            the patient was deemed in satisfactory condition to                            undergo the procedure.                           After obtaining informed consent, the endoscope was                            passed under direct vision. Throughout the                            procedure, the patient's blood pressure, pulse, and                            oxygen saturations were monitored continuously. The                            [id number]) scope was introduced through the                            mouth, and advanced to the second part of duodenum.                            The upper GI endoscopy was accomplished without  difficulty. The patient tolerated the procedure                            well. Scope In: Scope Out: Findings:      There is no endoscopic evidence of Barrett's esophagus, bleeding,       esophagitis, inflammation, stricture or varices in the entire esophagus.      There is no endoscopic evidence of bleeding, erythema or inflammatory       changes suggestive of gastritis, inflammation, ulceration, varices or       tumor in the entire examined stomach.      Localized moderate inflammation characterized by erosions and friability       was found in the duodenal bulb. Impression:               - Duodenitis. this probably was the source of her                            bleeding in view of elevated INR and                            thrombocytopenia. No varices were demonstrated.                           - No specimens collected. Moderate Sedation:       MAC by anesthesia Recommendation:           - Return patient to hospital ward for ongoing care.                           - Resume previous diet.                           - Continue present medications. Procedure Code(s):        --- Professional ---                           [id number], Esophagogastroduodenoscopy, flexible,                            transoral; diagnostic, including collection of                            specimen(s) by brushing or washing, when performed                            (separate procedure) Diagnosis Code(s):        --- Professional ---                           K29.80, Duodenitis without bleeding                           D50.0, Iron deficiency anemia secondary to blood                            loss (chronic)  R19.5, Other fecal abnormalities                           R58, Hemorrhage, not elsewhere classified CPT copyright 2016 American Medical Association. All rights reserved. The codes documented in this report are preliminary and upon coder review may  be revised to meet current compliance requirements. Tresea Mall Dr., MD 06/10/2016 1:57:48 PM This report has been signed electronically. Number of Addenda: 0

## 2016-06-10 NOTE — Care Management Note (Signed)
Case Management Note  Patient Details  Name: Diane AlexandersJeane Cook MRN: [id number] Date of Birth: 03/02/1956  Subjective/Objective:  Provided patient w/HHC agency list since she currently declines SNF. Will check back tomorrow for choice.Patient home w/spouse, & grand dtr.Explained HHC(intermittent-only HHRN, HH aide, Child psychotherapistsocial worker) to patient. Patient also recc for home rw-wt is 334lbs.                  Action/Plan:d/c plan home.   Expected Discharge Date:                  Expected Discharge Plan:  Home w Home Health Services  In-House Referral:     Discharge planning Services  CM Consult  Post Acute Care Choice:    Choice offered to:  Patient  DME Arranged:    DME Agency:     HH Arranged:    HH Agency:     Status of Service:  In process, will continue to follow  Medicare Important Message Given:    Date Medicare IM Given:    Medicare IM give by:    Date Additional Medicare IM Given:    Additional Medicare Important Message give by:     If discussed at Long Length of Stay Meetings, dates discussed:    Additional Comments:  Lanier ClamMahabir, Leonides Minder, RN 06/10/2016, 4:02 PM

## 2016-06-10 NOTE — Progress Notes (Signed)
PT Cancellation Note  Patient Details Name: Emmit AlexandersJeane Sparks MRN: [id number] DOB: 1956-12-25   Cancelled Treatment:    Reason Eval/Treat Not Completed: Medical issues which prohibited therapy (has planned EGD today. check back 6/15)   Sharen HeckHill, Kennetta Pavlovic Elizabeth Yeison Sippel PT [phone]  06/10/2016, 7:11 AM

## 2016-06-10 NOTE — Anesthesia Preprocedure Evaluation (Addendum)
Anesthesia Evaluation  Patient identified by MRN, date of birth, ID band Patient awake    Reviewed: Allergy & Precautions, H&P , NPO status , Patient's Chart, lab work & pertinent test results  Airway Mallampati: II  TM Distance: >3 FB Neck ROM: Full    Dental no notable dental hx. (+) Teeth Intact, Dental Advisory Given   Pulmonary asthma , former smoker,    Pulmonary exam normal  + decreased breath sounds      Cardiovascular hypertension, Pt. on medications  Rhythm:Regular Rate:Normal     Neuro/Psych Bipolar Disorder CVA, Residual Symptoms    GI/Hepatic negative GI ROS, (+) Cirrhosis     substance abuse  alcohol use, Hepatitis -, C  Endo/Other  Morbid obesity  Renal/GU Renal InsufficiencyRenal disease  negative genitourinary   Musculoskeletal  (+) Arthritis , Osteoarthritis,    Abdominal (+) + obese,   Peds  Hematology negative hematology ROS (+) anemia ,   Anesthesia Other Findings   Reproductive/Obstetrics negative OB ROS                           Anesthesia Physical Anesthesia Plan  ASA: IV  Anesthesia Plan: MAC   Post-op Pain Management:    Induction: Intravenous  Airway Management Planned: Natural Airway and Nasal Cannula  Additional Equipment:   Intra-op Plan:   Post-operative Plan:   Informed Consent: I have reviewed the patients History and Physical, chart, labs and discussed the procedure including the risks, benefits and alternatives for the proposed anesthesia with the patient or authorized representative who has indicated his/her understanding and acceptance.     Plan Discussed with: CRNA and Surgeon  Anesthesia Plan Comments: (Possible intubation will be planned from beginning of case and therefore GA)       Anesthesia Quick Evaluation

## 2016-06-11 DIAGNOSIS — D689 Coagulation defect, unspecified: Secondary | ICD-10-CM

## 2016-06-11 DIAGNOSIS — K7030 Alcoholic cirrhosis of liver without ascites: Secondary | ICD-10-CM

## 2016-06-11 LAB — BASIC METABOLIC PANEL
Anion gap: 7 (ref 5–15)
BUN: 18 mg/dL (ref 6–20)
CHLORIDE: 104 mmol/L (ref 101–111)
CO2: 29 mmol/L (ref 22–32)
CREATININE: 1.22 mg/dL — AB (ref 0.44–1.00)
Calcium: 9.1 mg/dL (ref 8.9–10.3)
GFR calc Af Amer: 54 mL/min — ABNORMAL LOW (ref >60)
GFR calc non Af Amer: 46 mL/min — ABNORMAL LOW (ref >60)
GLUCOSE: 90 mg/dL (ref 65–99)
Potassium: 3.1 mmol/L — ABNORMAL LOW (ref 3.5–5.1)
Sodium: 140 mmol/L (ref 135–145)

## 2016-06-11 LAB — CBC
HEMATOCRIT: 22.9 % — AB (ref 36.0–46.0)
HEMOGLOBIN: 7.8 g/dL — AB (ref 12.0–15.0)
MCH: 35.9 pg — AB (ref 26.0–34.0)
MCHC: 34.1 g/dL (ref 30.0–36.0)
MCV: 105.5 fL — AB (ref 78.0–100.0)
Platelets: 36 10*3/uL — ABNORMAL LOW (ref 150–400)
RBC: 2.17 MIL/uL — ABNORMAL LOW (ref 3.87–5.11)
RDW: 22.5 % — ABNORMAL HIGH (ref 11.5–15.5)
WBC: 4.2 10*3/uL (ref 4.0–10.5)

## 2016-06-11 LAB — GLUCOSE, CAPILLARY: GLUCOSE-CAPILLARY: 102 mg/dL — AB (ref 65–99)

## 2016-06-11 LAB — PREPARE RBC (CROSSMATCH)

## 2016-06-11 MED ORDER — SODIUM CHLORIDE 0.9 % IV SOLN
Freq: Once | INTRAVENOUS | Status: AC
  Administered 2016-06-11: 09:00:00 via INTRAVENOUS

## 2016-06-11 MED ORDER — POTASSIUM CHLORIDE CRYS ER 20 MEQ PO TBCR
40.0000 meq | EXTENDED_RELEASE_TABLET | ORAL | Status: AC
  Administered 2016-06-11 (×2): 40 meq via ORAL
  Filled 2016-06-11 (×2): qty 2

## 2016-06-11 MED ORDER — CEFUROXIME AXETIL 500 MG PO TABS
500.0000 mg | ORAL_TABLET | Freq: Two times a day (BID) | ORAL | Status: DC
  Administered 2016-06-11 – 2016-06-12 (×2): 500 mg via ORAL
  Filled 2016-06-11 (×5): qty 1

## 2016-06-11 NOTE — Care Management Note (Signed)
Case Management Note  Patient Details  Name: Diane Cook MRN: [id number] Date of Birth: Nov 07, 1956  Subjective/Objective: Patient chose [facility]-rep Clydie BraunKaren notified. recc HHRN/social worker.home rw-[facility] dme rep aware. Await HHC/DMe orders, [id number]. PTAR needed @ d/c-confirmed address on face sheet.                   Action/Plan:d/c plan home w/HHC/rw.   Expected Discharge Date:                  Expected Discharge Plan:  Home w Home Health Services  In-House Referral:     Discharge planning Services  CM Consult  Post Acute Care Choice:    Choice offered to:  Patient  DME Arranged:    DME Agency:     HH Arranged:    HH Agency:  [facility]  Status of Service:  In process, will continue to follow  Medicare Important Message Given:    Date Medicare IM Given:    Medicare IM give by:    Date Additional Medicare IM Given:    Additional Medicare Important Message give by:     If discussed at Long Length of Stay Meetings, dates discussed:    Additional Comments:  Lanier ClamMahabir, Sem Mccaughey, RN 06/11/2016, 3:51 PM

## 2016-06-11 NOTE — Progress Notes (Signed)
Physical Therapy Treatment Patient Details Name: Emmit AlexandersJeane Malta MRN: [id number] DOB: Mar 22, 1956 Today's Date: 06/11/2016    History of Present Illness 60 yo female admitted with acute UGIB. hx of CHF, HTN CVA, ETOH abuse, bipolar d/o, arthritis.     PT Comments    Pt requires MAX encouragement to participate.  Required assist to get OOB but tolerated amb 46 feet in hallway with walker.  Assisted back to bed per pt request.  C/o of Mod fatigue with this activity but appears at prior level.   Follow Up Recommendations  SNF;Supervision/Assistance - 24 hour (LPT rec SNF however pt declines so will need HH PT)     Equipment Recommendations  Rolling walker with 5" wheels;3in1 (PT)    Recommendations for Other Services       Precautions / Restrictions Precautions Precautions: Fall Restrictions Weight Bearing Restrictions: No    Mobility  Bed Mobility Overal bed mobility: Needs Assistance Bed Mobility: Supine to Sit;Sit to Supine     Supine to sit: Mod assist Sit to supine: Mod assist   General bed mobility comments: assist with upper body for supine to sit and assist with B LE's up onto bed due to BMI  Transfers Overall transfer level: Needs assistance Equipment used: Rolling walker (2 wheeled) Transfers: Sit to/from Stand Sit to Stand: Min guard         General transfer comment: pt able to self rise using forward momentum weigh shift.  Pt able to self sit same fashion with control.  Ambulation/Gait Ambulation/Gait assistance: Supervision;Min guard Ambulation Distance (Feet): 46 Feet Assistive device: Rolling walker (2 wheeled) Gait Pattern/deviations: Step-through pattern;Decreased step length - right;Decreased step length - left;Shuffle;Wide base of support     General Gait Details: pt tolerated increased distance using RW with increased time.  Mod c/o fatigue after activity.  Avg HR 87.  Limited activity tolerance but appears at prior level.   Stairs             Wheelchair Mobility    Modified Rankin (Stroke Patients Only)       Balance                                    Cognition Arousal/Alertness: Awake/alert Behavior During Therapy: WFL for tasks assessed/performed                        Exercises      General Comments        Pertinent Vitals/Pain Pain Assessment: No/denies pain    Home Living                      Prior Function            PT Goals (current goals can now be found in the care plan section) Progress towards PT goals: Progressing toward goals    Frequency  Min 3X/week    PT Plan Current plan remains appropriate    Co-evaluation             End of Session Equipment Utilized During Treatment: Gait belt Activity Tolerance: Patient limited by fatigue Patient left: in bed;with call bell/phone within reach;with bed alarm set;with family/visitor present     Time: [id number] PT Time Calculation (min) (ACUTE ONLY): 26 min  Charges:  $Gait Training: 8-22 mins $Therapeutic Activity: 8-22 mins  G Codes:      Rica Koyanagi  PTA [facility]  Acute  Rehab Pager      [phone]

## 2016-06-11 NOTE — Progress Notes (Signed)
PROGRESS NOTE        PATIENT DETAILS Name: Diane Cook Age: 60 y.o. Sex: female Date of Birth: 11/28/1956 Admit Date: 06/06/2016 Admitting Physician Briscoe Deutscher, MD AVW:[id number], Ellison Carwin, MD  Brief Narrative: Patient is a 60 y.o. female cirrhosis, Hepatitis C, ongoing alcohol abuse  presenting with lightheadedness and presyncope, found to have hemoglobin of 7.3-felt to be acute blood loss anemia in the setting of upper GI bleeding. Admitted for further evaluation and treatment  Subjective: She was assisted out of bed to chair, thinks overall she is doing better  Assessment/Plan: Principal Problem: Acute upper gastrointestinal bleeding:  -Likely secondary to either variceal/portal gastropathy related bleeding or ulcer related bleeding (NSAID use prior to this admission). Continue PPI and octreotide infusion,  -GI consulted, was scheduled for a endoscopic evaluation on 6/12-but given worsening thrombocytopenia and coronary artery EGD postponed.  -During this hospitalization she has received a total of 5 units of packed red blood cells however hemoglobin not responding appropriately, suspect ongoing slow bleed. -Upper endoscopy performed on 06/10/2016 revealed duodenitis.. This could possibly reflect source of bleeding given elevated INR and thrombocytopenia. GI did not see evidence of esophageal varices.  Active Problems: Acute blood loss anemia:  -Like he secondary to upper GI bleed. She has been transfused 5 units unit of PRBC so far, last hemoglobin transfusion was on 6/12. Hemoglobin is not appropriately increasing after this many units of PRBC transfusion, as noted above -LDH within normal limits at 188, doubt hemolytic anemia. -Haptoglobin less than 10 which could be explained by her history of cirrhosis -Plan to transfuse another 2 units packed red blood cells today, repeat labs in a.m.  Acute renal failure: Likely prerenal azotemia- secondary to  above, NSAID and diuretic use. Urine sodium 79, and argues against hepatorenal. Volume status much improved-however still has lower extremity edema-continue intravenous Lasix, will continue for 1 more day, before transitioning back to oral diuretic regimen.  Renal ultrasound negative for hydronephrosis.  -On 06/10/2016 creatinine improving, trending down to 1.2 from 2.12 (06/07/2016)  Liver cirrhosis: Likely secondary to alcohol abuse and hepatitis C. MELD score 24.Per gastroenterology-not a liver transplant candidate due to ongoing alcohol abuse.  -Will stop IV ceftriaxone, transition to Ceftin  Thrombocytopenia: Secondary to underlying liver cirrhosis with hypersplenism. Repeat lab work on 06/11/2016 showing a platelet count of 36,000  Coagulopathy: Secondary to liver cirrhosis, given multiple doses of vitamin K throughout this hospital stay, INR continues to be significantly elevated.  Hypokalemia. -A.m. labs showing potassium of 3.1 -Will replace with Kdur 40 meq PO x2  Alcohol abuse: Last drink 6/9, no signs of withdrawal. Continue Ativan per protocol. Counseled extensively.  Volume overload: Likely secondary to liver cirrhosis and hypoalbuminemia, check daily weights, improving with intravenous furosemide. Negative balance of 3.1 L so far, I suspect weights are unreliable at this time.   Chronic hepatitis C: Refer to infectious disease clinic as outpatient.  Prominent intra-hepatic biliary ducts: Seen on CT scan abdomen in June 2016-gastroenterology planning on MRCP at some point. Her LFTs are elevated-this is likely secondary to liver cirrhosis and alcohol abuse rather than this issue.  Jaundice/elevated liver enzymes: Suspect secondary to alcoholic liver cirrhosis.   Deconditioning: Was extremely weak even prior to this admission-apparently was walking with the help of a walker, she was not very mobile because of weakness and anemia for 1  week prior to this admission. With her acute  illness, she has become even more deconditioned, PT evaluation completed, patient agreeable for SNF  DVT Prophylaxis: SCD's  Code Status: Full code  Family Communication: I spoke with her spouse at bedside  Disposition Plan: Remain inpatient--suspect she requires 1-2 more days of hospitalization prior to discharge to SNF.  Antimicrobial agents: IV Rocephin 6/10>>6/15  Procedures: None  CONSULTS:  GI  Time spent: 25 minutes-Greater than 50% of this time was spent in counseling, explanation of diagnosis, planning of further management, and coordination of care.  MEDICATIONS: Anti-infectives    Start     Dose/Rate Route Frequency Ordered Stop   06/11/16 0600  cefTRIAXone (ROCEPHIN) 2 g in dextrose 5 % 50 mL IVPB     2 g 100 mL/hr over 30 Minutes Intravenous Daily 06/10/16 0756     06/06/16 0415  cefTRIAXone (ROCEPHIN) 1 g in dextrose 5 % 50 mL IVPB  Status:  Discontinued     1 g 100 mL/hr over 30 Minutes Intravenous Daily 06/06/16 0352 06/10/16 0756      Scheduled Meds: . cefTRIAXone (ROCEPHIN)  IV  2 g Intravenous Q0600  . fluticasone  2 spray Each Nare Daily  . folic acid  1 mg Intravenous Daily  . furosemide  40 mg Intravenous Q12H  . lactulose  20 g Oral BID  . pantoprazole  40 mg Intravenous Q12H  . sodium chloride flush  3 mL Intravenous Q12H  . sodium chloride flush  3 mL Intravenous Q12H  . spironolactone  50 mg Oral Daily  . thiamine  100 mg Intravenous Daily   Continuous Infusions: . octreotide  (SANDOSTATIN)    IV infusion 50 mcg/hr (06/11/16 0127)   PRN Meds:.sodium chloride, albuterol, ammonium lactate, LORazepam, ondansetron **OR** ondansetron (ZOFRAN) IV, oxyCODONE, sodium chloride flush   PHYSICAL EXAM: Vital signs: Filed Vitals:   06/11/16 0815 06/11/16 0850 06/11/16 1100 06/11/16 1105  BP: 180/78 165/73 179/80 175/70  Pulse: 75 75 70 72  Temp: 98 F (36.7 C) 98.2 F (36.8 C) 98.6 F (37 C) 98.5 F (36.9 C)  TempSrc:      Resp: Height:      Weight:      SpO2: 95% 94% 98% 96%   Filed Weights   06/09/16 0600 06/10/16 0500 06/10/16 1217  Weight: 160.12 kg (353 lb) 151.501 kg (334 lb) 151.501 kg (334 lb)   Body mass index is 46.6 kg/(m^2).   Gen Exam: Awake and alert with clear speech. Not in any distress  Neck: Supple, No JVD.   Chest: B/L Clear.   CVS: S1 S2 Regular, no murmurs.  Abdomen: soft, BS +, non tender, non distended. Obese abdomen Extremities: + edema, lower extremities warm to touch. Neurologic: Non Focal.   Skin: No Rash or lesions   Wounds: N/A.   LABORATORY DATA: CBC:  Recent Labs Lab 06/06/16 0050  06/08/16 0500 06/08/16 1652 06/09/16 0511 06/10/16 0514 06/11/16 0450  WBC 5.0  < > 3.8* 3.6* 3.7* 3.9* 4.2  NEUTROABS 2.1  --   --   --   --   --   --   HGB 7.3*  < > 7.9* 7.9* 8.2* 8.0* 7.8*  HCT 21.0*  < > 22.8* 22.7* 23.4* 23.2* 22.9*  MCV 108.2*  < > 103.6* 104.6* 103.1* 104.0* 105.5*  PLT 54*  < > 41* 43* 35* 37* 36*  < > = values in this interval not displayed.  Basic Metabolic Panel:  Recent Labs Lab 06/07/16 0334 06/08/16 0500 06/09/16 0511 06/10/16 0514 06/11/16 0450  NA 137 139 140 142 140  K 3.9 3.6 3.2* 3.2* 3.1*  CL 111 111 109 106 104  CO2 22 23 25 29 29   GLUCOSE 116* 84 93 100* 90  BUN 25* 25* 23* 18 18  CREATININE 2.12* 1.76* 1.55* 1.21* 1.22*  CALCIUM 7.9* 8.3* 8.6* 9.1 9.1  MG  --  1.6*  --   --   --     GFR: Estimated Creatinine Clearance: 77.8 mL/min (by C-G formula based on Cr of 1.22).  Liver Function Tests:  Recent Labs Lab 06/06/16 0930 06/07/16 0334 06/08/16 0500 06/09/16 0511 06/10/16 0514  AST 64* 64* 56* 53* 49*  ALT 29 28 25 23 23   ALKPHOS 104 92 74 66 61  BILITOT 5.0* 6.5* 6.1* 7.8* 9.4*  PROT 6.8 7.4 7.1 7.3 7.6  ALBUMIN 1.6* 2.1* 2.7* 3.0* 3.6   No results for input(s): LIPASE, AMYLASE in the last 168 hours.  Recent Labs Lab 06/06/16 0050  AMMONIA 86*    Coagulation Profile:  Recent Labs Lab  06/06/16 0050 06/07/16 0334 06/08/16 0500 06/09/16 0511 06/10/16 0514  INR 2.27* 2.14* 2.19* 2.29* 2.51*    Cardiac Enzymes:  Recent Labs Lab 06/06/16 0050  TROPONINI <0.03    BNP (last 3 results) No results for input(s): PROBNP in the last 8760 hours.  HbA1C: No results for input(s): HGBA1C in the last 72 hours.  CBG:  Recent Labs Lab 06/06/16 0751 06/08/16 0802 06/10/16 0731 06/11/16 0808  GLUCAP 95 79 99 102*    Lipid Profile: No results for input(s): CHOL, HDL, LDLCALC, TRIG, CHOLHDL, LDLDIRECT in the last 72 hours.  Thyroid Function Tests: No results for input(s): TSH, T4TOTAL, FREET4, T3FREE, THYROIDAB in the last 72 hours.  Anemia Panel:  Recent Labs  06/09/16 1309  RETICCTPCT 3.8*    Urine analysis:    Component Value Date/Time   COLORURINE ORANGE* 06/06/2016 0145   APPEARANCEUR CLOUDY* 06/06/2016 0145   LABSPEC 1.023 06/06/2016 0145   PHURINE 5.5 06/06/2016 0145   GLUCOSEU NEGATIVE 06/06/2016 0145   HGBUR TRACE* 06/06/2016 0145   BILIRUBINUR LARGE* 06/06/2016 0145   KETONESUR NEGATIVE 06/06/2016 0145   PROTEINUR NEGATIVE 06/06/2016 0145   UROBILINOGEN 1.0 04/25/2015 1355   NITRITE POSITIVE* 06/06/2016 0145   LEUKOCYTESUR SMALL* 06/06/2016 0145    Sepsis Labs: Lactic Acid, Venous    Component Value Date/Time   LATICACIDVEN 2.16* 06/06/2016 0101    MICROBIOLOGY: Recent Results (from the past 240 hour(s))  MRSA PCR Screening     Status: None   Collection Time: 06/06/16  4:04 AM  Result Value Ref Range Status   MRSA by PCR NEGATIVE NEGATIVE Final    Comment:        The GeneXpert MRSA Assay (FDA approved for NASAL specimens only), is one component of a comprehensive MRSA colonization surveillance program. It is not intended to diagnose MRSA infection nor to guide or monitor treatment for MRSA infections.   Urine culture     Status: Abnormal   Collection Time: 06/06/16  5:20 PM  Result Value Ref Range Status   Specimen  Description URINE, CLEAN CATCH  Final   Special Requests NONE  Final   Culture MULTIPLE SPECIES PRESENT, SUGGEST RECOLLECTION (A)  Final   Report Status 06/08/2016 FINAL  Final    RADIOLOGY STUDIES/RESULTS: US Renal  06/07/2016  CLINICAL DATA:  Acute renal failure. EXAM: RENAL /  URINARY TRACT ULTRASOUND COMPLETE COMPARISON:  None. FINDINGS: Right Kidney: Length: 14.1 cm. Echogenicity within normal limits. No mass or hydronephrosis visualized. Left Kidney: Length: 12.8 cm. Minimal pelviectasis without overt hydronephrosis. Normal echogenicity. Bladder: Not visualized. Degraded exam secondary to patient body habitus. IMPRESSION: 1. Minimal left-sided pelviectasis, without overt hydronephrosis. 2. Lack of visualization of the urinary bladder. Decreased sensitivity and specificity exam due to technique related factors, as described above. Electronically Signed   By: Jeronimo GreavesKyle  Talbot M.D.   On: 06/07/2016 15:00     LOS: 5 days   Jeralyn BennettZAMORA, Reshawn Ostlund, MD  [facility] Pager:[phone]  If 7PM-7AM, please contact night-coverage [email or web address] Password [email or web address] 06/11/2016, 1:01 PM

## 2016-06-12 LAB — TYPE AND SCREEN
ABO/RH(D): O POS
Antibody Screen: NEGATIVE
Unit division: 0
Unit division: 0

## 2016-06-12 LAB — BASIC METABOLIC PANEL
ANION GAP: 7 (ref 5–15)
BUN: 15 mg/dL (ref 6–20)
CALCIUM: 8.9 mg/dL (ref 8.9–10.3)
CHLORIDE: 103 mmol/L (ref 101–111)
CO2: 30 mmol/L (ref 22–32)
CREATININE: 1.09 mg/dL — AB (ref 0.44–1.00)
GFR calc non Af Amer: 53 mL/min — ABNORMAL LOW (ref >60)
Glucose, Bld: 99 mg/dL (ref 65–99)
Potassium: 3.1 mmol/L — ABNORMAL LOW (ref 3.5–5.1)
SODIUM: 140 mmol/L (ref 135–145)

## 2016-06-12 LAB — CBC
HEMATOCRIT: 26.6 % — AB (ref 36.0–46.0)
Hemoglobin: 9.1 g/dL — ABNORMAL LOW (ref 12.0–15.0)
MCH: 35 pg — ABNORMAL HIGH (ref 26.0–34.0)
MCHC: 34.2 g/dL (ref 30.0–36.0)
MCV: 102.3 fL — AB (ref 78.0–100.0)
Platelets: 38 10*3/uL — ABNORMAL LOW (ref 150–400)
RBC: 2.6 MIL/uL — ABNORMAL LOW (ref 3.87–5.11)
RDW: 24.2 % — ABNORMAL HIGH (ref 11.5–15.5)
WBC: 4.2 10*3/uL (ref 4.0–10.5)

## 2016-06-12 LAB — GLUCOSE, CAPILLARY: GLUCOSE-CAPILLARY: 92 mg/dL (ref 65–99)

## 2016-06-12 MED ORDER — POTASSIUM CHLORIDE CRYS ER 20 MEQ PO TBCR
40.0000 meq | EXTENDED_RELEASE_TABLET | ORAL | Status: DC
  Administered 2016-06-12 (×2): 40 meq via ORAL
  Filled 2016-06-12 (×2): qty 2

## 2016-06-12 MED ORDER — LACTULOSE 10 GM/15ML PO SOLN: 20.0000 g | Freq: Two times a day (BID) | ORAL | Status: DC

## 2016-06-12 MED ORDER — HYDRALAZINE HCL 20 MG/ML IJ SOLN
5.0000 mg | INTRAMUSCULAR | Status: DC | PRN
  Administered 2016-06-12: 5 mg via INTRAVENOUS
  Filled 2016-06-12: qty 1

## 2016-06-12 NOTE — Progress Notes (Signed)
Report called to [facility]. Answered all questions.

## 2016-06-12 NOTE — Discharge Summary (Signed)
Physician Discharge Summary  Diane Cook ZOX:[id number] DOB: Sep 30, 1956 DOA: 06/06/2016  PCP: Lora Paula, MD  Admit date: 06/06/2016 Discharge date: 06/12/2016  Time spent: 35 minutes  Recommendations for Outpatient Follow-up:  1. Please follow-up on CBC, she underwent multiple blood transfusions during this hospitalization, source of GI bleed felt to be upper GI. On day of discharge she had a hemoglobin of 9.1 with hematocrit of 26.6. 2. She will need hospital follow-up with [facility] in 1-2 weeks 3. She was discharged to skilled nursing facility on 06/12/2016   Discharge Diagnoses:  Principal Problem:   Acute upper gastrointestinal bleeding Active Problems:   Alcohol abuse   Coagulopathy (HCC)   Thrombocytopenia (HCC)   Hepatitis C   Alcoholic cirrhosis of liver (HCC)   Symptomatic anemia   AKI (acute kidney injury) (HCC)   Acute kidney injury (nontraumatic) (HCC)   Anemia due to blood loss   Elevated lactic acid level   Jaundice   Discharge Condition: Stable  Diet recommendation: 1,500 Fluid restriction low-sodium diet  Filed Weights   06/10/16 0500 06/10/16 1217 06/12/16 0542  Weight: 151.501 kg (334 lb) 151.501 kg (334 lb) 349.8 kg (771 lb 2.7 oz)    History of present illness:  Diane Cook is a 60 y.o. female with medical history significant for chronic hepatitis C, alcohol abuse, and liver cirrhosis presenting to the emergency department with 1 week of headaches and lightheadedness, culminating with a near syncopal episode just prior to arrival. Patient reports ongoing alcohol abuse, though she has cut back to approximately 8 drinks per day. She had been in her usual state until approximately one week ago when she developed mild headaches and lightheadedness, particularly upon standing. Symptoms continued to progress and today she became very lightheaded and fell backwards, being caught by her husband and eased to the ground. She is not sure if there was a  momentary loss of consciousness, but there was no head strike and no resulting pain. She denies recent fevers or chills, denies nausea or vomiting, denies change in bowel habits, denies melena or hematochezia, but reports light blood streaking on the toilet paper. She has not been treated for hepatitis C and has never undergone EGD. Of note some minimal gastric and esophageal varices were noted on abdominal CT from June 2016. Patient denies history of GI bleed.  Hospital Course:  Acute upper gastrointestinal bleeding:  -Likely secondary to either variceal/portal gastropathy related bleeding or ulcer related bleeding (NSAID use prior to this admission). Continue PPI and octreotide infusion,  -GI consulted, was scheduled for a endoscopic evaluation on 6/12-but given worsening thrombocytopenia and coronary artery EGD postponed.  -During this hospitalization she has received a total of 5 units of packed red blood cells however hemoglobin not responding appropriately, suspect ongoing slow bleed. -Upper endoscopy performed on 06/10/2016 revealed duodenitis.. This could possibly reflect source of bleeding given elevated INR and thrombocytopenia. GI did not see evidence of esophageal varices. -On 06/12/2016 she remains stable, has not had episodes of bright red blood per rectum or melena.  Active Problems:  Acute blood loss anemia:  -Like he secondary to upper GI bleed. During this hospitalization she underwent multiple blood transfusions -LDH within normal limits at 188, doubt hemolytic anemia. -Haptoglobin less than 10 which could be explained by her history of cirrhosis -She underwent EGD on 06/10/2016 that revealed duodenitis. GI felt that this probably was a source of her bleeding given elevated INR and thrombocytopenia. She was not found to have esophageal  varices on this study. -She was last transfused with 2 units of packed red blood cells on 06/11/2016 -Repeat lab work on 06/12/2016 showing  hemoglobin 9.1 with hematocrit 26.6. -Please check a CBC in 4-5 days  Acute renal failure: Likely prerenal azotemia- secondary to above, NSAID and diuretic use. Urine sodium 79, and argues against hepatorenal. Volume status much improved-however still has lower extremity edema-continue intravenous Lasix, will continue for 1 more day, before transitioning back to oral diuretic regimen. Renal ultrasound negative for hydronephrosis.  -On 06/10/2016 creatinine improving, trending down to 1.2 from 2.12 (06/07/2016)  Liver cirrhosis: Likely secondary to alcohol abuse and hepatitis C. MELD score 24.Per gastroenterology-not a liver transplant candidate due to ongoing alcohol abuse.   Thrombocytopenia: Secondary to underlying liver cirrhosis with hypersplenism. -Lab work on 06/12/2016 showing platelet count of 30,000  Coagulopathy:  -Secondary to liver cirrhosis  Hypokalemia. -A.m. labs on 06/12/2016 showing potassium of 3.1 -Will replace with Kdur 40 meq PO x3  Alcohol abuse: Last drink 6/9, no signs of withdrawal. Continue Ativan per protocol. Counseled extensively.  Volume overload: Likely secondary to liver cirrhosis and hypoalbuminemia -She will be discharged on spironolactone 50 mg by mouth daily and Lasix 40 mg by mouth daily -Monitor volume status  Deconditioning: Was extremely weak even prior to this admission-apparently was walking with the help of a walker, she was not very mobile because of weakness and anemia for 1 week prior to this admission. With her acute illness, she has become even more deconditioned, PT evaluation completed, patient agreeable for SNF -She was discharged to skilled nursing facility on 06/12/2016  Procedures:  EGD performed on 06/12/2016 Impression:                          - Duodenitis. this probably was the source of her   bleeding in view of elevated INR and   thrombocytopenia. No varices were  demonstrated.  - No specimens collected.  Consultations:  GI  Discharge Exam: Filed Vitals:   06/11/16 2122 06/12/16 0500  BP: 158/85 174/74  Pulse: 80 75  Temp: 98.4 F (36.9 C) 98.3 F (36.8 C)  Resp: 20 18   Gen Exam: Awake and alert with clear speech. Not in any distress  Neck: Supple, No JVD.  Chest: B/L Clear.  CVS: S1 S2 Regular, no murmurs.  Abdomen: soft, BS +, non tender, non distended. Obese abdomen Extremities: + edema, lower extremities warm to touch. Neurologic: Non Focal.  Skin: No Rash or lesions  Wounds: N/A.   Discharge Instructions   Discharge Instructions    Call MD for:  difficulty breathing, headache or visual disturbances    Complete by:  As directed      Call MD for:  extreme fatigue    Complete by:  As directed      Call MD for:  hives    Complete by:  As directed      Call MD for:  persistant dizziness or light-headedness    Complete by:  As directed      Call MD for:  persistant nausea and vomiting    Complete by:  As directed      Call MD for:  redness, tenderness, or signs of infection (pain, swelling, redness, odor or green/yellow discharge around incision site)    Complete by:  As directed      Call MD for:  severe uncontrolled pain    Complete by:  As directed  Call MD for:  temperature >100.4    Complete by:  As directed      Call MD for:    Complete by:  As directed      Diet - low sodium heart healthy    Complete by:  As directed      Increase activity slowly    Complete by:  As directed           Current Discharge Medication List    START taking these medications   Details  lactulose (CHRONULAC) 10 GM/15ML solution Take 30 mLs (20 g total) by mouth 2 (two) times daily. Qty: 240 mL, Refills: 0      CONTINUE these medications which have NOT CHANGED   Details  albuterol (PROVENTIL HFA;VENTOLIN HFA) 108 (90 Base) MCG/ACT inhaler Inhale 2 puffs into the lungs every 6 (six) hours as  needed for wheezing or shortness of breath. Qty: 1 each, Refills: 1    ammonium lactate (LAC-HYDRIN) 12 % cream Apply topically as needed for dry skin. Qty: 385 g, Refills: 0   Associated Diagnoses: Xerosis of skin    fluticasone (FLONASE) 50 MCG/ACT nasal spray Place 2 sprays into both nostrils daily. Qty: 16 g, Refills: 1    folic acid (FOLVITE) 1 MG tablet Take 1 tablet (1 mg total) by mouth daily. Qty: 30 tablet, Refills: 5   Associated Diagnoses: Thrombocytopenia (HCC)    furosemide (LASIX) 40 MG tablet Take 1 tablet (40 mg total) by mouth daily. Qty: 30 tablet, Refills: 0    spironolactone (ALDACTONE) 50 MG tablet Take 1 tablet (50 mg total) by mouth daily. Qty: 30 tablet, Refills: 3    Multiple Vitamins-Minerals (MULTIVITAMIN) tablet Take 1 tablet by mouth daily. Qty: 30 tablet, Refills: 5   Associated Diagnoses: Thrombocytopenia (HCC)    potassium chloride SA (K-DUR,KLOR-CON) 10 MEQ tablet Take 1 tablet (10 mEq total) by mouth daily. Qty: 30 tablet, Refills: 1   Associated Diagnoses: Hypokalemia    thiamine 100 MG tablet Take 1 tablet (100 mg total) by mouth daily. Qty: 30 tablet, Refills: 5   Associated Diagnoses: Thrombocytopenia (HCC)      STOP taking these medications     naproxen (NAPROSYN) 500 MG tablet        No Known Allergies Follow-up Information    Follow up with Lora Paula, MD In 2 weeks.   Specialty:  Family Medicine   Contact information:   [address] [place] [address] [phone]       Follow up with EDWARDS JR,JAMES L, MD In 1 week.   Specialty:  Gastroenterology   Contact information:   [address] [place] [address] [phone]        The results of significant diagnostics from this hospitalization (including imaging, microbiology, ancillary and laboratory) are listed below for reference.    Significant Diagnostic Studies: US Renal  06/07/2016  CLINICAL DATA:  Acute renal failure. EXAM:  RENAL / URINARY TRACT ULTRASOUND COMPLETE COMPARISON:  None. FINDINGS: Right Kidney: Length: 14.1 cm. Echogenicity within normal limits. No mass or hydronephrosis visualized. Left Kidney: Length: 12.8 cm. Minimal pelviectasis without overt hydronephrosis. Normal echogenicity. Bladder: Not visualized. Degraded exam secondary to patient body habitus. IMPRESSION: 1. Minimal left-sided pelviectasis, without overt hydronephrosis. 2. Lack of visualization of the urinary bladder. Decreased sensitivity and specificity exam due to technique related factors, as described above. Electronically Signed   By: Jeronimo Greaves M.D.   On: 06/07/2016 15:00  Microbiology: Recent Results (from the past 240 hour(s))  MRSA PCR Screening     Status: None   Collection Time: 06/06/16  4:04 AM  Result Value Ref Range Status   MRSA by PCR NEGATIVE NEGATIVE Final    Comment:        The GeneXpert MRSA Assay (FDA approved for NASAL specimens only), is one component of a comprehensive MRSA colonization surveillance program. It is not intended to diagnose MRSA infection nor to guide or monitor treatment for MRSA infections.   Urine culture     Status: Abnormal   Collection Time: 06/06/16  5:20 PM  Result Value Ref Range Status   Specimen Description URINE, CLEAN CATCH  Final   Special Requests NONE  Final   Culture MULTIPLE SPECIES PRESENT, SUGGEST RECOLLECTION (A)  Final   Report Status 06/08/2016 FINAL  Final     Labs: Basic Metabolic Panel:  Recent Labs Lab 06/08/16 0500 06/09/16 0511 06/10/16 0514 06/11/16 0450 06/12/16 0444  NA 139 140 142 140 140  K 3.6 3.2* 3.2* 3.1* 3.1*  CL 111 109 106 104 103  CO2 23 25 29 29 30   GLUCOSE 84 93 100* 90 99  BUN 25* 23* 18 18 15   CREATININE 1.76* 1.55* 1.21* 1.22* 1.09*  CALCIUM 8.3* 8.6* 9.1 9.1 8.9  MG 1.6*  --   --   --   --    Liver Function Tests:  Recent Labs Lab 06/06/16 0930 06/07/16 0334 06/08/16 0500 06/09/16 0511 06/10/16 0514  AST 64* 64*  56* 53* 49*  ALT 29 28 25 23 23   ALKPHOS 104 92 74 66 61  BILITOT 5.0* 6.5* 6.1* 7.8* 9.4*  PROT 6.8 7.4 7.1 7.3 7.6  ALBUMIN 1.6* 2.1* 2.7* 3.0* 3.6   No results for input(s): LIPASE, AMYLASE in the last 168 hours.  Recent Labs Lab 06/06/16 0050  AMMONIA 86*   CBC:  Recent Labs Lab 06/06/16 0050  06/08/16 1652 06/09/16 0511 06/10/16 0514 06/11/16 0450 06/12/16 0444  WBC 5.0  < > 3.6* 3.7* 3.9* 4.2 4.2  NEUTROABS 2.1  --   --   --   --   --   --   HGB 7.3*  < > 7.9* 8.2* 8.0* 7.8* 9.1*  HCT 21.0*  < > 22.7* 23.4* 23.2* 22.9* 26.6*  MCV 108.2*  < > 104.6* 103.1* 104.0* 105.5* 102.3*  PLT 54*  < > 43* 35* 37* 36* 38*  < > = values in this interval not displayed. Cardiac Enzymes:  Recent Labs Lab 06/06/16 0050  TROPONINI <0.03   BNP: BNP (last 3 results) No results for input(s): BNP in the last 8760 hours.  ProBNP (last 3 results) No results for input(s): PROBNP in the last 8760 hours.  CBG:  Recent Labs Lab 06/06/16 0751 06/08/16 0802 06/10/16 0731 06/11/16 0808 06/12/16 0739  GLUCAP 95 79 99 102* 92       Signed:  Jeralyn BennettZAMORA, Breland Trouten MD.  [facility] 06/12/2016, 2:50 PM

## 2016-06-12 NOTE — Care Management Note (Signed)
Case Management Note  Patient Details  Name: Diane Cook MRN: [id number] Date of Birth: November 19, 1956  Subjective/Objective: Noted current d/c plan d/c SNF-CSW following.                   Action/Plan:d/c SNF.   Expected Discharge Date:                  Expected Discharge Plan:  Skilled Nursing Facility  In-House Referral:     Discharge planning Services  CM Consult  Post Acute Care Choice:    Choice offered to:  Patient  DME Arranged:    DME Agency:     HH Arranged:    HH Agency:     Status of Service:  In process, will continue to follow  Medicare Important Message Given:    Date Medicare IM Given:    Medicare IM give by:    Date Additional Medicare IM Given:    Additional Medicare Important Message give by:     If discussed at Long Length of Stay Meetings, dates discussed:    Additional Comments:  Diane Cook, Diane Krontz, RN 06/12/2016, 1:31 PM

## 2016-06-12 NOTE — Clinical Social Work Placement (Signed)
Patient is set to discharge to [facility] today. Patient & husband at bedside, Jonny RuizJohn & daughter, Coy SaunasRosemary via phone made aware. Discharge packet given to RN, Boneta LucksJenny. [facility] called for transport to pickup at 4:30pm to give SNF time to arrange for bariatric bed.     Lincoln MaxinKelly Anjelica Gorniak, [name] [facility] Clinical Social Worker cell #: [phone]    CLINICAL SOCIAL WORK PLACEMENT  NOTE  Date:  06/12/2016  Patient Details  Name: Diane Cook MRN: [id number] Date of Birth: 1956/10/04  Clinical Social Work is seeking post-discharge placement for this patient at the Skilled  Nursing Facility level of care (*CSW will initial, date and re-position this form in  chart as items are completed):  Yes   Patient/family provided with [facility] Clinical Social Work Department's list of facilities offering this level of care within the geographic area requested by the patient (or if unable, by the patient's family).  Yes   Patient/family informed of their freedom to choose among providers that offer the needed level of care, that participate in Medicare, Medicaid or managed care program needed by the patient, have an available bed and are willing to accept the patient.  Yes   Patient/family informed of [facility]'s ownership interest in [facility] and [facility], as well as of the fact that they are under no obligation to receive care at these facilities.  PASRR submitted to EDS on 06/12/16     PASRR number received on 06/12/16     Existing PASRR number confirmed on       FL2 transmitted to all facilities in geographic area requested by pt/family on 06/12/16     FL2 transmitted to all facilities within larger geographic area on 06/12/16     Patient informed that his/her managed care company has contracts with or will negotiate with certain facilities, including the following:        Yes   Patient/family informed of bed offers received.  [facility]     Physician recommends and patient chooses bed at      [facility] 06/12/16.  Patient to be transferred to facility by [facility]     Patient family notified on 06/12/16 of transfer.  Name of family member notified:  patient's husband at bedside     PHYSICIAN       Additional Comment:    _______________________________________________ [name], Sylus Stgermain F, [name] 06/12/2016, 3:50 PM

## 2016-06-12 NOTE — Clinical Social Work Note (Signed)
Clinical Social Work Assessment  Patient Details  Name: Diane Cook MRN: [id number] Date of Birth: 1956-08-18  Date of referral:  06/12/16               Reason for consult:  Facility Placement                Permission sought to share information with:  [facility] Contact Representative Permission granted to share information::  Yes, Verbal Permission Granted  Name::        Agency::     Relationship::     Contact Information:     Housing/Transportation Living arrangements for the past 2 months:  Single Family Home Source of Information:  Patient, Partner Patient Interpreter Needed:  None Criminal Activity/Legal Involvement Pertinent to Current Situation/Hospitalization:  No - Comment as needed Significant Relationships:  Significant Other, Adult Children Lives with:  Significant Other Do you feel safe going back to the place where you live?  No Need for family participation in patient care:  No (Coment)  Care giving concerns:  CSW received consult from RN, Diane Cook that patient is now agreeable with plan for SNF.    Social Worker assessment / plan:  CSW spoke with patient & significant other, Diane Cook at bedside re: discharge planning.  Employment status:  Retired Health and safety inspectornsurance information:  [place] Recommendations:  Skilled Nursing Facility Information / Referral to community resources:  Skilled Nursing Facility  Patient/Family's Response to care:  Patient is now agreeable and understands that she would have to stay 30 days in order for Medicaid to cover SNF stay.   Patient/Family's Understanding of and Emotional Response to Diagnosis, Current Treatment, and Prognosis:    Emotional Assessment Appearance:  Appears stated age Attitude/Demeanor/Rapport:    Affect (typically observed):    Orientation:  Oriented to Self, Oriented to Place, Oriented to  Time, Oriented to Situation Alcohol / Substance use:    Psych involvement (Current and /or in the community):      Discharge Needs  Concerns to be addressed:    Readmission within the last 30 days:    Current discharge risk:    Barriers to Discharge:      Diane Cook, Diane Monteforte F, [name] 06/12/2016, 11:33 AM

## 2016-06-12 NOTE — Progress Notes (Signed)
PROGRESS NOTE        PATIENT DETAILS Name: Diane Cook Age: 60 y.o. Sex: female Date of Birth: 04/11/1956 Admit Date: 06/06/2016 Admitting Physician Diane Deutscher, MD [id number], Diane Carwin, MD  Brief Narrative: Patient is a 60 y.o. female cirrhosis, Hepatitis C, ongoing alcohol abuse  presenting with lightheadedness and presyncope, found to have hemoglobin of 7.3-felt to be acute blood loss anemia in the setting of upper GI bleeding. Admitted for further evaluation and treatment  Subjective: She was assisted out of bed to chair, thinks overall she is doing better  Assessment/Plan: Principal Problem: Acute upper gastrointestinal bleeding:  -Likely secondary to either variceal/portal gastropathy related bleeding or ulcer related bleeding (NSAID use prior to this admission). Continue PPI and octreotide infusion,  -GI consulted, was scheduled for a endoscopic evaluation on 6/12-but given worsening thrombocytopenia and coronary artery EGD postponed.  -During this hospitalization she has received a total of 5 units of packed red blood cells however hemoglobin not responding appropriately, suspect ongoing slow bleed. -Upper endoscopy performed on 06/10/2016 revealed duodenitis.. This could possibly reflect source of bleeding given elevated INR and thrombocytopenia. GI did not see evidence of esophageal varices. -On 06/12/2016 she remains stable, has not had episodes of bright red blood per rectum or melena.  Active Problems: Acute blood loss anemia:  -Like he secondary to upper GI bleed. She has been transfused 5 units unit of PRBC so far, last hemoglobin transfusion was on 6/12. Hemoglobin is not appropriately increasing after this many units of PRBC transfusion, as noted above -LDH within normal limits at 188, doubt hemolytic anemia. -Haptoglobin less than 10 which could be explained by her history of cirrhosis -She was last transfused with 2 units of packed red  blood cells on 06/11/2016 -Repeat lab work on 06/12/2016 showing hemoglobin 9.1 with hematocrit 26.6.  Acute renal failure: Likely prerenal azotemia- secondary to above, NSAID and diuretic use. Urine sodium 79, and argues against hepatorenal. Volume status much improved-however still has lower extremity edema-continue intravenous Lasix, will continue for 1 more day, before transitioning back to oral diuretic regimen.  Renal ultrasound negative for hydronephrosis.  -On 06/10/2016 creatinine improving, trending down to 1.2 from 2.12 (06/07/2016)  Liver cirrhosis: Likely secondary to alcohol abuse and hepatitis C. MELD score 24.Per gastroenterology-not a liver transplant candidate due to ongoing alcohol abuse.  -Will stop IV ceftriaxone, transition to Ceftin  Thrombocytopenia: Secondary to underlying liver cirrhosis with hypersplenism. -Lab work on 06/12/2016 showing platelet count of 30,000  Coagulopathy:  -Secondary to liver cirrhosis  Hypokalemia. -A.m. labs on 06/12/2016 showing potassium of 3.1 -Will replace with Kdur 40 meq PO x3  Alcohol abuse: Last drink 6/9, no signs of withdrawal. Continue Ativan per protocol. Counseled extensively.  Volume overload: Likely secondary to liver cirrhosis and hypoalbuminemia, check daily weights, improving with intravenous furosemide. Negative balance of 3.1 L so far, I suspect weights are unreliable at this time.   Chronic hepatitis C: Refer to infectious disease clinic as outpatient.  Prominent intra-hepatic biliary ducts: Seen on CT scan abdomen in June 2016-gastroenterology planning on MRCP at some point. Her LFTs are elevated-this is likely secondary to liver cirrhosis and alcohol abuse rather than this issue.  Jaundice/elevated liver enzymes: Suspect secondary to alcoholic liver cirrhosis.  Deconditioning: Was extremely weak even prior to this admission-apparently was walking with the help of a walker, she  was not very mobile because of  weakness and anemia for 1 week prior to this admission. With her acute illness, she has become even more deconditioned, PT evaluation completed, patient agreeable for SNF  DVT Prophylaxis: SCD's  Code Status: Full code  Family Communication: I spoke with her spouse at bedside  Disposition Plan: Case discussed with social work, plan to discharge to skilled nursing facility when bed available  Antimicrobial agents: IV Rocephin 6/10>>6/15  Procedures: None  CONSULTS:  GI  Time spent: 25 minutes-Greater than 50% of this time was spent in counseling, explanation of diagnosis, planning of further management, and coordination of care.  MEDICATIONS: Anti-infectives    Start     Dose/Rate Route Frequency Ordered Stop   06/11/16 1700  cefUROXime (CEFTIN) tablet 500 mg     500 mg Oral 2 times daily with meals 06/11/16 1458     06/11/16 0600  cefTRIAXone (ROCEPHIN) 2 g in dextrose 5 % 50 mL IVPB  Status:  Discontinued     2 g 100 mL/hr over 30 Minutes Intravenous Daily 06/10/16 0756 06/11/16 1308   06/06/16 0415  cefTRIAXone (ROCEPHIN) 1 g in dextrose 5 % 50 mL IVPB  Status:  Discontinued     1 g 100 mL/hr over 30 Minutes Intravenous Daily 06/06/16 0352 06/10/16 0756      Scheduled Meds: . cefUROXime  500 mg Oral BID WC  . fluticasone  2 spray Each Nare Daily  . folic acid  1 mg Intravenous Daily  . furosemide  40 mg Intravenous Q12H  . lactulose  20 g Oral BID  . pantoprazole  40 mg Intravenous Q12H  . potassium chloride  40 mEq Oral Q4H  . sodium chloride flush  3 mL Intravenous Q12H  . sodium chloride flush  3 mL Intravenous Q12H  . spironolactone  50 mg Oral Daily  . thiamine  100 mg Intravenous Daily   Continuous Infusions: . octreotide  (SANDOSTATIN)    IV infusion 50 mcg/hr (06/12/16 1249)   PRN Meds:.sodium chloride, albuterol, ammonium lactate, hydrALAZINE, LORazepam, ondansetron **OR** ondansetron (ZOFRAN) IV, oxyCODONE, sodium chloride flush   PHYSICAL  EXAM: Vital signs: Filed Vitals:   06/11/16 1349 06/11/16 2122 06/12/16 0500 06/12/16 0542  BP: 181/79 158/85 174/74   Pulse: 69 80 75   Temp: 97.8 F (36.6 C) 98.4 F (36.9 C) 98.3 F (36.8 C)   TempSrc: Oral Oral Oral   Resp: 20 20 18    Height:      Weight:    349.8 kg (771 lb 2.7 oz)  SpO2: 99% 97% 95%    Filed Weights   06/10/16 0500 06/10/16 1217 06/12/16 0542  Weight: 151.501 kg (334 lb) 151.501 kg (334 lb) 349.8 kg (771 lb 2.7 oz)   Body mass index is 107.6 kg/(m^2).   Gen Exam: Awake and alert with clear speech. Not in any distress  Neck: Supple, No JVD.   Chest: B/L Clear.   CVS: S1 S2 Regular, no murmurs.  Abdomen: soft, BS +, non tender, non distended. Obese abdomen Extremities: + edema, lower extremities warm to touch. Neurologic: Non Focal.   Skin: No Rash or lesions   Wounds: N/A.   LABORATORY DATA: CBC:  Recent Labs Lab 06/06/16 0050  06/08/16 1652 06/09/16 0511 06/10/16 0514 06/11/16 0450 06/12/16 0444  WBC 5.0  < > 3.6* 3.7* 3.9* 4.2 4.2  NEUTROABS 2.1  --   --   --   --   --   --  HGB 7.3*  < > 7.9* 8.2* 8.0* 7.8* 9.1*  HCT 21.0*  < > 22.7* 23.4* 23.2* 22.9* 26.6*  MCV 108.2*  < > 104.6* 103.1* 104.0* 105.5* 102.3*  PLT 54*  < > 43* 35* 37* 36* 38*  < > = values in this interval not displayed.  Basic Metabolic Panel:  Recent Labs Lab 06/08/16 0500 06/09/16 0511 06/10/16 0514 06/11/16 0450 06/12/16 0444  NA 139 140 142 140 140  K 3.6 3.2* 3.2* 3.1* 3.1*  CL 111 109 106 104 103  CO2 23 25 29 29 30   GLUCOSE 84 93 100* 90 99  BUN 25* 23* 18 18 15   CREATININE 1.76* 1.55* 1.21* 1.22* 1.09*  CALCIUM 8.3* 8.6* 9.1 9.1 8.9  MG 1.6*  --   --   --   --     GFR: Estimated Creatinine Clearance: 154.1 mL/min (by C-G formula based on Cr of 1.09).  Liver Function Tests:  Recent Labs Lab 06/06/16 0930 06/07/16 0334 06/08/16 0500 06/09/16 0511 06/10/16 0514  AST 64* 64* 56* 53* 49*  ALT 29 28 25 23 23   ALKPHOS 104 92 74 66 61   BILITOT 5.0* 6.5* 6.1* 7.8* 9.4*  PROT 6.8 7.4 7.1 7.3 7.6  ALBUMIN 1.6* 2.1* 2.7* 3.0* 3.6   No results for input(s): LIPASE, AMYLASE in the last 168 hours.  Recent Labs Lab 06/06/16 0050  AMMONIA 86*    Coagulation Profile:  Recent Labs Lab 06/06/16 0050 06/07/16 0334 06/08/16 0500 06/09/16 0511 06/10/16 0514  INR 2.27* 2.14* 2.19* 2.29* 2.51*    Cardiac Enzymes:  Recent Labs Lab 06/06/16 0050  TROPONINI <0.03    BNP (last 3 results) No results for input(s): PROBNP in the last 8760 hours.  HbA1C: No results for input(s): HGBA1C in the last 72 hours.  CBG:  Recent Labs Lab 06/06/16 0751 06/08/16 0802 06/10/16 0731 06/11/16 0808 06/12/16 0739  GLUCAP 95 79 99 102* 92    Lipid Profile: No results for input(s): CHOL, HDL, LDLCALC, TRIG, CHOLHDL, LDLDIRECT in the last 72 hours.  Thyroid Function Tests: No results for input(s): TSH, T4TOTAL, FREET4, T3FREE, THYROIDAB in the last 72 hours.  Anemia Panel:  Recent Labs  06/09/16 1309  RETICCTPCT 3.8*    Urine analysis:    Component Value Date/Time   COLORURINE ORANGE* 06/06/2016 0145   APPEARANCEUR CLOUDY* 06/06/2016 0145   LABSPEC 1.023 06/06/2016 0145   PHURINE 5.5 06/06/2016 0145   GLUCOSEU NEGATIVE 06/06/2016 0145   HGBUR TRACE* 06/06/2016 0145   BILIRUBINUR LARGE* 06/06/2016 0145   KETONESUR NEGATIVE 06/06/2016 0145   PROTEINUR NEGATIVE 06/06/2016 0145   UROBILINOGEN 1.0 04/25/2015 1355   NITRITE POSITIVE* 06/06/2016 0145   LEUKOCYTESUR SMALL* 06/06/2016 0145    Sepsis Labs: Lactic Acid, Venous    Component Value Date/Time   LATICACIDVEN 2.16* 06/06/2016 0101    MICROBIOLOGY: Recent Results (from the past 240 hour(s))  MRSA PCR Screening     Status: None   Collection Time: 06/06/16  4:04 AM  Result Value Ref Range Status   MRSA by PCR NEGATIVE NEGATIVE Final    Comment:        The GeneXpert MRSA Assay (FDA approved for NASAL specimens only), is one component of  a comprehensive MRSA colonization surveillance program. It is not intended to diagnose MRSA infection nor to guide or monitor treatment for MRSA infections.   Urine culture     Status: Abnormal   Collection Time: 06/06/16  5:20 PM  Result Value  Ref Range Status   Specimen Description URINE, CLEAN CATCH  Final   Special Requests NONE  Final   Culture MULTIPLE SPECIES PRESENT, SUGGEST RECOLLECTION (A)  Final   Report Status 06/08/2016 FINAL  Final    RADIOLOGY STUDIES/RESULTS: US Renal  06/07/2016  CLINICAL DATA:  Acute renal failure. EXAM: RENAL / URINARY TRACT ULTRASOUND COMPLETE COMPARISON:  None. FINDINGS: Right Kidney: Length: 14.1 cm. Echogenicity within normal limits. No mass or hydronephrosis visualized. Left Kidney: Length: 12.8 cm. Minimal pelviectasis without overt hydronephrosis. Normal echogenicity. Bladder: Not visualized. Degraded exam secondary to patient body habitus. IMPRESSION: 1. Minimal left-sided pelviectasis, without overt hydronephrosis. 2. Lack of visualization of the urinary bladder. Decreased sensitivity and specificity exam due to technique related factors, as described above. Electronically Signed   By: Jeronimo Greaves M.D.   On: 06/07/2016 15:00     LOS: 6 days   Jeralyn Bennett, MD  [facility] Pager:[phone]  If 7PM-7AM, please contact night-coverage [email or web address] Password [facility] 06/12/2016, 12:54 PM

## 2016-06-12 NOTE — [place] FL2 (Signed)
[place] MEDICAID FL2 LEVEL OF CARE SCREENING TOOL     IDENTIFICATION  Patient Name: Diane Cook Birthdate: [birth year] Sex: female Admission Date (Current Location): 06/06/2016  [facility] and [place] Number:  Producer, television/film/video and Address:  [facility],  [address] [place] [address]      Provider Number: [id number]  Attending Physician Name and Address:  Jeralyn Bennett, MD  Relative Name and Phone Number:       Current Level of Care: Hospital Recommended Level of Care: Skilled Nursing Facility Prior Approval Number:    Date Approved/Denied:   PASRR Number: [id number]  Discharge Plan: Home    Current Diagnoses: Patient Active Problem List   Diagnosis Date Noted  . Acute upper gastrointestinal bleeding 06/06/2016  . Symptomatic anemia 06/06/2016  . AKI (acute kidney injury) (HCC) 06/06/2016  . Acute kidney injury (nontraumatic) (HCC)   . Anemia due to blood loss   . Elevated lactic acid level   . Jaundice   . Healthcare maintenance 06/12/2015  . Alcoholic cirrhosis of liver (HCC) 06/12/2015  . Recurrent pain of right knee 05/29/2015  . Hepatitis C 05/14/2015  . Hyperbilirubinemia 05/14/2015  . Tobacco abuse 04/30/2015  . Alcohol abuse 04/24/2015  . Coagulopathy (HCC) 04/24/2015  . Anemia 04/24/2015  . Thrombocytopenia (HCC) 04/24/2015  . Hypomagnesemia 04/24/2015  . Severe obesity (BMI >= 40) (HCC) 04/24/2015  . Pulmonary edema 04/23/2015  . Acute CHF (congestive heart failure) (HCC) 04/23/2015    Orientation RESPIRATION BLADDER Height & Weight     Self, Time, Situation, Place  Normal Continent Weight: (!) 771 lb 2.7 oz (349.8 kg) Height:   (180.3 cm)  BEHAVIORAL SYMPTOMS/MOOD NEUROLOGICAL BOWEL NUTRITION STATUS      Continent Diet (Heart Healthy)  AMBULATORY STATUS COMMUNICATION OF NEEDS Skin   Extensive Assist Verbally Normal                       Personal Care Assistance Level of Assistance  Bathing, Dressing  Bathing Assistance: Limited assistance   Dressing Assistance: Limited assistance     Functional Limitations Info             SPECIAL CARE FACTORS FREQUENCY  PT (By licensed PT), OT (By licensed OT)     PT Frequency: 5 OT Frequency: 5            Contractures      Additional Factors Info  Code Status, Allergies Code Status Info: Fullcode Allergies Info: NKDA           Current Medications (06/12/2016):  This is the current hospital active medication list Current Facility-Administered Medications  Medication Dose Route Frequency Provider Last Rate Last Dose  . 0.9 %  sodium chloride infusion  250 mL Intravenous PRN Briscoe Deutscher, MD 10 mL/hr at 06/10/16 1457 250 mL at 06/10/16 1457  . albuterol (PROVENTIL) (2.5 MG/3ML) 0.083% nebulizer solution 2.5 mg  2.5 mg Nebulization Q6H PRN Lavone Neri Opyd, MD      . ammonium lactate (AMLACTIN) 12 % cream   Topical PRN Lavone Neri Opyd, MD      . cefUROXime (CEFTIN) tablet 500 mg  500 mg Oral BID WC Jeralyn Bennett, MD   500 mg at 06/12/16 0907  . fluticasone (FLONASE) 50 MCG/ACT nasal spray 2 spray  2 spray Each Nare Daily Briscoe Deutscher, MD   2 spray at 06/12/16 0908  . folic acid injection 1 mg  1 mg Intravenous Daily Marcial Pacas  S Opyd, MD   1 mg at 06/12/16 0908  . furosemide (LASIX) injection 40 mg  40 mg Intravenous Q12H Maretta BeesShanker M Ghimire, MD   40 mg at 06/12/16 0907  . hydrALAZINE (APRESOLINE) injection 5 mg  5 mg Intravenous Q4H PRN Leda GauzeKaren J Kirby-Graham, NP   5 mg at 06/12/16 [id number]  . lactulose (CHRONULAC) 10 GM/15ML solution 20 g  20 g Oral BID Briscoe Deutscherimothy S Opyd, MD   20 g at 06/12/16 0907  . LORazepam (ATIVAN) injection 2-3 mg  2-3 mg Intravenous Q1H PRN Lavone Neriimothy S Opyd, MD      . octreotide (SANDOSTATIN) 500 mcg in sodium chloride 0.9 % 250 mL (2 mcg/mL) infusion  50 mcg/hr Intravenous Continuous Briscoe Deutscherimothy S Opyd, MD 25 mL/hr at 06/11/16 2215 50 mcg/hr at 06/11/16 2215  . ondansetron (ZOFRAN) tablet 4 mg  4 mg Oral Q6H PRN Briscoe Deutscherimothy S  Opyd, MD       Or  . ondansetron (ZOFRAN) injection 4 mg  4 mg Intravenous Q6H PRN Briscoe Deutscherimothy S Opyd, MD      . oxyCODONE (Oxy IR/ROXICODONE) immediate release tablet 5 mg  5 mg Oral Q4H PRN Briscoe Deutscherimothy S Opyd, MD   5 mg at 06/07/16 0225  . pantoprazole (PROTONIX) injection 40 mg  40 mg Intravenous Q12H Briscoe Deutscherimothy S Opyd, MD   40 mg at 06/12/16 0908  . potassium chloride SA (K-DUR,KLOR-CON) CR tablet 40 mEq  40 mEq Oral Q4H Jeralyn BennettEzequiel Zamora, MD   40 mEq at 06/12/16 0907  . sodium chloride flush (NS) 0.9 % injection 3 mL  3 mL Intravenous Q12H Briscoe Deutscherimothy S Opyd, MD   3 mL at 06/12/16 0915  . sodium chloride flush (NS) 0.9 % injection 3 mL  3 mL Intravenous Q12H Briscoe Deutscherimothy S Opyd, MD   3 mL at 06/12/16 0915  . sodium chloride flush (NS) 0.9 % injection 3 mL  3 mL Intravenous PRN Briscoe Deutscherimothy S Opyd, MD      . spironolactone (ALDACTONE) tablet 50 mg  50 mg Oral Daily Maretta BeesShanker M Ghimire, MD   50 mg at 06/12/16 0907  . thiamine (B-1) injection 100 mg  100 mg Intravenous Daily Briscoe Deutscherimothy S Opyd, MD   100 mg at 06/12/16 [id number]     Discharge Medications: Please see discharge summary for a list of discharge medications.  Relevant Imaging Results:  Relevant Lab Results:   Additional Information SSN: [id number]  [name], [name], [name]

## 2016-06-12 NOTE — Progress Notes (Signed)
[facility] PROGRESS NOTE Subjective Pt states she feels better.  Objective: Vital signs in last 24 hours: Temp:  [97.8 F (36.6 C)-98.4 F (36.9 C)] 98.3 F (36.8 C) (06/16 0500) Pulse Rate:  [69-80] 75 (06/16 0500) Resp:  [18-20] 18 (06/16 0500) BP: (158-181)/(74-85) 174/74 mmHg (06/16 0500) SpO2:  [95 %-99 %] 95 % (06/16 0500) Weight:  [349.8 kg (771 lb 2.7 oz)] 349.8 kg (771 lb 2.7 oz) (06/16 0542) Last BM Date: 06/10/16  Intake/Output from previous day: 06/15 0701 - 06/16 0700 In: 1850 [P.O.:720; I.V.:500; Blood:630] Out: 4151 [Urine:4150; Stool:1] Intake/Output this shift:      Lab Results:  Recent Labs  06/10/16 0514 06/11/16 0450 06/12/16 0444  WBC 3.9* 4.2 4.2  HGB 8.0* 7.8* 9.1*  HCT 23.2* 22.9* 26.6*  PLT 37* 36* 38*   BMET  Recent Labs  06/10/16 0514 06/11/16 0450 06/12/16 0444  NA 142 140 140  K 3.2* 3.1* 3.1*  CL 106 104 103  CO2 29 29 30   CREATININE 1.21* 1.22* 1.09*   LFT  Recent Labs  06/10/16 0514  PROT 7.6  AST 49*  ALT 23  ALKPHOS 61  BILITOT 9.4*  BILIDIR 3.5*  IBILI 5.9*   PT/INR  Recent Labs  06/10/16 0514  LABPROT 25.9*  INR 2.51*   PANCREAS No results for input(s): LIPASE in the last 72 hours.       Studies/Results: No results found.  Medications: I have reviewed the patient's current medications.  Assessment/Plan: 1. Cirrhosis due to EtOH and Hep C. MELD about 30 expected 3 month mortality from liver failure about 25%. Pt was told that if she continues to drink she will likely die. Very little sign of active inflamation. Would continue to follow LFTs and INR Not a transplant candidate.   Travious Vanover JR,Dejai Schubach L 06/12/2016, 11:50 AM  This note was created using voice recognition software. Minor errors may Have occurred unintentionally.  Pager: [phone] If no answer or after hours call [phone]

## 2016-06-12 NOTE — Progress Notes (Signed)
Physical Therapy Treatment Patient Details Name: Diane AlexandersJeane Ethier MRN: [id number] DOB: [birth year] Today's Date: 06/12/2016    History of Present Illness 60 yo female admitted with acute UGIB. hx of CHF, HTN CVA, ETOH abuse, bipolar d/o, arthritis.     PT Comments    assisetd pt OOB to amb in hallway then positioned in recliner.    Follow Up Recommendations  SNF;Supervision/Assistance - 24 hour     Equipment Recommendations       Recommendations for Other Services       Precautions / Restrictions Precautions Precautions: Fall Restrictions Weight Bearing Restrictions: No    Mobility  Bed Mobility Overal bed mobility: Needs Assistance Bed Mobility: Supine to Sit     Supine to sit: Min assist     General bed mobility comments: increased ability to self perform using rail and increased time  Transfers Overall transfer level: Needs assistance Equipment used: Rolling walker (2 wheeled) Transfers: Sit to/from Stand Sit to Stand: Min guard         General transfer comment: pt able to self rise using forward momentum weigh shift.  Pt able to self sit same fashion with control.  Ambulation/Gait Ambulation/Gait assistance: Supervision;Min guard Ambulation Distance (Feet): 52 Feet Assistive device: Rolling walker (2 wheeled) Gait Pattern/deviations: Step-through pattern Gait velocity: decreased   General Gait Details: pt tolerated increased distance using RW with increased time.  Mod c/o fatigue after activity.  Avg HR 87.  Limited activity tolerance but appears at prior level.   Stairs            Wheelchair Mobility    Modified Rankin (Stroke Patients Only)       Balance                                    Cognition Arousal/Alertness: Awake/alert Behavior During Therapy: WFL for tasks assessed/performed Overall Cognitive Status: Within Functional Limits for tasks assessed                      Exercises      General Comments         Pertinent Vitals/Pain Pain Assessment: No/denies pain    Home Living                      Prior Function            PT Goals (current goals can now be found in the care plan section) Progress towards PT goals: Progressing toward goals    Frequency  Min 3X/week    PT Plan Current plan remains appropriate    Co-evaluation             End of Session Equipment Utilized During Treatment: Gait belt Activity Tolerance: Patient limited by fatigue Patient left: in chair;with call bell/phone within reach;with family/visitor present     Time: [id number] PT Time Calculation (min) (ACUTE ONLY): 25 min  Charges:  $Gait Training: 8-22 mins $Therapeutic Activity: 8-22 mins                    G Codes:      Felecia ShellingLori Mitsue Peery  PTA [facility]  Acute  Rehab Pager      [phone]

## 2016-06-15 ENCOUNTER — Non-Acute Institutional Stay (SKILLED_NURSING_FACILITY): Payer: Medicaid Other | Admitting: Internal Medicine

## 2016-06-15 ENCOUNTER — Encounter (HOSPITAL_COMMUNITY): Payer: Self-pay | Admitting: Gastroenterology

## 2016-06-15 DIAGNOSIS — J309 Allergic rhinitis, unspecified: Secondary | ICD-10-CM

## 2016-06-15 DIAGNOSIS — D696 Thrombocytopenia, unspecified: Secondary | ICD-10-CM

## 2016-06-15 DIAGNOSIS — D689 Coagulation defect, unspecified: Secondary | ICD-10-CM

## 2016-06-15 DIAGNOSIS — B182 Chronic viral hepatitis C: Secondary | ICD-10-CM

## 2016-06-15 DIAGNOSIS — F1010 Alcohol abuse, uncomplicated: Secondary | ICD-10-CM | POA: Diagnosis not present

## 2016-06-15 DIAGNOSIS — K7030 Alcoholic cirrhosis of liver without ascites: Secondary | ICD-10-CM

## 2016-06-15 DIAGNOSIS — D500 Iron deficiency anemia secondary to blood loss (chronic): Secondary | ICD-10-CM

## 2016-06-15 DIAGNOSIS — Z720 Tobacco use: Secondary | ICD-10-CM | POA: Diagnosis not present

## 2016-06-15 DIAGNOSIS — N179 Acute kidney failure, unspecified: Secondary | ICD-10-CM | POA: Diagnosis not present

## 2016-06-15 DIAGNOSIS — E876 Hypokalemia: Secondary | ICD-10-CM | POA: Diagnosis not present

## 2016-06-15 DIAGNOSIS — L853 Xerosis cutis: Secondary | ICD-10-CM | POA: Diagnosis not present

## 2016-06-15 DIAGNOSIS — K922 Gastrointestinal hemorrhage, unspecified: Secondary | ICD-10-CM

## 2016-06-15 NOTE — Progress Notes (Signed)
MRN: [id number] Name: Diane Cook  Sex: female Age: 60 y.o. DOB: September 26, 1956  PSC #:  Facility/Room: [facility] / [address] Level Of Care: SNF Provider: Randon Goldsmith. Lyn Hollingshead, MD Emergency Contacts: Extended Emergency Contact Information Primary Emergency Contact: Hutchinson,John Address: [address]  [place], [place] [address] [place] Home Phone: [phone] Relation: Significant other Secondary Emergency Contact: Jacinto [place]ap [place] Home Phone: [phone] Relation: Daughter  Code Status: Full Code  Allergies: Review of patient's allergies indicates no known allergies.  Chief Complaint  Patient presents with  . New Admit To SNF    Admit to Facility    HPI: Patient is 60 y.o. female with chronic hepatitis C, alcohol abuse, and liver cirrhosis presenting to the emergency department with 1 week of headaches and lightheadedness. Pt was admitted to [facility] from 6/10-16 where she was dx with UGI bleed 2/2 duodenitis, requiring blood transfusions. Htospital course was complicated by  ABLA, ARF, thrombocytopenia, coagulopathy, liver cirrhosis and hypokalemia.. Pt is admitted to SNF with generalized weakness for OT/PT. While at SNF Pt will be followed for ETOH cirrhosis, tx with aldactone, lasix and lactulose, AR, tx with flonase and xerosis, tx with lac-hydrin.  Past Medical History  Diagnosis Date  . Hypertension Dx 2004  . Arthritis Dx 2014  . Stroke [facility]) Dx 1997  . Asthma Dx 2001  . H/O non anemic vitamin B12 deficiency   . Hepatitis C Dx 2011  . Alcoholism (HCC)   . Family history of adverse reaction to anesthesia     sister & daughter have a hard time waking up   . Bipolar disorder (HCC) [id number]    Past Surgical History  Procedure Laterality Date  . Abdominal hysterectomy    . Cholecystectomy    . Esophagogastroduodenoscopy (egd) with propofol N/A 06/10/2016    Procedure: ESOPHAGOGASTRODUODENOSCOPY (EGD) WITH PROPOFOL;  Surgeon:  Carman Ching, MD;  Location: [facility] ENDOSCOPY;  Service: Endoscopy;  Laterality: N/A;      Medication List       This list is accurate as of: 06/15/16  9:16 AM.  Always use your most recent med list.               albuterol 108 (90 Base) MCG/ACT inhaler  Commonly known as:  PROVENTIL HFA;VENTOLIN HFA  Inhale 2 puffs into the lungs every 6 (six) hours as needed for wheezing or shortness of breath.     ammonium lactate 12 % cream  Commonly known as:  LAC-HYDRIN  Apply topically as needed for dry skin.     fluticasone 50 MCG/ACT nasal spray  Commonly known as:  FLONASE  Place 2 sprays into both nostrils daily.     folic acid 1 MG tablet  Commonly known as:  FOLVITE  Take 1 tablet (1 mg total) by mouth daily.     furosemide 40 MG tablet  Commonly known as:  LASIX  Take 1 tablet (40 mg total) by mouth daily.     lactulose 10 GM/15ML solution  Commonly known as:  CHRONULAC  Take 30 mLs (20 g total) by mouth 2 (two) times daily.     MULTIPLE VITAMINS-MINERALS PO  Take 1 tablet by mouth daily.     potassium chloride 10 MEQ tablet  Commonly known as:  K-DUR,KLOR-CON  Take 10 mEq by mouth daily.     spironolactone 50 MG tablet  Commonly known as:  ALDACTONE  Take 1 tablet (  50 mg total) by mouth daily.     thiamine 100 MG tablet  Take 100 mg by mouth daily.        Meds ordered this encounter  Medications  . MULTIPLE VITAMINS-MINERALS PO    Sig: Take 1 tablet by mouth daily.  . potassium chloride (K-DUR,KLOR-CON) 10 MEQ tablet    Sig: Take 10 mEq by mouth daily.  Marland Kitchen. thiamine 100 MG tablet    Sig: Take 100 mg by mouth daily.    Immunization History  Administered Date(s) Administered  . Influenza,inj,Quad PF,36+ Mos 10/15/2015  . Tdap 02/13/2016    Social History  Substance Use Topics  . Smoking status: Former Smoker -- 0.25 packs/day for 40 years    Types: Cigarettes    Quit date: 05/09/2015  . Smokeless tobacco: Never Used  . Alcohol Use: No     Comment:  Patient drinks 6 40oz per day, 1 pint of liquor daily; Last drink 05/09/15    Family history is   Family History  Problem Relation Age of Onset  . Diabetes Mother   . Diabetes Daughter       Review of Systems  DATA OBTAINED: from patient, nurse GENERAL:  no fevers, fatigue, appetite changes SKIN: No itching, rash or wounds EYES: No eye pain, redness, discharge EARS: No earache, tinnitus, change in hearing NOSE: No congestion, drainage or bleeding  MOUTH/THROAT: No mouth or tooth pain, No sore throat RESPIRATORY: No cough, wheezing, SOB CARDIAC: No chest pain, palpitations, lower extremity edema  GI: No abdominal pain, No N/V/D or constipation, No heartburn or reflux  GU: No dysuria, frequency or urgency, or incontinence  MUSCULOSKELETAL: No unrelieved bone/joint pain NEUROLOGIC: No headache, dizziness or focal weakness PSYCHIATRIC: No c/o anxiety or sadness   Filed Vitals:   06/15/16 0905  BP: 174/74  Pulse: 75  Temp: 98.3 F (36.8 C)  Resp: 18    SpO2 Readings from Last 1 Encounters:  06/12/16 95%        Physical Exam  GENERAL APPEARANCE: Alert,  No acute distress.  SKIN: No diaphoresis rash HEAD: Normocephalic, atraumatic  EYES: Conjunctiva/lids clear. Pupils round, reactive. EOMs intact.  EARS: External exam WNL, canals clear. Hearing grossly normal.  NOSE: No deformity or discharge.  MOUTH/THROAT: Lips w/o lesions  RESPIRATORY: Breathing is even, unlabored. Lung sounds are clear   CARDIOVASCULAR: Heart RRR 2/6 murmur RUSB, no rubs or gallops. 1+ peripheral edema.   GASTROINTESTINAL: Abdomen is soft, non-tender, not distended w/ normal bowel sounds.; no ascites GENITOURINARY: Bladder non tender, not distended  MUSCULOSKELETAL: No abnormal joints or musculature NEUROLOGIC:  Cranial nerves 2-12 grossly intact. Moves all extremities  PSYCHIATRIC: Mood and affect slt flat, no behavioral issues  Patient Active Problem List   Diagnosis Date Noted  . Acute  upper gastrointestinal bleeding 06/06/2016  . Symptomatic anemia 06/06/2016  . AKI (acute kidney injury) (HCC) 06/06/2016  . Acute kidney injury (nontraumatic) (HCC)   . Anemia due to blood loss   . Elevated lactic acid level   . Jaundice   . Healthcare maintenance 06/12/2015  . Alcoholic cirrhosis of liver (HCC) 06/12/2015  . Recurrent pain of right knee 05/29/2015  . Hepatitis C 05/14/2015  . Hyperbilirubinemia 05/14/2015  . Tobacco abuse 04/30/2015  . Alcohol abuse 04/24/2015  . Coagulopathy (HCC) 04/24/2015  . Anemia 04/24/2015  . Thrombocytopenia (HCC) 04/24/2015  . Hypomagnesemia 04/24/2015  . Severe obesity (BMI >= 40) (HCC) 04/24/2015  . Pulmonary edema 04/23/2015  . Acute CHF (congestive heart  failure) (HCC) 04/23/2015       Component Value Date/Time   WBC 4.2 06/12/2016 0444   RBC 2.60* 06/12/2016 0444   RBC 2.30* 06/09/2016 1309   HGB 9.1* 06/12/2016 0444   HCT 26.6* 06/12/2016 0444   PLT 38* 06/12/2016 0444   MCV 102.3* 06/12/2016 0444   LYMPHSABS 2.0 06/06/2016 0050   MONOABS 0.5 06/06/2016 0050   EOSABS 0.3 06/06/2016 0050   BASOSABS 0.0 06/06/2016 0050        Component Value Date/Time   NA 140 06/12/2016 0444   K 3.1* 06/12/2016 0444   CL 103 06/12/2016 0444   CO2 30 06/12/2016 0444   GLUCOSE 99 06/12/2016 0444   BUN 15 06/12/2016 0444   CREATININE 1.09* 06/12/2016 0444   CREATININE 0.90 02/13/2016 1002   CALCIUM 8.9 06/12/2016 0444   PROT 7.6 06/10/2016 0514   ALBUMIN 3.6 06/10/2016 0514   AST 49* 06/10/2016 0514   ALT 23 06/10/2016 0514   ALKPHOS 61 06/10/2016 0514   BILITOT 9.4* 06/10/2016 0514   GFRNONAA 53* 06/12/2016 0444   GFRNONAA 69 02/13/2016 1002   GFRAA >60 06/12/2016 0444   GFRAA 80 02/13/2016 1002    Lab Results  Component Value Date   HGBA1C 4.10 02/13/2016    No results found for: CHOL, HDL, LDLCALC, LDLDIRECT, TRIG, CHOLHDL   US Renal  06/07/2016  CLINICAL DATA:  Acute renal failure. EXAM: RENAL / URINARY TRACT  ULTRASOUND COMPLETE COMPARISON:  None. FINDINGS: Right Kidney: Length: 14.1 cm. Echogenicity within normal limits. No mass or hydronephrosis visualized. Left Kidney: Length: 12.8 cm. Minimal pelviectasis without overt hydronephrosis. Normal echogenicity. Bladder: Not visualized. Degraded exam secondary to patient body habitus. IMPRESSION: 1. Minimal left-sided pelviectasis, without overt hydronephrosis. 2. Lack of visualization of the urinary bladder. Decreased sensitivity and specificity exam due to technique related factors, as described above. Electronically Signed   By: Jeronimo Greaves M.D.   On: 06/07/2016 15:00    Not all labs, radiology exams or other studies done during hospitalization come through on my EPIC note; however they are reviewed by me.    Assessment and Plan  Acute upper gastrointestinal bleeding:  -Likely secondary to either variceal/portal gastropathy related bleeding or ulcer related bleeding (NSAID use prior to this admission). Continue PPI and octreotide infusion,  -GI consulted, was scheduled for a endoscopic evaluation on 6/12-but given worsening thrombocytopenia and coronary artery disease EGD postponed.  -During this hospitalization she has received a total of 5 units of packed red blood cells however hemoglobin not responding appropriately, suspect ongoing slow bleed. -Upper endoscopy performed on 06/10/2016 revealed duodenitis.. This could possibly reflect source of bleeding given elevated INR and thrombocytopenia. GI did not see evidence of esophageal varices. SNF - admitted for OT/PT; will follow CBC's  Acute blood loss anemia:  -Like he secondary to upper GI bleed. During this hospitalization she underwent multiple blood transfusions -LDH within normal limits at 188, doubt hemolytic anemia. -Haptoglobin less than 10 which could be explained by her history of cirrhosis -She underwent EGD on 06/10/2016 that revealed duodenitis. GI felt that this probably was a  source of her bleeding given elevated INR and thrombocytopenia. She was not found to have esophageal varices on this study. -She was last transfused with 2 units of packed red blood cells on 06/11/2016 -d/c hemoglobin 9.1 with hematocrit 26.6. SNF - will f/u CBC  Acute renal failure: Likely prerenal azotemia- secondary to above, NSAID and diuretic use. Urine sodium 79, and argues against hepatorenal.  Volume status much improved-however still has lower extremity edema-continue intravenous Lasix, will continue for 1 more day, before transitioning back to oral diuretic regimen. Renal ultrasound negative for hydronephrosis.  -On 06/10/2016 creatinine improving, trending down to 1.2 from 2.12 (06/07/2016) SNF - will f/u BMP  Liver cirrhosis: Likely secondary to alcohol abuse and hepatitis C. MELD score 24.Per gastroenterology-not a liver transplant candidate due to ongoing alcohol abuse. SNF - cont aldactone and lasix and new med, lactulose   Thrombocytopenia: Secondary to underlying liver cirrhosis with hypersplenism. -Lab work on 06/12/2016 showing platelet count of 30,000 SNF - will f/u CBC  Coagulopathy:  -Secondary to liver cirrhosis  Hypokalemia. -A.m. labs on 06/12/2016 showing potassium of 3.1 -Will replace with Kdur 40 meq PO x3 SNF - cont K+ 10 meq daily and f/u BMP  Alcohol abuse: Last drink 6/9, no signs of withdrawal. Continue Ativan per protocol. Counseled extensively.  Volume overload: Likely secondary to liver cirrhosis and hypoalbuminemia SNF - cont spironolactone 50 mg by mouth daily and Lasix 40 mg by mouth daily; will follow weights  Deconditioning: Was extremely weak even prior to this admission-apparently was walking with the help of a walker, she was not very mobile because of weakness and anemia for 1 week prior to this admission. With her acute illness, she has become even more deconditioned SNF - admitted OT/PT  [facility] SNF - cont flonase nasal  spray  [facility] SNF- cont lac-hydrin     Time spent > 45 min;> 50% of time with patient was spent reviewing records, labs, tests and studies, counseling and developing plan of care  Thurston Hole D. Lyn Hollingshead, MD

## 2016-06-16 ENCOUNTER — Encounter: Payer: Self-pay | Admitting: Internal Medicine

## 2016-06-16 DIAGNOSIS — J309 Allergic rhinitis, unspecified: Secondary | ICD-10-CM | POA: Insufficient documentation

## 2016-06-16 DIAGNOSIS — L853 Xerosis cutis: Secondary | ICD-10-CM | POA: Insufficient documentation

## 2016-06-16 DIAGNOSIS — E876 Hypokalemia: Secondary | ICD-10-CM | POA: Insufficient documentation

## 2016-07-02 ENCOUNTER — Non-Acute Institutional Stay (SKILLED_NURSING_FACILITY): Payer: Medicaid Other | Admitting: Adult Health

## 2016-07-02 DIAGNOSIS — K7030 Alcoholic cirrhosis of liver without ascites: Secondary | ICD-10-CM | POA: Diagnosis not present

## 2016-07-02 DIAGNOSIS — D500 Iron deficiency anemia secondary to blood loss (chronic): Secondary | ICD-10-CM | POA: Diagnosis not present

## 2016-07-02 DIAGNOSIS — I10 Essential (primary) hypertension: Secondary | ICD-10-CM

## 2016-07-02 DIAGNOSIS — N179 Acute kidney failure, unspecified: Secondary | ICD-10-CM

## 2016-07-08 ENCOUNTER — Telehealth: Payer: Self-pay | Admitting: Family Medicine

## 2016-07-08 NOTE — Telephone Encounter (Signed)
Contacted Tara from [facility]. Delice Bisonara stated that patient will be getting medication skills from them and there was delay because she was at skilled facility at [facility]. Delice Bisonara stated they will be going out tomorrow or the next to make sure patient has her medication situated. Will inform Dr. Armen PickupFunches

## 2016-07-08 NOTE — Telephone Encounter (Signed)
Delice Bisonara from [facility] called to inform that patient will be having skilled nursing with them. Please follow up.

## 2016-07-09 NOTE — Telephone Encounter (Signed)
Noted  

## 2016-07-14 ENCOUNTER — Telehealth: Payer: Self-pay | Admitting: Family Medicine

## 2016-07-14 NOTE — Telephone Encounter (Signed)
Nurse Lyla Sonarrie from [facility] called states pt is out of vitamin thiamine 100 MG tablet,MULTIPLE VITAMINS-MINERALS PO,folic acid (FOLVITE) 1 MG tablet and was told to buy over the counter

## 2016-07-17 ENCOUNTER — Ambulatory Visit: Payer: Medicaid Other | Attending: Family Medicine | Admitting: Family Medicine

## 2016-07-17 ENCOUNTER — Encounter: Payer: Self-pay | Admitting: Family Medicine

## 2016-07-17 VITALS — BP 107/69 | HR 78 | Temp 97.7°F | Ht 71.0 in | Wt 328.8 lb

## 2016-07-17 DIAGNOSIS — D696 Thrombocytopenia, unspecified: Secondary | ICD-10-CM

## 2016-07-17 DIAGNOSIS — K7030 Alcoholic cirrhosis of liver without ascites: Secondary | ICD-10-CM

## 2016-07-17 DIAGNOSIS — K922 Gastrointestinal hemorrhage, unspecified: Secondary | ICD-10-CM | POA: Diagnosis not present

## 2016-07-17 DIAGNOSIS — I10 Essential (primary) hypertension: Secondary | ICD-10-CM | POA: Diagnosis not present

## 2016-07-17 DIAGNOSIS — D649 Anemia, unspecified: Secondary | ICD-10-CM | POA: Diagnosis not present

## 2016-07-17 DIAGNOSIS — Z9849 Cataract extraction status, unspecified eye: Secondary | ICD-10-CM

## 2016-07-17 DIAGNOSIS — E876 Hypokalemia: Secondary | ICD-10-CM | POA: Diagnosis not present

## 2016-07-17 DIAGNOSIS — F1010 Alcohol abuse, uncomplicated: Secondary | ICD-10-CM

## 2016-07-17 LAB — HEMOCCULT GUIAC POC 1CARD (OFFICE): Fecal Occult Blood, POC: POSITIVE — AB

## 2016-07-17 MED ORDER — THIAMINE HCL 100 MG PO TABS: 100.0000 mg | ORAL_TABLET | Freq: Every day | ORAL | Status: AC

## 2016-07-17 MED ORDER — LACTULOSE 10 GM/15ML PO SOLN: 20.0000 g | Freq: Two times a day (BID) | ORAL | Status: AC

## 2016-07-17 MED ORDER — SPIRONOLACTONE 50 MG PO TABS: 50.0000 mg | ORAL_TABLET | Freq: Every day | ORAL | Status: AC

## 2016-07-17 MED ORDER — FOLIC ACID 1 MG PO TABS: 1.0000 mg | ORAL_TABLET | Freq: Every day | ORAL | Status: AC

## 2016-07-17 MED ORDER — ALBUTEROL SULFATE HFA 108 (90 BASE) MCG/ACT IN AERS: 2.0000 | INHALATION_SPRAY | Freq: Four times a day (QID) | RESPIRATORY_TRACT | Status: AC | PRN

## 2016-07-17 MED ORDER — THERA VITAL M PO TABS: 1.0000 | ORAL_TABLET | Freq: Every day | ORAL | Status: AC

## 2016-07-17 MED ORDER — FUROSEMIDE 40 MG PO TABS: 40.0000 mg | ORAL_TABLET | Freq: Every day | ORAL | Status: AC

## 2016-07-17 MED FILL — PROAIR HFA 90 MCG INHALER: 108 (90 BAS | 28 days supply | Qty: 9 | Fill #0

## 2016-07-17 MED FILL — LACTULOSE 10 GM/15 ML SOLN: 10 | 4 days supply | Qty: 240 | Fill #0

## 2016-07-17 MED FILL — FOLIC ACID 1 MG TABLET: 1 | 30 days supply | Qty: 30 | Fill #0

## 2016-07-17 NOTE — Progress Notes (Signed)
Subjective:  Patient ID: Diane Cook, female    DOB: May 25, 1956  Age: 60 y.o. MRN: [id number]  CC: Follow-up   HPI Jayley Hustead has HTN, hx of ETOH abuse, cirrhosis, obesity she presents for   1. HFU and SNF f/u Upper GI bleed:  She was hospitalized from 6/10-6/16/17 for UGI bleed. She received 5 U of PRBC. She has EGD that revealed duodenitis. She was discharged to SNF. She stay in nursing home from 06/12/16-06/28/16 for rehab. She request an ordered for PT and OT. She has quit drinking. She denies abdominal pain and leg swelling. No blood in stool. She still has some dizziness and lightheadedness. She missed her HFU with GI stating she was not aware of the appointment.   Social History  Substance Use Topics  . Smoking status: Former Smoker -- 0.25 packs/day for 40 years    Types: Cigarettes    Quit date: 05/09/2015  . Smokeless tobacco: Never Used  . Alcohol Use: No     Comment: Patient drinks 6 40oz per day, 1 pint of liquor daily; Last drink 05/09/15   Outpatient Prescriptions Prior to Visit  Medication Sig Dispense Refill  . albuterol (PROVENTIL HFA;VENTOLIN HFA) 108 (90 Base) MCG/ACT inhaler Inhale 2 puffs into the lungs every 6 (six) hours as needed for wheezing or shortness of breath. 1 each 1  . ammonium lactate (LAC-HYDRIN) 12 % cream Apply topically as needed for dry skin. 385 g 0  . fluticasone (FLONASE) 50 MCG/ACT nasal spray Place 2 sprays into both nostrils daily. 16 g 1  . folic acid (FOLVITE) 1 MG tablet Take 1 tablet (1 mg total) by mouth daily. 30 tablet 5  . furosemide (LASIX) 40 MG tablet Take 1 tablet (40 mg total) by mouth daily. 30 tablet 0  . lactulose (CHRONULAC) 10 GM/15ML solution Take 30 mLs (20 g total) by mouth 2 (two) times daily. 240 mL 0  . MULTIPLE VITAMINS-MINERALS PO Take 1 tablet by mouth daily.    . potassium chloride (K-DUR,KLOR-CON) 10 MEQ tablet Take 10 mEq by mouth daily.    Marland Kitchen spironolactone (ALDACTONE) 50 MG tablet Take 1 tablet (50 mg total)  by mouth daily. 30 tablet 3  . thiamine 100 MG tablet Take 100 mg by mouth daily.     No facility-administered medications prior to visit.    ROS Review of Systems  Constitutional: Negative for fever and chills.  Eyes: Positive for visual disturbance (request referral to eye doctor ).  Respiratory: Negative for shortness of breath.   Cardiovascular: Negative for chest pain.  Gastrointestinal: Negative for abdominal pain and blood in stool.  Musculoskeletal: Negative for back pain and arthralgias.  Skin: Negative for rash.  Allergic/Immunologic: Negative for immunocompromised state.  Neurological: Positive for dizziness and light-headedness.  Hematological: Negative for adenopathy. Does not bruise/bleed easily.  Psychiatric/Behavioral: Negative for suicidal ideas and dysphoric mood.    Objective:  BP 107/69 mmHg  Pulse 78  Temp(Src) 97.7 F (36.5 C) (Oral)  Ht  (1.803 m)  Wt 328 lb 12.8 oz (149.143 kg)  BMI 45.88 kg/m2  SpO2 97%  BP/Weight 07/17/2016 06/15/2016 06/12/2016  Systolic BP 107 174 157  Diastolic BP 69 74 64  Wt. (Lbs) 328.8 334 349.8  BMI 45.88 46.6 48.81   Physical Exam  Constitutional: She is oriented to person, place, and time. She appears well-developed and well-nourished. No distress.  Obese   HENT:  Head: Normocephalic and atraumatic.  Cardiovascular: Normal rate, regular rhythm, normal heart  sounds and intact distal pulses.   Pulmonary/Chest: Effort normal and breath sounds normal.  Genitourinary: Guaiac positive stool.  Musculoskeletal: She exhibits no edema.  Neurological: She is alert and oriented to person, place, and time.  Skin: Skin is warm and dry. No rash noted.  Psychiatric: She has a normal mood and affect.    Assessment & Plan:  Erling CruzJeane was seen today for follow-up.  Diagnoses and all orders for this visit:  Acute upper gastrointestinal bleeding -     CBC -     Cancel: Fecal Occult Blood, Guaiac -     Hemoccult - 1 Card  (office)  Symptomatic anemia -     Ambulatory referral to Physical Therapy -     Ambulatory referral to Occupational Therapy  Hypokalemia -     BASIC METABOLIC PANEL WITH GFR -     Magnesium  Essential hypertension -     BASIC METABOLIC PANEL WITH GFR -     furosemide (LASIX) 40 MG tablet; Take 1 tablet (40 mg total) by mouth daily. -     spironolactone (ALDACTONE) 50 MG tablet; Take 1 tablet (50 mg total) by mouth daily.  Thrombocytopenia (HCC) -     folic acid (FOLVITE) 1 MG tablet; Take 1 tablet (1 mg total) by mouth daily. -     thiamine 100 MG tablet; Take 1 tablet (100 mg total) by mouth daily. -     Multiple Vitamins-Minerals (MULTIVITAMIN) tablet; Take 1 tablet by mouth daily.  Alcohol abuse  Alcoholic cirrhosis of liver without ascites (HCC) -     furosemide (LASIX) 40 MG tablet; Take 1 tablet (40 mg total) by mouth daily. -     lactulose (CHRONULAC) 10 GM/15ML solution; Take 30 mLs (20 g total) by mouth 2 (two) times daily. -     spironolactone (ALDACTONE) 50 MG tablet; Take 1 tablet (50 mg total) by mouth daily.  History of cataract extraction, unspecified laterality -     Ambulatory referral to Ophthalmology  Other orders -     albuterol (PROVENTIL HFA;VENTOLIN HFA) 108 (90 Base) MCG/ACT inhaler; Inhale 2 puffs into the lungs every 6 (six) hours as needed for wheezing or shortness of breath.     No orders of the defined types were placed in this encounter.    Follow-up: No Follow-up on file.   Dessa PhiJosalyn Alira Fretwell MD

## 2016-07-17 NOTE — Assessment & Plan Note (Signed)
In remission.

## 2016-07-17 NOTE — Assessment & Plan Note (Signed)
Controlled on diuretics Check BMP

## 2016-07-17 NOTE — Assessment & Plan Note (Signed)
Duodenitis causing GI bleed She has quit alcohol. No GI pain  Plan: Rescheduled GI f/u appt  Continue alcohol cessation CBC

## 2016-07-17 NOTE — Patient Instructions (Addendum)
Erling CruzJeane was seen today for follow-up.  Diagnoses and all orders for this visit:  Acute upper gastrointestinal bleeding -     CBC -     Cancel: Fecal Occult Blood, Guaiac -     Hemoccult - 1 Card (office)  Symptomatic anemia -     Ambulatory referral to Physical Therapy -     Ambulatory referral to Occupational Therapy  Hypokalemia -     BASIC METABOLIC PANEL WITH GFR -     Magnesium  Essential hypertension -     BASIC METABOLIC PANEL WITH GFR -     furosemide (LASIX) 40 MG tablet; Take 1 tablet (40 mg total) by mouth daily. -     spironolactone (ALDACTONE) 50 MG tablet; Take 1 tablet (50 mg total) by mouth daily.  Thrombocytopenia (HCC) -     folic acid (FOLVITE) 1 MG tablet; Take 1 tablet (1 mg total) by mouth daily. -     thiamine 100 MG tablet; Take 1 tablet (100 mg total) by mouth daily. -     Multiple Vitamins-Minerals (MULTIVITAMIN) tablet; Take 1 tablet by mouth daily.  Alcohol abuse  Alcoholic cirrhosis of liver without ascites (HCC) -     furosemide (LASIX) 40 MG tablet; Take 1 tablet (40 mg total) by mouth daily. -     lactulose (CHRONULAC) 10 GM/15ML solution; Take 30 mLs (20 g total) by mouth 2 (two) times daily. -     spironolactone (ALDACTONE) 50 MG tablet; Take 1 tablet (50 mg total) by mouth daily.  History of cataract extraction, unspecified laterality -     Ambulatory referral to Ophthalmology  Other orders -     albuterol (PROVENTIL HFA;VENTOLIN HFA) 108 (90 Base) MCG/ACT inhaler; Inhale 2 puffs into the lungs every 6 (six) hours as needed for wheezing or shortness of breath.    You will be called with lab results and any medication adjustments if needed.  Follow up with [facility] for hospital follow up appointment August 2nd at 2:15 PM, arrive at 2 o'clock   Address: [address], [place], [place] [address]  Phone: [phone]   F/u with me in 2 months for BP and lab check   Dr. Armen PickupFunches

## 2016-07-19 NOTE — Progress Notes (Signed)
Location:   [facility]   Place of Service:  SNF (31)    CODE STATUS: full code   No Known Allergies  Chief Complaint  Patient presents with  . Discharge Note    HPI:    She is being discharged to home with home health for RN. She will complete her ot/pt on an outpatient basis. She will need a bariatric rollator; 3:1 commode and tub bench. She will need her prescriptions to be written and will need to follow up with her medical provider.   Past Medical History:  Diagnosis Date  . Alcoholism (HCC)   . Arthritis Dx 2014  . Asthma Dx 2001  . Bipolar disorder (HCC) [id number]  . Family history of adverse reaction to anesthesia    sister & daughter have a hard time waking up   . H/O non anemic vitamin B12 deficiency   . Hepatitis C Dx 2011  . Hypertension Dx 2004  . Stroke [facility]) Dx 1997    Past Surgical History:  Procedure Laterality Date  . ABDOMINAL HYSTERECTOMY    . CHOLECYSTECTOMY    . ESOPHAGOGASTRODUODENOSCOPY (EGD) WITH PROPOFOL N/A 06/10/2016   Procedure: ESOPHAGOGASTRODUODENOSCOPY (EGD) WITH PROPOFOL;  Surgeon: Carman Ching, MD;  Location: [facility];  Service: Endoscopy;  Laterality: N/A;    Social History   Social History  . Marital status: Single    Spouse name: N/A  . Number of children: N/A  . Years of education: N/A   Occupational History  . Not on file.   Social History Main Topics  . Smoking status: Former Smoker    Packs/day: 0.25    Years: 40.00    Types: Cigarettes    Quit date: 05/09/2015  . Smokeless tobacco: Never Used  . Alcohol use No     Comment: Patient drinks 6 40oz per day, 1 pint of liquor daily; Last drink 05/09/15  . Drug use: No  . Sexual activity: Not on file   Other Topics Concern  . Not on file   Social History Narrative  . No narrative on file   Family History  Problem Relation Age of Onset  . Diabetes Mother   . Diabetes Daughter     VITAL SIGNS BP (!) 100/51   Pulse 87   Ht 5\' 7"  (1.702 m)   Wt (!) 334  lb (151.5 kg)   BMI 52.31 kg/m   Patient's Medications  New Prescriptions   No medications on file  Previous Medications   ALBUTEROL (PROVENTIL HFA;VENTOLIN HFA) 108 (90 BASE) MCG/ACT INHALER    Inhale 2 puffs into the lungs every 6 (six) hours as needed for wheezing or shortness of breath.   AMMONIUM LACTATE (LAC-HYDRIN) 12 % CREAM    Apply topically as needed for dry skin.   FLUTICASONE (FLONASE) 50 MCG/ACT NASAL SPRAY    Place 2 sprays into both nostrils daily.   FOLIC ACID (FOLVITE) 1 MG TABLET    Take 1 tablet (1 mg total) by mouth daily.   FUROSEMIDE (LASIX) 40 MG TABLET    Take 1 tablet (40 mg total) by mouth daily.   LACTULOSE (CHRONULAC) 10 GM/15ML SOLUTION    Take 30 mLs (20 g total) by mouth 2 (two) times daily.   MULTIPLE VITAMINS-MINERALS (MULTIVITAMIN) TABLET    Take 1 tablet by mouth daily.   POTASSIUM CHLORIDE (K-DUR,KLOR-CON) 10 MEQ TABLET    Take 10 mEq by mouth daily.   SPIRONOLACTONE (ALDACTONE) 50 MG TABLET    Take 1 tablet (  50 mg total) by mouth daily.   THIAMINE 100 MG TABLET    Take 1 tablet (100 mg total) by mouth daily.  Modified Medications   No medications on file  Discontinued Medications   No medications on file     SIGNIFICANT DIAGNOSTIC EXAMS   Review of Systems  Constitutional: Negative for malaise/fatigue.  Respiratory: Negative for cough and shortness of breath.   Cardiovascular: Negative for chest pain, palpitations and leg swelling.  Gastrointestinal: Negative for abdominal pain, constipation and heartburn.  Musculoskeletal: Negative for back pain, joint pain and myalgias.  Skin: Negative.   Neurological: Negative for dizziness.  Psychiatric/Behavioral: The patient is not nervous/anxious.   All other systems reviewed and are negative.   Physical Exam  Constitutional: No distress.  Obese   Eyes: Conjunctivae are normal.  Neck: Neck supple. No JVD present. No thyromegaly present.  Cardiovascular: Normal rate, regular rhythm and intact  distal pulses.   Murmur heard. Respiratory: Effort normal and breath sounds normal. No respiratory distress. She has no wheezes.  GI: Soft. Bowel sounds are normal. She exhibits no distension. There is no tenderness.  Musculoskeletal: She exhibits no edema.  Able to move all extremities   Lymphadenopathy:    She has no cervical adenopathy.  Neurological: She is alert.  Skin: Skin is warm and dry. She is not diaphoretic.  Psychiatric: She has a normal mood and affect.     ASSESSMENT/ PLAN:   Patient is being discharged with the following home health services:  RN for medication management. Will complete ot/pt on an outpatient basis  Patient is being discharged with the following durable medical equipment:  Will need bariatric rollator in order to allow her to maintain her current level of independence with her alds. Will need a bariatric 3:1 commode and tub bench.   Patient has been advised to f/u with their PCP in 1-2 weeks to bring them up to date on their rehab stay.  Social services at facility was responsible for arranging this appointment.  Pt was provided with a 30 day supply of prescriptions for medications and refills must be obtained from their PCP.  For controlled substances, a more limited supply may be provided adequate until PCP appointment only.   Time spent with patient  40  minutes >50% time spent counseling; reviewing medical record; tests; labs; and developing future plan of care    Synthia Innocent NP [facility]  Contact [phone] Monday through Friday 8am- 5pm  After hours call [phone]

## 2016-07-20 ENCOUNTER — Ambulatory Visit: Payer: Medicaid Other | Attending: Family Medicine

## 2016-07-20 DIAGNOSIS — I10 Essential (primary) hypertension: Secondary | ICD-10-CM | POA: Diagnosis present

## 2016-07-20 DIAGNOSIS — K922 Gastrointestinal hemorrhage, unspecified: Secondary | ICD-10-CM

## 2016-07-20 DIAGNOSIS — D649 Anemia, unspecified: Secondary | ICD-10-CM | POA: Insufficient documentation

## 2016-07-20 LAB — MAGNESIUM: Magnesium: 2 mg/dL (ref 1.5–2.5)

## 2016-07-20 LAB — CBC WITH DIFFERENTIAL/PLATELET
BASOS PCT: 1 %
Basophils Absolute: 57 cells/uL (ref 0–200)
EOS ABS: 342 {cells}/uL (ref 15–500)
Eosinophils Relative: 6 %
HCT: 22.5 % — ABNORMAL LOW (ref 35.0–45.0)
Hemoglobin: 8.2 g/dL — ABNORMAL LOW (ref 11.7–15.5)
LYMPHS PCT: 29 %
Lymphs Abs: 1653 cells/uL (ref 850–3900)
MCH: 37.1 pg — AB (ref 27.0–33.0)
MCHC: 36.4 g/dL — ABNORMAL HIGH (ref 32.0–36.0)
MCV: 101.8 fL — AB (ref 80.0–100.0)
MONOS PCT: 8 %
Monocytes Absolute: 456 cells/uL (ref 200–950)
Neutro Abs: 3192 cells/uL (ref 1500–7800)
Neutrophils Relative %: 56 %
PLATELETS: 72 10*3/uL — AB (ref 140–400)
RBC: 2.21 MIL/uL — ABNORMAL LOW (ref 3.80–5.10)
RDW: 17.7 % — AB (ref 11.0–15.0)
WBC: 5.7 10*3/uL (ref 3.8–10.8)

## 2016-07-20 LAB — BASIC METABOLIC PANEL WITH GFR
BUN: 19 mg/dL (ref 7–25)
CALCIUM: 7.9 mg/dL — AB (ref 8.6–10.4)
CO2: 23 mmol/L (ref 20–31)
CREATININE: 1.15 mg/dL — AB (ref 0.50–0.99)
Chloride: 107 mmol/L (ref 98–110)
GFR, EST AFRICAN AMERICAN: 59 mL/min — AB (ref >=60)
GFR, Est Non African American: 51 mL/min — ABNORMAL LOW (ref >=60)
Glucose, Bld: 97 mg/dL (ref 65–99)
Potassium: 4.6 mmol/L (ref 3.5–5.3)
SODIUM: 134 mmol/L — AB (ref 135–146)

## 2016-07-21 ENCOUNTER — Other Ambulatory Visit: Payer: Self-pay | Admitting: Family Medicine

## 2016-07-21 ENCOUNTER — Telehealth: Payer: Self-pay

## 2016-07-21 MED ORDER — PANTOPRAZOLE SODIUM 40 MG PO TBEC: 40.0000 mg | DELAYED_RELEASE_TABLET | Freq: Every day | ORAL | 1 refills | Status: AC

## 2016-07-21 MED FILL — PANTOPRAZOLE SOD DR 40 MG T: 40 | 30 days supply | Qty: 30 | Fill #0

## 2016-07-21 NOTE — Telephone Encounter (Signed)
Patient was called on 07/21/2016. Patient was informed of lab results.

## 2016-07-30 NOTE — Telephone Encounter (Signed)
[facility] called to let pt. PCP know that pt. Did not show up for two of her appointments that she had scheduled. Pt. Was referred for GI bleed.

## 2016-08-02 ENCOUNTER — Inpatient Hospital Stay (HOSPITAL_COMMUNITY)
Admission: EM | Admit: 2016-08-02 | Discharge: 2016-08-28 | DRG: 377 | Disposition: E | Payer: Medicaid Other | Attending: Emergency Medicine | Admitting: Emergency Medicine

## 2016-08-02 ENCOUNTER — Encounter (HOSPITAL_COMMUNITY): Payer: Self-pay

## 2016-08-02 ENCOUNTER — Emergency Department (HOSPITAL_COMMUNITY): Payer: Medicaid Other

## 2016-08-02 DIAGNOSIS — Z452 Encounter for adjustment and management of vascular access device: Secondary | ICD-10-CM

## 2016-08-02 DIAGNOSIS — Z79899 Other long term (current) drug therapy: Secondary | ICD-10-CM

## 2016-08-02 DIAGNOSIS — K921 Melena: Principal | ICD-10-CM | POA: Diagnosis present

## 2016-08-02 DIAGNOSIS — Z87891 Personal history of nicotine dependence: Secondary | ICD-10-CM

## 2016-08-02 DIAGNOSIS — B182 Chronic viral hepatitis C: Secondary | ICD-10-CM | POA: Diagnosis present

## 2016-08-02 DIAGNOSIS — R571 Hypovolemic shock: Secondary | ICD-10-CM | POA: Diagnosis present

## 2016-08-02 DIAGNOSIS — K920 Hematemesis: Secondary | ICD-10-CM | POA: Diagnosis present

## 2016-08-02 DIAGNOSIS — K922 Gastrointestinal hemorrhage, unspecified: Secondary | ICD-10-CM

## 2016-08-02 DIAGNOSIS — Z515 Encounter for palliative care: Secondary | ICD-10-CM | POA: Diagnosis present

## 2016-08-02 DIAGNOSIS — N179 Acute kidney failure, unspecified: Secondary | ICD-10-CM

## 2016-08-02 DIAGNOSIS — Z6841 Body Mass Index (BMI) 40.0 and over, adult: Secondary | ICD-10-CM

## 2016-08-02 DIAGNOSIS — N170 Acute kidney failure with tubular necrosis: Secondary | ICD-10-CM | POA: Diagnosis not present

## 2016-08-02 DIAGNOSIS — K2980 Duodenitis without bleeding: Secondary | ICD-10-CM | POA: Diagnosis present

## 2016-08-02 DIAGNOSIS — Z90710 Acquired absence of both cervix and uterus: Secondary | ICD-10-CM

## 2016-08-02 DIAGNOSIS — R410 Disorientation, unspecified: Secondary | ICD-10-CM | POA: Diagnosis present

## 2016-08-02 DIAGNOSIS — E872 Acidosis, unspecified: Secondary | ICD-10-CM

## 2016-08-02 DIAGNOSIS — D6959 Other secondary thrombocytopenia: Secondary | ICD-10-CM | POA: Diagnosis present

## 2016-08-02 DIAGNOSIS — D689 Coagulation defect, unspecified: Secondary | ICD-10-CM | POA: Diagnosis present

## 2016-08-02 DIAGNOSIS — F10239 Alcohol dependence with withdrawal, unspecified: Secondary | ICD-10-CM | POA: Diagnosis present

## 2016-08-02 DIAGNOSIS — E875 Hyperkalemia: Secondary | ICD-10-CM

## 2016-08-02 DIAGNOSIS — Z8673 Personal history of transient ischemic attack (TIA), and cerebral infarction without residual deficits: Secondary | ICD-10-CM

## 2016-08-02 DIAGNOSIS — I10 Essential (primary) hypertension: Secondary | ICD-10-CM | POA: Diagnosis present

## 2016-08-02 DIAGNOSIS — E871 Hypo-osmolality and hyponatremia: Secondary | ICD-10-CM | POA: Diagnosis present

## 2016-08-02 DIAGNOSIS — J45909 Unspecified asthma, uncomplicated: Secondary | ICD-10-CM | POA: Diagnosis present

## 2016-08-02 DIAGNOSIS — T82898A Other specified complication of vascular prosthetic devices, implants and grafts, initial encounter: Secondary | ICD-10-CM

## 2016-08-02 DIAGNOSIS — K7290 Hepatic failure, unspecified without coma: Secondary | ICD-10-CM | POA: Diagnosis present

## 2016-08-02 DIAGNOSIS — R0682 Tachypnea, not elsewhere classified: Secondary | ICD-10-CM | POA: Diagnosis not present

## 2016-08-02 DIAGNOSIS — K2970 Gastritis, unspecified, without bleeding: Secondary | ICD-10-CM | POA: Diagnosis present

## 2016-08-02 DIAGNOSIS — R451 Restlessness and agitation: Secondary | ICD-10-CM | POA: Diagnosis present

## 2016-08-02 DIAGNOSIS — K7030 Alcoholic cirrhosis of liver without ascites: Secondary | ICD-10-CM | POA: Diagnosis present

## 2016-08-02 DIAGNOSIS — D649 Anemia, unspecified: Secondary | ICD-10-CM | POA: Diagnosis present

## 2016-08-02 DIAGNOSIS — F319 Bipolar disorder, unspecified: Secondary | ICD-10-CM | POA: Diagnosis present

## 2016-08-02 DIAGNOSIS — G4733 Obstructive sleep apnea (adult) (pediatric): Secondary | ICD-10-CM | POA: Diagnosis present

## 2016-08-02 DIAGNOSIS — R578 Other shock: Secondary | ICD-10-CM

## 2016-08-02 DIAGNOSIS — Z4659 Encounter for fitting and adjustment of other gastrointestinal appliance and device: Secondary | ICD-10-CM

## 2016-08-02 DIAGNOSIS — M1990 Unspecified osteoarthritis, unspecified site: Secondary | ICD-10-CM | POA: Diagnosis present

## 2016-08-02 DIAGNOSIS — E538 Deficiency of other specified B group vitamins: Secondary | ICD-10-CM | POA: Diagnosis present

## 2016-08-02 LAB — COMPREHENSIVE METABOLIC PANEL
ALBUMIN: 1.4 g/dL — AB (ref 3.5–5.0)
ALT: 82 U/L — ABNORMAL HIGH (ref 14–54)
ANION GAP: 10 (ref 5–15)
AST: 250 U/L — AB (ref 15–41)
Alkaline Phosphatase: 113 U/L (ref 38–126)
BUN: 63 mg/dL — AB (ref 6–20)
CHLORIDE: 106 mmol/L (ref 101–111)
CO2: 15 mmol/L — ABNORMAL LOW (ref 22–32)
Calcium: 9 mg/dL (ref 8.9–10.3)
Creatinine, Ser: 3.23 mg/dL — ABNORMAL HIGH (ref 0.44–1.00)
GFR calc Af Amer: 17 mL/min — ABNORMAL LOW (ref >60)
GFR calc non Af Amer: 14 mL/min — ABNORMAL LOW (ref >60)
GLUCOSE: 80 mg/dL (ref 65–99)
POTASSIUM: 6.6 mmol/L — AB (ref 3.5–5.1)
SODIUM: 131 mmol/L — AB (ref 135–145)
Total Bilirubin: 13.9 mg/dL — ABNORMAL HIGH (ref 0.3–1.2)
Total Protein: 6.7 g/dL (ref 6.5–8.1)

## 2016-08-02 LAB — CBC WITH DIFFERENTIAL/PLATELET
BASOS PCT: 1 %
Basophils Absolute: 0.1 10*3/uL (ref 0.0–0.1)
EOS PCT: 3 %
Eosinophils Absolute: 0.3 10*3/uL (ref 0.0–0.7)
HEMATOCRIT: 15.8 % — AB (ref 36.0–46.0)
HEMOGLOBIN: 5.6 g/dL — AB (ref 12.0–15.0)
LYMPHS PCT: 17 %
Lymphs Abs: 2 10*3/uL (ref 0.7–4.0)
MCH: 38.1 pg — ABNORMAL HIGH (ref 26.0–34.0)
MCHC: 35.4 g/dL (ref 30.0–36.0)
MCV: 107.5 fL — AB (ref 78.0–100.0)
Monocytes Absolute: 1 10*3/uL (ref 0.1–1.0)
Monocytes Relative: 9 %
Neutro Abs: 8.1 10*3/uL — ABNORMAL HIGH (ref 1.7–7.7)
Neutrophils Relative %: 70 %
Platelets: 59 10*3/uL — ABNORMAL LOW (ref 150–400)
RBC: 1.47 MIL/uL — ABNORMAL LOW (ref 3.87–5.11)
RDW: 21 % — ABNORMAL HIGH (ref 11.5–15.5)
WBC: 11.5 10*3/uL — ABNORMAL HIGH (ref 4.0–10.5)

## 2016-08-02 LAB — I-STAT CHEM 8, ED
BUN: 69 mg/dL — ABNORMAL HIGH (ref 6–20)
CALCIUM ION: 1.08 mmol/L — AB (ref 1.12–1.23)
Chloride: 107 mmol/L (ref 101–111)
Creatinine, Ser: 3.4 mg/dL — ABNORMAL HIGH (ref 0.44–1.00)
Glucose, Bld: 73 mg/dL (ref 65–99)
HEMATOCRIT: 21 % — AB (ref 36.0–46.0)
HEMOGLOBIN: 7.1 g/dL — AB (ref 12.0–15.0)
Potassium: 7.6 mmol/L (ref 3.5–5.1)
SODIUM: 134 mmol/L — AB (ref 135–145)
TCO2: 18 mmol/L (ref 0–100)

## 2016-08-02 LAB — CBG MONITORING, ED: GLUCOSE-CAPILLARY: 70 mg/dL (ref 65–99)

## 2016-08-02 LAB — PREPARE RBC (CROSSMATCH)

## 2016-08-02 LAB — AMMONIA: AMMONIA: 116 umol/L — AB (ref 9–35)

## 2016-08-02 LAB — I-STAT CG4 LACTIC ACID, ED: Lactic Acid, Venous: 6.81 mmol/L (ref 0.5–1.9)

## 2016-08-02 LAB — PROTIME-INR
INR: 4.64 — AB
PROTHROMBIN TIME: 45.1 s — AB (ref 11.4–15.2)

## 2016-08-02 MED ORDER — SODIUM CHLORIDE 0.9 % IV SOLN
Freq: Once | INTRAVENOUS | Status: AC
  Administered 2016-08-03: 10 mL/h via INTRAVENOUS

## 2016-08-02 MED ORDER — INSULIN ASPART 100 UNIT/ML ~~LOC~~ SOLN
5.0000 [IU] | Freq: Once | SUBCUTANEOUS | Status: AC
  Administered 2016-08-02: 5 [IU] via INTRAVENOUS
  Filled 2016-08-02: qty 1

## 2016-08-02 MED ORDER — ONDANSETRON HCL 4 MG/2ML IJ SOLN
4.0000 mg | Freq: Once | INTRAMUSCULAR | Status: AC
  Administered 2016-08-02: 4 mg via INTRAVENOUS
  Filled 2016-08-02: qty 2

## 2016-08-02 MED ORDER — SODIUM CHLORIDE 0.9 % IV SOLN
8.0000 mg/h | INTRAVENOUS | Status: AC
  Administered 2016-08-02 – 2016-08-05 (×7): 8 mg/h via INTRAVENOUS
  Filled 2016-08-02 (×14): qty 80

## 2016-08-02 MED ORDER — OCTREOTIDE ACETATE 500 MCG/ML IJ SOLN
50.0000 ug/h | INTRAMUSCULAR | Status: DC
  Administered 2016-08-02: 50 ug/h via INTRAVENOUS
  Filled 2016-08-02 (×2): qty 1

## 2016-08-02 MED ORDER — DEXTROSE 50 % IV SOLN
1.0000 | Freq: Once | INTRAVENOUS | Status: AC
  Administered 2016-08-02: 50 mL via INTRAVENOUS
  Filled 2016-08-02: qty 50

## 2016-08-02 MED ORDER — SODIUM CHLORIDE 0.9 % IV SOLN
1.0000 g | Freq: Once | INTRAVENOUS | Status: AC
  Administered 2016-08-02: 1 g via INTRAVENOUS
  Filled 2016-08-02: qty 10

## 2016-08-02 MED ORDER — SODIUM CHLORIDE 0.9 % IV SOLN
80.0000 mg | Freq: Once | INTRAVENOUS | Status: AC
  Administered 2016-08-02: 80 mg via INTRAVENOUS
  Filled 2016-08-02: qty 80

## 2016-08-02 MED ORDER — DEXTROSE 5 % IV SOLN
1.0000 g | Freq: Once | INTRAVENOUS | Status: AC
  Administered 2016-08-02: 1 g via INTRAVENOUS
  Filled 2016-08-02: qty 10

## 2016-08-02 MED ORDER — OCTREOTIDE LOAD VIA INFUSION
50.0000 ug | Freq: Once | INTRAVENOUS | Status: AC
  Administered 2016-08-02: 50 ug via INTRAVENOUS
  Filled 2016-08-02: qty 25

## 2016-08-02 NOTE — ED Notes (Signed)
Bed: RESB Expected date:  Expected time:  Means of arrival:  Comments: EMS abd pain / N/V hypotension

## 2016-08-02 NOTE — ED Triage Notes (Signed)
Patient from home c/o right sided abdominal pain lower quadrant x3 days.  Per EMS patient vomited x3 today and had blood in emesis.  [facility]

## 2016-08-03 ENCOUNTER — Inpatient Hospital Stay (HOSPITAL_COMMUNITY): Payer: Medicaid Other

## 2016-08-03 DIAGNOSIS — F319 Bipolar disorder, unspecified: Secondary | ICD-10-CM | POA: Diagnosis present

## 2016-08-03 DIAGNOSIS — B182 Chronic viral hepatitis C: Secondary | ICD-10-CM | POA: Diagnosis present

## 2016-08-03 DIAGNOSIS — E872 Acidosis: Secondary | ICD-10-CM | POA: Diagnosis present

## 2016-08-03 DIAGNOSIS — R579 Shock, unspecified: Secondary | ICD-10-CM

## 2016-08-03 DIAGNOSIS — K7030 Alcoholic cirrhosis of liver without ascites: Secondary | ICD-10-CM | POA: Diagnosis present

## 2016-08-03 DIAGNOSIS — F10239 Alcohol dependence with withdrawal, unspecified: Secondary | ICD-10-CM | POA: Diagnosis present

## 2016-08-03 DIAGNOSIS — R410 Disorientation, unspecified: Secondary | ICD-10-CM | POA: Diagnosis present

## 2016-08-03 DIAGNOSIS — K920 Hematemesis: Secondary | ICD-10-CM | POA: Diagnosis present

## 2016-08-03 DIAGNOSIS — E538 Deficiency of other specified B group vitamins: Secondary | ICD-10-CM | POA: Diagnosis present

## 2016-08-03 DIAGNOSIS — I469 Cardiac arrest, cause unspecified: Secondary | ICD-10-CM | POA: Diagnosis not present

## 2016-08-03 DIAGNOSIS — K922 Gastrointestinal hemorrhage, unspecified: Secondary | ICD-10-CM | POA: Diagnosis present

## 2016-08-03 DIAGNOSIS — R451 Restlessness and agitation: Secondary | ICD-10-CM | POA: Diagnosis present

## 2016-08-03 DIAGNOSIS — D6959 Other secondary thrombocytopenia: Secondary | ICD-10-CM | POA: Diagnosis present

## 2016-08-03 DIAGNOSIS — R578 Other shock: Secondary | ICD-10-CM

## 2016-08-03 DIAGNOSIS — K7290 Hepatic failure, unspecified without coma: Secondary | ICD-10-CM | POA: Diagnosis not present

## 2016-08-03 DIAGNOSIS — K921 Melena: Secondary | ICD-10-CM | POA: Diagnosis present

## 2016-08-03 DIAGNOSIS — N179 Acute kidney failure, unspecified: Secondary | ICD-10-CM | POA: Diagnosis not present

## 2016-08-03 DIAGNOSIS — J45909 Unspecified asthma, uncomplicated: Secondary | ICD-10-CM | POA: Diagnosis present

## 2016-08-03 DIAGNOSIS — E871 Hypo-osmolality and hyponatremia: Secondary | ICD-10-CM | POA: Diagnosis present

## 2016-08-03 DIAGNOSIS — E875 Hyperkalemia: Secondary | ICD-10-CM | POA: Diagnosis present

## 2016-08-03 DIAGNOSIS — K2980 Duodenitis without bleeding: Secondary | ICD-10-CM | POA: Diagnosis present

## 2016-08-03 DIAGNOSIS — I959 Hypotension, unspecified: Secondary | ICD-10-CM | POA: Insufficient documentation

## 2016-08-03 DIAGNOSIS — Z452 Encounter for adjustment and management of vascular access device: Secondary | ICD-10-CM

## 2016-08-03 DIAGNOSIS — D689 Coagulation defect, unspecified: Secondary | ICD-10-CM | POA: Diagnosis present

## 2016-08-03 DIAGNOSIS — Z8673 Personal history of transient ischemic attack (TIA), and cerebral infarction without residual deficits: Secondary | ICD-10-CM | POA: Diagnosis not present

## 2016-08-03 DIAGNOSIS — R571 Hypovolemic shock: Secondary | ICD-10-CM | POA: Diagnosis present

## 2016-08-03 DIAGNOSIS — N170 Acute kidney failure with tubular necrosis: Secondary | ICD-10-CM | POA: Diagnosis not present

## 2016-08-03 DIAGNOSIS — Z6841 Body Mass Index (BMI) 40.0 and over, adult: Secondary | ICD-10-CM | POA: Diagnosis not present

## 2016-08-03 DIAGNOSIS — G4733 Obstructive sleep apnea (adult) (pediatric): Secondary | ICD-10-CM | POA: Diagnosis present

## 2016-08-03 DIAGNOSIS — Z87891 Personal history of nicotine dependence: Secondary | ICD-10-CM | POA: Diagnosis not present

## 2016-08-03 DIAGNOSIS — M1990 Unspecified osteoarthritis, unspecified site: Secondary | ICD-10-CM | POA: Diagnosis present

## 2016-08-03 LAB — CBC WITH DIFFERENTIAL/PLATELET
BASOS ABS: 0.1 10*3/uL (ref 0.0–0.1)
Basophils Relative: 1 %
Eosinophils Absolute: 0.8 10*3/uL — ABNORMAL HIGH (ref 0.0–0.7)
Eosinophils Relative: 8 %
HEMATOCRIT: 19.5 % — AB (ref 36.0–46.0)
Hemoglobin: 6.8 g/dL — CL (ref 12.0–15.0)
LYMPHS ABS: 1.9 10*3/uL (ref 0.7–4.0)
Lymphocytes Relative: 18 %
MCH: 34.2 pg — ABNORMAL HIGH (ref 26.0–34.0)
MCHC: 34.9 g/dL (ref 30.0–36.0)
MCV: 98 fL (ref 78.0–100.0)
MONO ABS: 1.2 10*3/uL — AB (ref 0.1–1.0)
Monocytes Relative: 12 %
NEUTROS ABS: 6.4 10*3/uL (ref 1.7–7.7)
Neutrophils Relative %: 61 %
PLATELETS: 60 10*3/uL — AB (ref 150–400)
RBC: 1.99 MIL/uL — AB (ref 3.87–5.11)
RDW: 24 % — AB (ref 11.5–15.5)
WBC: 10.4 10*3/uL (ref 4.0–10.5)

## 2016-08-03 LAB — CBC
HEMATOCRIT: 21.3 % — AB (ref 36.0–46.0)
HEMOGLOBIN: 7.6 g/dL — AB (ref 12.0–15.0)
MCH: 34.7 pg — ABNORMAL HIGH (ref 26.0–34.0)
MCHC: 35.7 g/dL (ref 30.0–36.0)
MCV: 97.3 fL (ref 78.0–100.0)
Platelets: 62 10*3/uL — ABNORMAL LOW (ref 150–400)
RBC: 2.19 MIL/uL — ABNORMAL LOW (ref 3.87–5.11)
RDW: 23.8 % — ABNORMAL HIGH (ref 11.5–15.5)
WBC: 10.1 10*3/uL (ref 4.0–10.5)

## 2016-08-03 LAB — BASIC METABOLIC PANEL
ANION GAP: 9 (ref 5–15)
ANION GAP: 9 (ref 5–15)
ANION GAP: 7 (ref 5–15)
BUN: 62 mg/dL — ABNORMAL HIGH (ref 6–20)
BUN: 59 mg/dL — ABNORMAL HIGH (ref 6–20)
BUN: 63 mg/dL — ABNORMAL HIGH (ref 6–20)
CALCIUM: 8.9 mg/dL (ref 8.9–10.3)
CALCIUM: 9.3 mg/dL (ref 8.9–10.3)
CHLORIDE: 107 mmol/L (ref 101–111)
CO2: 14 mmol/L — ABNORMAL LOW (ref 22–32)
CO2: 17 mmol/L — AB (ref 22–32)
CO2: 21 mmol/L — ABNORMAL LOW (ref 22–32)
CREATININE: 3.48 mg/dL — AB (ref 0.44–1.00)
CREATININE: 3.76 mg/dL — AB (ref 0.44–1.00)
Calcium: 9.1 mg/dL (ref 8.9–10.3)
Chloride: 109 mmol/L (ref 101–111)
Chloride: 106 mmol/L (ref 101–111)
Creatinine, Ser: 3.08 mg/dL — ABNORMAL HIGH (ref 0.44–1.00)
GFR calc non Af Amer: 13 mL/min — ABNORMAL LOW (ref >60)
GFR, EST AFRICAN AMERICAN: 18 mL/min — AB (ref >60)
GFR, EST AFRICAN AMERICAN: 15 mL/min — AB (ref >60)
GFR, EST AFRICAN AMERICAN: 14 mL/min — AB (ref >60)
GFR, EST NON AFRICAN AMERICAN: 15 mL/min — AB (ref >60)
GFR, EST NON AFRICAN AMERICAN: 12 mL/min — AB (ref >60)
GLUCOSE: 82 mg/dL (ref 65–99)
GLUCOSE: 105 mg/dL — AB (ref 65–99)
Glucose, Bld: 97 mg/dL (ref 65–99)
POTASSIUM: 6.5 mmol/L — AB (ref 3.5–5.1)
Potassium: 6.4 mmol/L (ref 3.5–5.1)
Potassium: 5.5 mmol/L — ABNORMAL HIGH (ref 3.5–5.1)
SODIUM: 134 mmol/L — AB (ref 135–145)
Sodium: 132 mmol/L — ABNORMAL LOW (ref 135–145)
Sodium: 133 mmol/L — ABNORMAL LOW (ref 135–145)

## 2016-08-03 LAB — TROPONIN I
Troponin I: 0.07 ng/mL (ref <0.03)
Troponin I: <0.03 ng/mL (ref <0.03)

## 2016-08-03 LAB — FIBRINOGEN: FIBRINOGEN: 66 mg/dL — AB (ref 210–475)

## 2016-08-03 LAB — GLUCOSE, CAPILLARY
Glucose-Capillary: 88 mg/dL (ref 65–99)
Glucose-Capillary: 79 mg/dL (ref 65–99)

## 2016-08-03 LAB — CBG MONITORING, ED: GLUCOSE-CAPILLARY: 114 mg/dL — AB (ref 65–99)

## 2016-08-03 LAB — BLOOD GAS, VENOUS
Acid-base deficit: 9.7 mmol/L — ABNORMAL HIGH (ref 0.0–2.0)
Bicarbonate: 14.3 mEq/L — ABNORMAL LOW (ref 20.0–24.0)
O2 Content: 2 L/min
O2 SAT: 97.4 %
PCO2 VEN: 25.4 mmHg — AB (ref 45.0–50.0)
PH VEN: 7.367 — AB (ref 7.250–7.300)
PO2 VEN: 101 mmHg — AB (ref 31.0–45.0)
Patient temperature: 98.6
TCO2: 13.7 mmol/L (ref 0–100)

## 2016-08-03 LAB — MRSA PCR SCREENING: MRSA by PCR: NEGATIVE

## 2016-08-03 LAB — POC OCCULT BLOOD, ED: Fecal Occult Bld: POSITIVE — AB

## 2016-08-03 LAB — PROTIME-INR
INR: 2.45
Prothrombin Time: 27 seconds — ABNORMAL HIGH (ref 11.4–15.2)

## 2016-08-03 LAB — PREPARE RBC (CROSSMATCH)

## 2016-08-03 LAB — LACTIC ACID, PLASMA: LACTIC ACID, VENOUS: 3.7 mmol/L — AB (ref 0.5–1.9)

## 2016-08-03 MED ORDER — VITAMIN K1 10 MG/ML IJ SOLN
10.0000 mg | Freq: Once | INTRAMUSCULAR | Status: AC
  Administered 2016-08-03: 10 mg via INTRAVENOUS
  Filled 2016-08-03: qty 1

## 2016-08-03 MED ORDER — PHENYLEPHRINE HCL 10 MG/ML IJ SOLN
30.0000 ug/min | INTRAVENOUS | Status: DC
  Administered 2016-08-03: 20 ug/min via INTRAVENOUS
  Administered 2016-08-03: 120 ug/min via INTRAVENOUS
  Administered 2016-08-03: 30 ug/min via INTRAVENOUS
  Administered 2016-08-03: 150 ug/min via INTRAVENOUS
  Filled 2016-08-03 (×4): qty 1

## 2016-08-03 MED ORDER — CETYLPYRIDINIUM CHLORIDE 0.05 % MT LIQD
7.0000 mL | Freq: Two times a day (BID) | OROMUCOSAL | Status: DC
  Administered 2016-08-03 – 2016-08-09 (×12): 7 mL via OROMUCOSAL

## 2016-08-03 MED ORDER — NOREPINEPHRINE BITARTRATE 1 MG/ML IV SOLN
2.0000 ug/min | INTRAVENOUS | Status: DC
  Administered 2016-08-04: 27 ug/min via INTRAVENOUS
  Administered 2016-08-04: 20 ug/min via INTRAVENOUS
  Administered 2016-08-04: 10 ug/min via INTRAVENOUS
  Filled 2016-08-03 (×3): qty 4

## 2016-08-03 MED ORDER — FOLIC ACID 5 MG/ML IJ SOLN
1.0000 mg | Freq: Every day | INTRAMUSCULAR | Status: DC
  Administered 2016-08-03 – 2016-08-09 (×7): 1 mg via INTRAVENOUS
  Filled 2016-08-03 (×10): qty 0.2

## 2016-08-03 MED ORDER — ALBUTEROL SULFATE (2.5 MG/3ML) 0.083% IN NEBU: 2.5000 mg | INHALATION_SOLUTION | RESPIRATORY_TRACT | Status: DC | PRN

## 2016-08-03 MED ORDER — SODIUM POLYSTYRENE SULFONATE 15 GM/60ML PO SUSP
30.0000 g | Freq: Once | ORAL | Status: AC
  Administered 2016-08-03: 30 g via ORAL
  Filled 2016-08-03: qty 120

## 2016-08-03 MED ORDER — LACTULOSE 10 GM/15ML PO SOLN
30.0000 g | Freq: Two times a day (BID) | ORAL | Status: DC
  Administered 2016-08-04: 30 g via ORAL
  Filled 2016-08-03 (×5): qty 45

## 2016-08-03 MED ORDER — ONDANSETRON HCL 4 MG/2ML IJ SOLN
4.0000 mg | Freq: Four times a day (QID) | INTRAMUSCULAR | Status: DC | PRN
  Administered 2016-08-05: 4 mg via INTRAVENOUS
  Filled 2016-08-03: qty 2

## 2016-08-03 MED ORDER — MIDAZOLAM HCL 2 MG/2ML IJ SOLN
INTRAMUSCULAR | Status: AC
  Administered 2016-08-03: 2 mg
  Filled 2016-08-03: qty 2

## 2016-08-03 MED ORDER — SODIUM CHLORIDE 0.9 % IV SOLN
Freq: Once | INTRAVENOUS | Status: AC
  Administered 2016-08-04: 14:00:00 via INTRAVENOUS

## 2016-08-03 MED ORDER — SODIUM CHLORIDE 0.9 % IV SOLN
1.0000 g | Freq: Once | INTRAVENOUS | Status: AC
  Administered 2016-08-03: 1 g via INTRAVENOUS
  Filled 2016-08-03: qty 10

## 2016-08-03 MED ORDER — DEXTROSE 50 % IV SOLN
INTRAVENOUS | Status: AC
  Filled 2016-08-03: qty 50

## 2016-08-03 MED ORDER — DEXTROSE 5 % IV SOLN
INTRAVENOUS | Status: DC
Start: 2016-08-03 — End: 2016-08-10
  Administered 2016-08-03 – 2016-08-08 (×8): via INTRAVENOUS
  Filled 2016-08-03 (×15): qty 150

## 2016-08-03 MED ORDER — DEXTROSE 5 % IV SOLN
1.0000 g | INTRAVENOUS | Status: DC
  Administered 2016-08-03 – 2016-08-08 (×6): 1 g via INTRAVENOUS
  Filled 2016-08-03 (×7): qty 10

## 2016-08-03 MED ORDER — THIAMINE HCL 100 MG/ML IJ SOLN
100.0000 mg | Freq: Every day | INTRAMUSCULAR | Status: DC
  Administered 2016-08-03 – 2016-08-09 (×7): 100 mg via INTRAVENOUS
  Filled 2016-08-03 (×7): qty 2

## 2016-08-03 MED ORDER — SODIUM CHLORIDE 0.9 % IV SOLN
Freq: Once | INTRAVENOUS | Status: AC
  Administered 2016-08-03: 21:00:00 via INTRAVENOUS

## 2016-08-03 MED ORDER — INSULIN ASPART 100 UNIT/ML IV SOLN
10.0000 [IU] | Freq: Once | INTRAVENOUS | Status: AC
  Administered 2016-08-03: 10 [IU] via INTRAVENOUS

## 2016-08-03 MED ORDER — DEXTROSE 50 % IV SOLN
1.0000 | Freq: Once | INTRAVENOUS | Status: AC
  Administered 2016-08-03: 50 mL via INTRAVENOUS

## 2016-08-03 NOTE — Progress Notes (Signed)
CRITICAL VALUE ALERT  Critical value received:  K=6.4  Date of notification:  08/03/2016  Time of notification:  1145  Critical value read back:Yes.    Nurse who received alert:  Edison PaceAshley Dayne Chait  MD notified (1st page):  Vassie LollAlva, who was on unit

## 2016-08-03 NOTE — Progress Notes (Signed)
CRITICAL VALUE ALERT  Critical value received:  6.8 hgb  Date of notification: 08/03/16   Time of notification:  19:25  Critical value read back:Yes.    Nurse who received alert:  Arnoldo LenisAlex Dorcus Riga  MD notified (1st page):  Dr. Arsenio LoaderSommer  Time of first page:  19:26  Responding MD:  Dr. Arsenio LoaderSommer

## 2016-08-03 NOTE — H&P (Signed)
PULMONARY / CRITICAL CARE MEDICINE   Name: Diane Cook MRN: [id number] DOB: Oct 06, 1956    ADMISSION DATE:  08/11/2016 CONSULTATION DATE:  08/14/2016  REFERRING MD:  Dalene SeltzerSchlossman - EDP  CHIEF COMPLAINT:  Abd pain, dark stools  HISTORY OF PRESENT ILLNESS:   Diane Cook is a 60 y.o. female with PMH as outlined below including known ETOH cirrhosis, chronic hep C.  She had recent admission 06/06/16 through 06/12/16 for GI bleed.  Had EGD 6/14 which revealed duodenitis, no varices or ulcers.  She was discharged and instructed to have outpatient follow up with GI.  On 8/6, [facility] with melena and hematemesis x 1 along with RLQ abd pain.  Symptoms had started 2 - 3 days earlier and persisted.  In ED, she had 1 episode of melena but no hematemesis or hematochezia.  Hgb was 5.6, she was started on PRBC transfusion.  In addition, she had multiple metabolic derangements therefore, ICU admission was requested.  PAST MEDICAL HISTORY :  She  has a past medical history of Alcoholism (HCC); Arthritis (Dx 2014); Asthma (Dx 2001); Bipolar disorder (HCC) ([id number](Dx1996); Family history of adverse reaction to anesthesia; H/O non anemic vitamin B12 deficiency; Hepatitis C (Dx 2011); Hypertension (Dx 2004); and Stroke [facility](HCC) (Dx 1997).  PAST SURGICAL HISTORY: She  has a past surgical history that includes Abdominal hysterectomy; Cholecystectomy; and Esophagogastroduodenoscopy (egd) with propofol (N/A, 06/10/2016).  No Known Allergies  No current facility-administered medications on file prior to encounter.    Current Outpatient Prescriptions on File Prior to Encounter  Medication Sig  . albuterol (PROVENTIL HFA;VENTOLIN HFA) 108 (90 Base) MCG/ACT inhaler Inhale 2 puffs into the lungs every 6 (six) hours as needed for wheezing or shortness of breath.  Marland Kitchen. ammonium lactate (LAC-HYDRIN) 12 % cream Apply topically as needed for dry skin.  . fluticasone (FLONASE) 50 MCG/ACT nasal spray Place 2 sprays into both  nostrils daily. (Patient taking differently: Place 2 sprays into both nostrils daily as needed for allergies. )  . folic acid (FOLVITE) 1 MG tablet Take 1 tablet (1 mg total) by mouth daily.  . furosemide (LASIX) 40 MG tablet Take 1 tablet (40 mg total) by mouth daily.  Marland Kitchen. lactulose (CHRONULAC) 10 GM/15ML solution Take 30 mLs (20 g total) by mouth 2 (two) times daily.  . Multiple Vitamins-Minerals (MULTIVITAMIN) tablet Take 1 tablet by mouth daily.  . pantoprazole (PROTONIX) 40 MG tablet Take 1 tablet (40 mg total) by mouth daily before supper.  . potassium chloride (K-DUR,KLOR-CON) 10 MEQ tablet Take 10 mEq by mouth daily.  Marland Kitchen. spironolactone (ALDACTONE) 50 MG tablet Take 1 tablet (50 mg total) by mouth daily.  Marland Kitchen. thiamine 100 MG tablet Take 1 tablet (100 mg total) by mouth daily.    FAMILY HISTORY:  Her indicated that her mother is deceased. She indicated that her father is deceased. She indicated that the status of her daughter is unknown.    SOCIAL HISTORY: She  reports that she quit smoking about 14 months ago. Her smoking use included Cigarettes. She has a 10.00 pack-year smoking history. She has never used smokeless tobacco. She reports that she does not drink alcohol or use drugs.  REVIEW OF SYSTEMS:   All negative; except for those that are bolded, which indicate positives.  Constitutional: weight loss, weight gain, night sweats, fevers, chills, fatigue, weakness.  HEENT: headaches, sore throat, sneezing, nasal congestion, post nasal drip, difficulty swallowing, tooth/dental problems, visual complaints, visual changes, ear aches. Neuro: difficulty  with speech, weakness, numbness, ataxia. CV:  chest pain, orthopnea, PND, swelling in lower extremities, dizziness, palpitations, syncope.  Resp: cough, hemoptysis, dyspnea, wheezing. GI: heartburn, indigestion, abdominal pain, nausea, vomiting, diarrhea, constipation, change in bowel habits, loss of appetite, hematemesis, melena,  hematochezia.  GU: dysuria, change in color of urine, urgency or frequency, flank pain, hematuria. MSK: joint pain or swelling, decreased range of motion. Psych: change in mood or affect, depression, anxiety, suicidal ideations, homicidal ideations. Skin: rash, itching, bruising.   SUBJECTIVE:  Denies further hematemesis.  Had 1 episode melena earlier in ED.  Hemodynamically stable.  Denies chest pain.  VITAL SIGNS: BP 116/61 (BP Location: Right Arm)   Pulse 92   Temp 97.4 F (36.3 C) (Oral)   Resp 16   Ht 5\' 11"  (1.803 m)   Wt (!) 328 lb (148.8 kg)   SpO2 100%   BMI 45.75 kg/m   HEMODYNAMICS:    VENTILATOR SETTINGS:    INTAKE / OUTPUT: No intake/output data recorded.   PHYSICAL EXAMINATION: General: Obese female, in NAD. Neuro: A&O x 3, non-focal.  HEENT: [place]/AT. PERRL, sclerae anicteric.  Dried blood in oral cavity. Cardiovascular: RRR, 3/6 SEM.  Lungs: Respirations even and unlabored.  CTA bilaterally, No W/R/R. Abdomen: Obese, BS x 4, soft, tender RLQ. Musculoskeletal: No gross deformities, 1+ non pitting edema.  Skin: Intact, warm, no rashes.  LABS:  BMET  Recent Labs Lab 2016/08/23 2253 08/23/16 2305  NA 131* 134*  K 6.6* 7.6*  CL 106 107  CO2 15*  --   BUN 63* 69*  CREATININE 3.23* 3.40*  GLUCOSE 80 73    Electrolytes  Recent Labs Lab August 23, 2016 2253  CALCIUM 9.0    CBC  Recent Labs Lab 08-23-2016 2253 08-23-16 2305  WBC 11.5*  --   HGB 5.6* 7.1*  HCT 15.8* 21.0*  PLT 59*  --     Coag's  Recent Labs Lab 08-23-16 2253  INR 4.64*    Sepsis Markers  Recent Labs Lab 08/23/2016 2305  LATICACIDVEN 6.81*    ABG No results for input(s): PHART, PCO2ART, PO2ART in the last 168 hours.  Liver Enzymes  Recent Labs Lab 08/23/16 2253  AST 250*  ALT 82*  ALKPHOS 113  BILITOT 13.9*  ALBUMIN 1.4*    Cardiac Enzymes No results for input(s): TROPONINI, PROBNP in the last 168 hours.  Glucose  Recent Labs Lab August 23, 2016 2202  08/23/2016 2347  GLUCAP 70 114*    Imaging Dg Chest Portable 1 View  Result Date: 08/03/2016 CLINICAL DATA:  Acute onset of right lower quadrant abdominal pain and vomiting. Hematemesis. Initial encounter. EXAM: PORTABLE CHEST 1 VIEW COMPARISON:  None. FINDINGS: The lungs are well-aerated. Vascular congestion is noted. Increased interstitial markings may reflect mild interstitial edema. No significant pleural effusion or pneumothorax is seen. The cardiomediastinal silhouette is enlarged. No acute osseous abnormalities are seen. IMPRESSION: Vascular congestion and cardiomegaly. Increased interstitial markings may reflect mild interstitial edema. Electronically Signed   By: Roanna Raider M.D.   On: 08/03/2016 00:23     STUDIES:  CXR 8/7 > mild edema.  CULTURES: None.  ANTIBIOTICS: None.  SIGNIFICANT EVENTS: 8/7 > admit for GI bleed, presumed upper.  LINES/TUBES: None.  DISCUSSION: 60 y.o. F with hx ETOH cirrhosis and hep c, had recent UGIB in June 2017 due to duodenitis.  Admitted again 8/7 with GI bleed, presumed upper again.  ASSESSMENT / PLAN:  GASTROINTESTINAL A:   UGIB - presumed upper.  Had admission June 2017  where EGD revealed duodenitis. Hx ETOH cirrhosis, HCV. Nutrition. P:   Continue PPI gtt. GI consult. NPO.  HEMATOLOGIC A:   AoC anemia - exacerbated due to acute UGIB. Chronic thrombocytopenia - likely due to bone marrow suppression from ETOH. Coagulopathy - likely due to liver dysfunction. VTE Prophylaxis. P:  Continue PRBC transfusions to maintain Hgb > 7. SCD's. CBC in AM.  RENAL A:   Hyponatremia. Hyperkalemia - ? Accuracy. S/p temporizing measures in ED. AoCKD - presumed due to decreased PO intake + UGIB + N/V. NAGMA. P:   HCO3 @ 150. Repeat STAT BMP - treat K accordingly.  CARDIOVASCULAR A:  Hypotension - Likely due to GI bleed + decreased PO intake.  Responding to IVF.  P:  Monitor hemodynamics. Continue PRBC transfusion. Trend  troponin.  PULMONARY A: Hx asthma. Probable OSA / OHS. P:   Pulmonary hygiene.  INFECTIOUS A:   No indication of infection. P:   Monitor clinically.  ENDOCRINE A:   No acute issues.   P:   No interventions required.  NEUROLOGIC A:   Hx ETOH abuse. P:   Thiamine / folate. ETOH cessation counseling.  Family updated: Husband and daughter updated at bedside.  Interdisciplinary Family Meeting v Palliative Care Meeting:  Due by: 08/25/2016.  CC time: 40 minutes.   Rutherford Guys, [place] [facility] Pager: [phone]  or [phone] 08/03/2016, 1:01 AM   Attending Note:  I have examined patient, reviewed labs, studies and notes. I have discussed the case with Ihor Dow, and I agree with the data and plans as amended above. 60 yo woman with cirrhosis due to remote EtOH and Hep C, recent admission for UGIB due to duodenitis, no varices on EGD. Now with recurrent UGIB beginning 20:00 on 8/6. Presented to ED with hypotension, symptomatic anemia, acute renal failure with hyperkalemia (treated). On my evaluation she is normalizing her BP, is more alert per family. She is morbidly obese, has 3/6 systolic M, clear lungs. She is receivig her 3rd u PRBC now. Will complete this, then check BMP to assess hyperkalemia. May require kayexalate if remains elevated, possibly even renal eval. I will stop the octreotide, continue protonix, admit her to [facility] given the potential to require renal replacement. Independent critical care time is 45 minutes.   Levy Pupa, MD, PhD 08/03/2016, 1:18 AM [facility] [phone] or if no answer [phone]

## 2016-08-03 NOTE — ED Notes (Signed)
Patient pulled thumb IV out. Patient was cleaned up. Patient had a large bowel movement.

## 2016-08-03 NOTE — ED Notes (Signed)
Notified MD and RN of critical K+ of 6.5 and troponin 0.07.

## 2016-08-03 NOTE — ED Notes (Signed)
Report called to Vikki PortsValerie, [facility].

## 2016-08-03 NOTE — Progress Notes (Signed)
PULMONARY / CRITICAL CARE MEDICINE   Name: Diane Cook MRN: [id number] DOB: [birth year]    ADMISSION DATE:  Aug 05, 2016 CONSULTATION DATE:  08/14/2016  REFERRING MD:  Dalene Seltzer - EDP  CHIEF COMPLAINT:  Abd pain, dark stools  HISTORY OF PRESENT ILLNESS:   Diane Cook is a 60 y.o. female with PMH  including known ETOH cirrhosis, chronic hep C.  She had recent admission 06/06/16 through 06/12/16 for GI bleed.  Had EGD 6/14 which revealed duodenitis, no varices or ulcers.  She was discharged and instructed to have outpatient follow up with GI.  On 8/6, she presented again to [facility] ED with melena and hematemesis x 1 along with RLQ abd pain.  Symptoms had started 2 - 3 days earlier and persisted.  In ED, she had 1 episode of melena but no hematemesis or hematochezia.  Hgb was 5.6, she was started on PRBC transfusion.  In addition, she had AKI with hyperkalemia   SUBJECTIVE:    Denies chest pain. Or dyspnea No further episodes of hematemesis or melena after arrival to ICU  VITAL SIGNS: BP (!) 99/53   Pulse 89   Temp 97.5 F (36.4 C) (Oral)   Resp 17   Ht  (1.803 m)   Wt (!) 358 lb 0.4 oz (162.4 kg)   SpO2 100%   BMI 49.93 kg/m   HEMODYNAMICS:    VENTILATOR SETTINGS:    INTAKE / OUTPUT: I/O last 3 completed shifts: In: 593.3 [I.V.:258.3; Blood:335] Out: -    PHYSICAL EXAMINATION: General: Obese female, in NAD. Neuro: Lethargic A&O x 2, non-focal HEENT: [place]/AT. PERRL, sclerae anicteric.   Cardiovascular: RRR, 3/6 SEM.  Lungs: Respirations even and unlabored.  CTA bilaterally, No W/R/R. Abdomen: Obese, BS x 4, soft, tender RLQ. Musculoskeletal: No gross deformities, 1+ non pitting edema.  Skin: Intact, warm, no rashes.  LABS:  BMET  Recent Labs Lab 07/30/2016 2253 August 05, 2016 2305 08/03/16 0231 08/03/16 1030  NA 131* 134* 132* 133*  K 6.6* 7.6* 6.5* 6.4*  CL 106 107 109 107  CO2 15*  --  14* 17*  BUN 63* 69* 62* 59*  CREATININE 3.23* 3.40* 3.08* 3.48*  GLUCOSE 80 73  82 105*    Electrolytes  Recent Labs Lab 08/15/2016 2253 08/03/16 0231 08/03/16 1030  CALCIUM 9.0 8.9 9.1    CBC  Recent Labs Lab 07/28/2016 2253 05-Aug-2016 2305 08/03/16 1143  WBC 11.5*  --  10.1  HGB 5.6* 7.1* 7.6*  HCT 15.8* 21.0* 21.3*  PLT 59*  --  62*    Coag's  Recent Labs Lab 08/06/2016 2253  INR 4.64*    Sepsis Markers  Recent Labs Lab 08/07/2016 2305 08/03/16 0633  LATICACIDVEN 6.81* 3.7*    ABG No results for input(s): PHART, PCO2ART, PO2ART in the last 168 hours.  Liver Enzymes  Recent Labs Lab 08/20/2016 2253  AST 250*  ALT 82*  ALKPHOS 113  BILITOT 13.9*  ALBUMIN 1.4*    Cardiac Enzymes  Recent Labs Lab 08/03/16 0231  TROPONINI 0.07*    Glucose  Recent Labs Lab 08/06/2016 2202 08/19/2016 2347 08/03/16 0623  GLUCAP 70 114* 88    Imaging Dg Chest Portable 1 View  Result Date: 08/03/2016 CLINICAL DATA:  Acute onset of right lower quadrant abdominal pain and vomiting. Hematemesis. Initial encounter. EXAM: PORTABLE CHEST 1 VIEW COMPARISON:  None. FINDINGS: The lungs are well-aerated. Vascular congestion is noted. Increased interstitial markings may reflect mild interstitial edema. No significant pleural effusion or pneumothorax is seen.  The cardiomediastinal silhouette is enlarged. No acute osseous abnormalities are seen. IMPRESSION: Vascular congestion and cardiomegaly. Increased interstitial markings may reflect mild interstitial edema. Electronically Signed   By: Roanna RaiderJeffery  Chang M.D.   On: 08/03/2016 00:23     STUDIES:  CXR 8/7 > mild edema.  CULTURES: None.  ANTIBIOTICS: None.  SIGNIFICANT EVENTS: 8/7 > admit for GI bleed, presumed upper.  LINES/TUBES: None.  DISCUSSION: 60 y.o. F with hx ETOH cirrhosis and hep c, had recent UGIB in June 2017 due to duodenitis.  Admitted again 8/7 with GI bleed, presumed upper again.  ASSESSMENT / PLAN:  GASTROINTESTINAL A:   UGIB - presumed upper.  Had admission June 2017 where EGD  revealed duodenitis. Hx ETOH cirrhosis, HCV. Nutrition. P:   Continue PPI gtt. GI Following- defer EGD based on findings of duodenitis not too long ago NPO.  HEMATOLOGIC A:   AoC anemia - exacerbated due to acute UGIB. S/p 3 U PRBC Chronic thrombocytopenia - likely due to bone marrow suppression from ETOH. Coagulopathy - likely due to liver dysfunction. VTE Prophylaxis. P:  Continue PRBC transfusions to maintain Hgb > 7. Improve coagulopathy with vitamin K and 4 units FFP SCD's. CBC in AM.  RENAL A:   Hyponatremia. Hyperkalemia - ? Accuracy. S/p temporizing measures in ED. AoCKD - presumed due to decreased PO intake + UGIB + N/V. NAGMA. P:   HCO3 @ 100. Treated for hyperkalemia with calcium, bicarbonate and Kayexalate-recheck at 1800  CARDIOVASCULAR A:  Hemorrhagic shock - Likely due to GI bleed + decreased PO intake.  P:  Monitor hemodynamics. Continue PRBC transfusion. Trend troponin.  PULMONARY A: Hx asthma. Probable OSA / OHS. P:   Pulmonary hygiene.  INFECTIOUS A:   No indication of infection. P:   Monitor clinically.  ENDOCRINE A:   No acute issues.   P:   No interventions required.  NEUROLOGIC A:   Hx ETOH abuse. P:   Thiamine / folate. ETOH cessation counseling.  Family updated: Husband and daughter updated at bedside.  Interdisciplinary Family Meeting v Palliative Care Meeting:  Due by: 08/26/2016.  CC time: 35 minutes.   Cyril Mourningakesh Alva MD. Tonny BollmanFCCP. [place] [facility] Pager [phone] If no response call [phone]    08/03/2016, 2:03 PM

## 2016-08-03 NOTE — ED Notes (Signed)
[facility]

## 2016-08-03 NOTE — Consult Note (Signed)
Subjective:   HPI  The patient is a 60 year old female with a history of alcoholism, hepatitis C, and cirrhosis of the liver. She was recently in this hospital for gastrointestinal bleeding. She had an EGD done on June 14 with findings of duodenitis with erosions. There were no esophageal varices. She was discharged from the hospital in 2 weeks ago a hemoglobin was reported to be 8.2 g. She returned to the [facility] emergency [facility] but was subsequently transferred here with complaints of recurring gastrointestinal bleeding. Her complaint was that of hematemesis as well as melena. Her hemoglobin and hematocrit on admission this time were 5.6 and 15.8. Platelet count 59,000. Prothrombin time 45.1, INR 4.64. She reportedly continues to drink alcohol. Total bilirubin 13.9, AST 250, ALT 82. She was not given any fresh frozen plasma last night. We do not have an updated hemoglobin and hematocrit this morning. She has not had any further hematemesis, but does have melena. She is somewhat confused. Her serum ammonia level is 116, with upper limit of normal 35.  Review of Systems No chest pain. Appears somewhat short of breath.  Past Medical History:  Diagnosis Date  . Alcoholism (HCC)   . Arthritis Dx 2014  . Asthma Dx 2001  . Bipolar disorder (HCC) [id number]  . Family history of adverse reaction to anesthesia    sister & daughter have a hard time waking up   . H/O non anemic vitamin B12 deficiency   . Hepatitis C Dx 2011  . Hypertension Dx 2004  . Stroke [facility](HCC) Dx 1997   Past Surgical History:  Procedure Laterality Date  . ABDOMINAL HYSTERECTOMY    . CHOLECYSTECTOMY    . ESOPHAGOGASTRODUODENOSCOPY (EGD) WITH PROPOFOL N/A 06/10/2016   Procedure: ESOPHAGOGASTRODUODENOSCOPY (EGD) WITH PROPOFOL;  Surgeon: Carman ChingJames Edwards, MD;  Location: [facility] ENDOSCOPY;  Service: Endoscopy;  Laterality: N/A;   Social History   Social History  . Marital status: Married    Spouse name: N/A  . Number  of children: N/A  . Years of education: N/A   Occupational History  . Not on file.   Social History Main Topics  . Smoking status: Former Smoker    Packs/day: 0.25    Years: 40.00    Types: Cigarettes    Quit date: 05/09/2015  . Smokeless tobacco: Never Used  . Alcohol use No     Comment: Patient drinks 6 40oz per day, 1 pint of liquor daily; Last drink 05/09/15  . Drug use: No  . Sexual activity: Not on file   Other Topics Concern  . Not on file   Social History Narrative  . No narrative on file   family history includes Diabetes in her daughter and mother.  Current Facility-Administered Medications:  .  albuterol (PROVENTIL) (2.5 MG/3ML) 0.083% nebulizer solution 2.5 mg, 2.5 mg, Nebulization, Q4H PRN, Leslye Peerobert S Byrum, MD .  folic acid injection 1 mg, 1 mg, Intravenous, Daily, Rahul P Desai, PA-C, 1 mg at 08/03/16 0951 .  ondansetron (ZOFRAN) injection 4 mg, 4 mg, Intravenous, Q6H PRN, Leslye Peerobert S Byrum, MD .  pantoprazole (PROTONIX) 80 mg in sodium chloride 0.9 % 250 mL (0.32 mg/mL) infusion, 8 mg/hr, Intravenous, Continuous, Alvira MondayErin Schlossman, MD, Last Rate: 25 mL/hr at 08/03/16 0924, 8 mg/hr at 08/03/16 0924 .  phenylephrine (NEO-SYNEPHRINE) 10 mg in dextrose 5 % 250 mL (0.04 mg/mL) infusion, 30-200 mcg/min, Intravenous, Continuous, Kalman ShanMurali Ramaswamy, MD, Last Rate: 60 mL/hr at 08/03/16 0952, 40 mcg/min at 08/03/16 0952 .  sodium bicarbonate 150 mEq in dextrose 5 % 1,000 mL infusion, , Intravenous, Continuous, Kalman Shan, MD, Last Rate: 100 mL/hr at 08/03/16 0124 .  thiamine (B-1) injection 100 mg, 100 mg, Intravenous, Daily, Rahul P Desai, PA-C, 100 mg at 08/03/16 0919 No Known Allergies   Objective:     BP (!) 81/57   Pulse 83   Temp 97.4 F (36.3 C) (Oral)   Resp 13   Ht  (1.803 m)   Wt (!) 162.4 kg (358 lb 0.4 oz)   SpO2 100%   BMI 49.93 kg/m   She appears somewhat short of breath, somewhat confused. Slow to respond.  Scleral icterus  Heart regular  rhythm no murmurs heard  Lungs clear  Abdomen: Bowel sounds present, soft, nontender, obese  Laboratory No components found for: D1    Assessment:     #1. Alcoholic/hepatitis C cirrhosis of the liver  #2. GI bleed with recent endoscopy showing duodenitis with erosions, and no evidence of esophageal varices  #3. Coagulopathy secondary to #1  #4. Thrombocytopenia secondary to #1, as well as bone marrow suppression from continued use of alcohol  #5. Hepatic encephalopathy  #6. Jaundice secondary to #1      Plan:     At this point I would recommend continuing to follow hemoglobin and hematocrits. I would recommend transfusing fresh frozen plasma to try and correct or improve her coagulopathy. I would recommend the use of lactulose or Xifaxan for hepatic encephalopathy. At the present time, given the fact that she just had an endoscopy done recently and there were no esophageal varices noted, I am going to recommend that we hold off on EGD. Hopefully with correction of her coagulopathy and supportive care she will no longer have continued bleeding. With her elevated INR and low platelets, it would be difficult if not futile to attempt any therapeutic endoscopic intervention for erosive disease. Should she continue to have further bleeding we can rethink repeat endoscopy. We will follow with you. Lab Results  Component Value Date   HGB 7.1 (L) 29-Aug-2016   HGB 5.6 (LL) Aug 29, 2016   HGB 8.2 (L) 07/20/2016   HCT 21.0 (L) 29-Aug-2016   HCT 15.8 (L) 08-29-16   HCT 22.5 (L) 07/20/2016   ALKPHOS 113 08-29-2016   ALKPHOS 61 06/10/2016   ALKPHOS 66 06/09/2016   AST 250 (H) 2016/08/29   AST 49 (H) 06/10/2016   AST 53 (H) 06/09/2016   ALT 82 (H) 08-29-2016   ALT 23 06/10/2016   ALT 23 06/09/2016

## 2016-08-03 NOTE — Progress Notes (Signed)
eLink Physician-Brief Progress Note Patient Name: Diane AlexandersJeane Cook DOB: 1956-11-25 MRN: [id number]   New arrival from [place] [facility] - obese AA female . Not on vent. Looks strable. Per RN no active blled  BP soft - MAP 60 with sbp 88    Recent Labs Lab 08/21/2016 2253 08/21/2016 2305  HGB 5.6* 7.1*  HCT 15.8* 21.0*  WBC 11.5*  --   PLT 59*  --     Recent Labs Lab 08/21/2016 2253 08/21/2016 2305 08/03/16 0231  NA 131* 134* 132*  K 6.6* 7.6* 6.5*  CL 106 107 109  CO2 15*  --  14*  GLUCOSE 80 73 82  BUN 63* 69* 62*  CREATININE 3.23* 3.40* 3.08*  CALCIUM 9.0  --  8.9    Recent Labs Lab 08/21/2016 2305  LATICACIDVEN 6.81*     Recent Labs Lab 08/03/16 0231  TROPONINI 0.07*     A Hypotension  p Start neo via PIV   Intervention Category Major Interventions: Hypotension - evaluation and management  Marland Reine 08/03/2016, 5:26 AM

## 2016-08-03 NOTE — ED Provider Notes (Signed)
[facility]-EMERGENCY DEPT Provider Note   CSN: [id number] Arrival date & time: 08/23/2016  2142  First Provider Contact:  None       History   Chief Complaint Chief Complaint  Patient presents with  . Abdominal Pain  . Hypotension    HPI Diane Cook is a 60 y.o. female.  HPI 60 year old female with history of cirrhosis, GI bleeding with recent admission in June found to be likely acute duodenitis, presents with concern for hematemesis and abdominal pain.  Daughter reports that she isn't confused over the last several days, Zinacef scleral icterus is worsened. Today, patient had 2 large episodes of emesis with frank blood. They deny any coffee grounds. Denies that it was emesis with streaks of blood, and reports that it was only large amounts of blood.  Patient had noted not noted darker colored stools, however had black tarry stool on arrival to the emergency department.  She had also described right-sided abdominal pain.   History limited by acuity of the condition. Patient blood pressures systolic 70s-90s on arrival to ED.   Past Medical History:  Diagnosis Date  . Alcoholism (HCC)   . Arthritis Dx 2014  . Asthma Dx 2001  . Bipolar disorder (HCC) [id number]  . Family history of adverse reaction to anesthesia    sister & daughter have a hard time waking up   . H/O non anemic vitamin B12 deficiency   . Hepatitis C Dx 2011  . Hypertension Dx 2004  . Stroke [facility]) Dx 1997    Patient Active Problem List   Diagnosis Date Noted  . GI bleed 08/03/2016  . Arterial hypotension   . HTN (hypertension) 07/17/2016  . History of cataract extraction 07/17/2016  . AR (allergic rhinitis) 06/16/2016  . Xerosis of skin 06/16/2016  . Hypokalemia 06/16/2016  . Acute upper gastrointestinal bleeding 06/06/2016  . Absolute anemia 06/06/2016  . AKI (acute kidney injury) (HCC) 06/06/2016  . Acute kidney injury (nontraumatic) (HCC)   . Anemia due to blood loss   . Elevated lactic acid level   .  Jaundice   . Alcoholic cirrhosis of liver without ascites (HCC) 06/12/2015  . Recurrent pain of right knee 05/29/2015  . Chronic hepatitis C without hepatic coma (HCC) 05/14/2015  . Hyperbilirubinemia 05/14/2015  . Tobacco abuse 04/30/2015  . ETOH abuse 04/24/2015  . Coagulopathy (HCC) 04/24/2015  . Anemia 04/24/2015  . Thrombocytopenia (HCC) 04/24/2015  . Hypomagnesemia 04/24/2015  . Severe obesity (BMI >= 40) (HCC) 04/24/2015  . Pulmonary edema 04/23/2015    Past Surgical History:  Procedure Laterality Date  . ABDOMINAL HYSTERECTOMY    . CHOLECYSTECTOMY    . ESOPHAGOGASTRODUODENOSCOPY (EGD) WITH PROPOFOL N/A 06/10/2016   Procedure: ESOPHAGOGASTRODUODENOSCOPY (EGD) WITH PROPOFOL;  Surgeon: Carman Ching, MD;  Location: [facility] ENDOSCOPY;  Service: Endoscopy;  Laterality: N/A;    OB History    No data available       Home Medications    Prior to Admission medications   Medication Sig Start Date End Date Taking? Authorizing Provider  albuterol (PROVENTIL HFA;VENTOLIN HFA) 108 (90 Base) MCG/ACT inhaler Inhale 2 puffs into the lungs every 6 (six) hours as needed for wheezing or shortness of breath. 07/17/16  Yes Josalyn Funches, MD  ammonium lactate (LAC-HYDRIN) 12 % cream Apply topically as needed for dry skin. 02/13/16  Yes Josalyn Funches, MD  fluticasone (FLONASE) 50 MCG/ACT nasal spray Place 2 sprays into both nostrils daily. Patient taking differently: Place 2 sprays into both nostrils daily as needed  for allergies.  11/18/13  Yes Robyn M Hess, PA-C  folic acid (FOLVITE) 1 MG tablet Take 1 tablet (1 mg total) by mouth daily. 07/17/16  Yes Josalyn Funches, MD  furosemide (LASIX) 40 MG tablet Take 1 tablet (40 mg total) by mouth daily. 07/17/16  Yes Josalyn Funches, MD  lactulose (CHRONULAC) 10 GM/15ML solution Take 30 mLs (20 g total) by mouth 2 (two) times daily. 07/17/16  Yes Josalyn Funches, MD  Multiple Vitamins-Minerals (MULTIVITAMIN) tablet Take 1 tablet by mouth daily. 07/17/16   Yes Josalyn Funches, MD  pantoprazole (PROTONIX) 40 MG tablet Take 1 tablet (40 mg total) by mouth daily before supper. 07/21/16  Yes Josalyn Funches, MD  potassium chloride (K-DUR,KLOR-CON) 10 MEQ tablet Take 10 mEq by mouth daily.   Yes Historical Provider, MD  spironolactone (ALDACTONE) 50 MG tablet Take 1 tablet (50 mg total) by mouth daily. 07/17/16  Yes Josalyn Funches, MD  thiamine 100 MG tablet Take 1 tablet (100 mg total) by mouth daily. 07/17/16  Yes Dessa PhiJosalyn Funches, MD    Family History Family History  Problem Relation Age of Onset  . Diabetes Mother   . Diabetes Daughter     Social History Social History  Substance Use Topics  . Smoking status: Former Smoker    Packs/day: 0.25    Years: 40.00    Types: Cigarettes    Quit date: 05/09/2015  . Smokeless tobacco: Never Used  . Alcohol use No     Comment: Patient drinks 6 40oz per day, 1 pint of liquor daily; Last drink 05/09/15     Allergies   Review of patient's allergies indicates no known allergies.   Review of Systems Review of Systems  Unable to perform ROS: Acuity of condition     Physical Exam Updated Vital Signs (NOT triage vitals) BP 116/61 (BP Location: Right Arm)   Pulse 92   Temp 97.4 F (36.3 C) (Oral)   Resp 16   Ht 5\' 11"  (1.803 m)   Wt (!) 328 lb (148.8 kg)   SpO2 100%   BMI 45.75 kg/m   Physical Exam  Constitutional: She appears well-developed and well-nourished. She appears listless. She has a sickly appearance. She appears ill. No distress.  HENT:  Head: Normocephalic and atraumatic.  Eyes: EOM are normal. Scleral icterus is present.  Neck: Normal range of motion.  Cardiovascular: Normal rate, regular rhythm, normal heart sounds and intact distal pulses.  Exam reveals no gallop and no friction rub.   No murmur heard. Pulmonary/Chest: Effort normal and breath sounds normal. No respiratory distress. She has no wheezes. She has no rales.  Abdominal: Soft. She exhibits no distension. There  is tenderness (RUQ). There is no guarding.  Genitourinary: Rectal exam shows guaiac positive stool.  Genitourinary Comments: melena  Musculoskeletal: She exhibits edema. She exhibits no tenderness.  Neurological: She appears listless. GCS eye subscore is 4. GCS verbal subscore is 5. GCS motor subscore is 6.  Skin: Skin is dry. No rash noted. She is not diaphoretic. No erythema.  Nursing note and vitals reviewed.    ED Treatments / Results  Labs (all labs ordered are listed, but only abnormal results are displayed) Labs Reviewed  AMMONIA - Abnormal; Notable for the following:       Result Value   Ammonia 116 (*)    All other components within normal limits  COMPREHENSIVE METABOLIC PANEL - Abnormal; Notable for the following:    Sodium 131 (*)    Potassium 6.6 (*)  CO2 15 (*)    BUN 63 (*)    Creatinine, Ser 3.23 (*)    Albumin 1.4 (*)    AST 250 (*)    ALT 82 (*)    Total Bilirubin 13.9 (*)    GFR calc non Af Amer 14 (*)    GFR calc Af Amer 17 (*)    All other components within normal limits  CBC WITH DIFFERENTIAL/PLATELET - Abnormal; Notable for the following:    WBC 11.5 (*)    RBC 1.47 (*)    Hemoglobin 5.6 (*)    HCT 15.8 (*)    MCV 107.5 (*)    MCH 38.1 (*)    RDW 21.0 (*)    Platelets 59 (*)    Neutro Abs 8.1 (*)    All other components within normal limits  PROTIME-INR - Abnormal; Notable for the following:    Prothrombin Time 45.1 (*)    INR 4.64 (*)    All other components within normal limits  I-STAT CHEM 8, ED - Abnormal; Notable for the following:    Sodium 134 (*)    Potassium 7.6 (*)    BUN 69 (*)    Creatinine, Ser 3.40 (*)    Calcium, Ion 1.08 (*)    Hemoglobin 7.1 (*)    HCT 21.0 (*)    All other components within normal limits  I-STAT CG4 LACTIC ACID, ED - Abnormal; Notable for the following:    Lactic Acid, Venous 6.81 (*)    All other components within normal limits  CBG MONITORING, ED - Abnormal; Notable for the following:     Glucose-Capillary 114 (*)    All other components within normal limits  BASIC METABOLIC PANEL  CBC  BASIC METABOLIC PANEL  MAGNESIUM  PHOSPHORUS  FIBRINOGEN  TROPONIN I  TROPONIN I  TROPONIN I  CBG MONITORING, ED  POC OCCULT BLOOD, ED  I-STAT VENOUS BLOOD GAS, ED  TYPE AND SCREEN  PREPARE RBC (CROSSMATCH)  PREPARE RBC (CROSSMATCH)    EKG  EKG Interpretation None       Radiology Dg Chest Portable 1 View  Result Date: 08/03/2016 CLINICAL DATA:  Acute onset of right lower quadrant abdominal pain and vomiting. Hematemesis. Initial encounter. EXAM: PORTABLE CHEST 1 VIEW COMPARISON:  None. FINDINGS: The lungs are well-aerated. Vascular congestion is noted. Increased interstitial markings may reflect mild interstitial edema. No significant pleural effusion or pneumothorax is seen. The cardiomediastinal silhouette is enlarged. No acute osseous abnormalities are seen. IMPRESSION: Vascular congestion and cardiomegaly. Increased interstitial markings may reflect mild interstitial edema. Electronically Signed   By: Roanna Raider M.D.   On: 08/03/2016 00:23    Procedures Procedures (including critical care time)  Medications Ordered in ED Medications  pantoprazole (PROTONIX) 80 mg in sodium chloride 0.9 % 250 mL (0.32 mg/mL) infusion (8 mg/hr Intravenous New Bag/Given 08/16/2016 2332)  sodium bicarbonate 150 mEq in dextrose 5 % 1,000 mL infusion ( Intravenous Incomplete 08/03/16 0124)  ondansetron (ZOFRAN) injection 4 mg (not administered)  albuterol (PROVENTIL) (2.5 MG/3ML) 0.083% nebulizer solution 2.5 mg (not administered)  thiamine (B-1) injection 100 mg (not administered)  folic acid injection 1 mg (not administered)  pantoprazole (PROTONIX) 80 mg in sodium chloride 0.9 % 100 mL IVPB (0 mg Intravenous Stopped 08/08/2016 2346)  ondansetron (ZOFRAN) injection 4 mg (4 mg Intravenous Given 08/19/2016 2301)  octreotide (SANDOSTATIN) 2 mcg/mL load via infusion 50 mcg (50 mcg Intravenous Bolus from  Bag 08/01/2016 2310)  cefTRIAXone (ROCEPHIN) 1 g  in dextrose 5 % 50 mL IVPB (0 g Intravenous Stopped 08/03/16 0028)  calcium gluconate 1 g in sodium chloride 0.9 % 100 mL IVPB (0 g Intravenous Stopped 08/27/2016 2351)  insulin aspart (novoLOG) injection 5 Units (5 Units Intravenous Given 08/27/16 2316)  dextrose 50 % solution 50 mL (50 mLs Intravenous Given Aug 27, 2016 2317)  0.9 %  sodium chloride infusion (10 mL/hr Intravenous New Bag/Given 08/03/16 0045)   Emergency Ultrasound:  US Guidance for needle guidance  Performed by Dr. Dalene Seltzer Indication: UGI bleed needs IV access  Linear probe used in real-time to visualize location of needle entry through skin. Interpretation: successful placement 16G IV   CRITICAL CARE: hemorrhagic shock, acute GI bleed requiring transfusion, hyperkalemia Performed by: Rhae Lerner   Total critical care time: 60 minutes  Critical care time was exclusive of separately billable procedures and treating other patients.  Critical care was necessary to treat or prevent imminent or life-threatening deterioration.  Critical care was time spent personally by me on the following activities: development of treatment plan with patient and/or surrogate as well as nursing, discussions with consultants, evaluation of patient's response to treatment, examination of patient, obtaining history from patient or surrogate, ordering and performing treatments and interventions, ordering and review of laboratory studies, ordering and review of radiographic studies, pulse oximetry and re-evaluation of patient's condition.   Initial Impression / Assessment and Plan / ED Course  I have reviewed the triage vital signs and the nursing notes.  Pertinent labs & imaging results that were available during my care of the patient were reviewed by me and considered in my medical decision making (see chart for details).  Clinical Course   60 year old female with history of cirrhosis, GI  bleeding with recent admission in June found to be likely acute duodenitis, presents with concern for hematemesis and abdominal pain.  Patient with blood pressures 70s and 80s systolic on arrival to the emergency department, gross melena on exam, and evidence of recent hematemesis with blood on her face.  IV access was established C 16, 18, and 220s. Normal saline was initiated as emergency release packed red blood cells were obtained given significant hypotension and evidence of GI bleed. Patient with abdominal pain, likely secondary to PUD, and given evidence of GI bleed and instability, do not feel emergent CT is indicated at this time.  Patient was given 2 units of emergency release blood.  Hemoglobin was found to be 5.6. Patient was given Protonix and octreotide bolus and infusion.  Rocephin for SBP prophylaxis.  Patient's vital signs improved with fluids and emergency blood.  Gastroenterology was contacted, and will see patient if she begins to actively have hematemesis, or if her vital signs worsen.  Patient with an INR 4.6, and FFP was ordered.  Patient's potassium was 7.6 on i-STAT, 6.6 and her CMP, and given initial elevation in potassium, was given calcium gluconate. EKG showed no acute hyperkalemic changes.  She was given insulin and dextrose.  Ammonia is also elevated, but will defer further treatment with lactulose to admitting team.  Patient is critically ill with an acute upper GI bleed, lactic acidosis, acute on chronic renal failure, hyperkalemia and will be admitted to critical care for further management.   Final Clinical Impressions(s) / ED Diagnoses   Final diagnoses:  AKI (acute kidney injury) (HCC)  Upper GI bleed  Lactic acidosis  Hyperkalemia  Hemorrhagic shock    New Prescriptions New Prescriptions   No medications on file  Alvira Monday, MD 08/03/16 0230

## 2016-08-03 NOTE — ED Notes (Signed)
Patient gown and ring sent with husband. Patient has no other belongings with her.

## 2016-08-03 NOTE — Care Management Note (Signed)
Case Management Note  Patient Details  Name: Emmit AlexandersJeane Cohenour MRN: [id number] Date of Birth: 07/25/1956  Subjective/Objective:                    Action/Plan: Recently discharge to [facility] - since that time pt has been discharged home to husband.  CM will continue to follow for discharge needs   Expected Discharge Date:                  Expected Discharge Plan:  Home w Home Health Services  In-House Referral:     Discharge planning Services  CM Consult  Post Acute Care Choice:    Choice offered to:     DME Arranged:    DME Agency:     HH Arranged:    HH Agency:     Status of Service:  In process, will continue to follow  If discussed at Long Length of Stay Meetings, dates discussed:    Additional Comments:  [name], Keslee Harrington S, RN 08/03/2016, 3:17 PM

## 2016-08-03 NOTE — Progress Notes (Signed)
PCCM Interval Progress Note  Events:  Persistent hypotension despite PRBC's and now up to 150 mcg neosynephrine.  Interventions:  Will switch out Neosynephrine for Levophed.   Rutherford Guysahul Desai, [place] - C [place] [facility] Pager: [phone]  or [phone] 08/03/2016, 10:20 PM

## 2016-08-03 NOTE — Procedures (Signed)
Central Venous Catheter Insertion Procedure Note Emmit AlexandersJeane Din [id number] 29-Apr-1956  Procedure: Insertion of Central Venous Catheter Indications: Assessment of intravascular volume, Drug and/or fluid administration and Frequent blood sampling  Procedure Details Consent: Risks of procedure as well as the alternatives and risks of each were explained to the (patient/caregiver).  Consent for procedure obtained. Time Out: Verified patient identification, verified procedure, site/side was marked, verified correct patient position, special equipment/implants available, medications/allergies/relevent history reviewed, required imaging and test results available.  Performed  Maximum sterile technique was used including antiseptics, cap, gloves, gown, hand hygiene, mask and sheet. Skin prep: Chlorhexidine; local anesthetic administered A antimicrobial bonded/coated triple lumen catheter was placed in the left internal jugular vein using the Seldinger technique.  Evaluation Blood flow good Complications: No apparent complications Patient did tolerate procedure well. Chest X-ray ordered to verify placement.  CXR: pending.  Procedure performed under direct ultrasound guidance for real time vessel cannulation.      Rutherford Guysahul Yeraldine Forney, [place] - C [place] [facility] Pager: [phone]  or [phone] 08/03/2016, 10:20 PM   \

## 2016-08-03 NOTE — ED Notes (Signed)
Patient cleaned up and bed changed. Patient had a large stool.

## 2016-08-03 NOTE — Progress Notes (Signed)
eLink Physician-Brief Progress Note Patient Name: Diane AlexandersJeane Cook DOB: 1956-10-17 MRN: [id number]   Date of Service  08/03/2016  HPI/Events of Note  Multiple issues: 1. Anemia - Hgb = 6.8 and 2. Request for Foley catheter.  eICU Interventions  Will order: 1. Transfuse 1 unit PRBC. 2. Place Foley catheter.     Intervention Category Intermediate Interventions: Bleeding - evaluation and treatment with blood products;Other:  [name],[name] 08/03/2016, 7:32 PM

## 2016-08-04 ENCOUNTER — Inpatient Hospital Stay (HOSPITAL_COMMUNITY): Payer: Medicaid Other

## 2016-08-04 DIAGNOSIS — K922 Gastrointestinal hemorrhage, unspecified: Secondary | ICD-10-CM

## 2016-08-04 DIAGNOSIS — K7290 Hepatic failure, unspecified without coma: Secondary | ICD-10-CM

## 2016-08-04 LAB — URINE MICROSCOPIC-ADD ON

## 2016-08-04 LAB — PREPARE FRESH FROZEN PLASMA
UNIT DIVISION: 0
UNIT DIVISION: 0
Unit division: 0
Unit division: 0

## 2016-08-04 LAB — URINALYSIS, ROUTINE W REFLEX MICROSCOPIC
GLUCOSE, UA: NEGATIVE mg/dL
Ketones, ur: 15 mg/dL — AB
Nitrite: POSITIVE — AB
PROTEIN: NEGATIVE mg/dL
SPECIFIC GRAVITY, URINE: 1.016 (ref 1.005–1.030)
pH: 5.5 (ref 5.0–8.0)

## 2016-08-04 LAB — BASIC METABOLIC PANEL
ANION GAP: 7 (ref 5–15)
BUN: 65 mg/dL — ABNORMAL HIGH (ref 6–20)
CALCIUM: 9 mg/dL (ref 8.9–10.3)
CO2: 23 mmol/L (ref 22–32)
CREATININE: 3.76 mg/dL — AB (ref 0.44–1.00)
Chloride: 102 mmol/L (ref 101–111)
GFR calc non Af Amer: 12 mL/min — ABNORMAL LOW (ref >60)
GFR, EST AFRICAN AMERICAN: 14 mL/min — AB (ref >60)
Glucose, Bld: 130 mg/dL — ABNORMAL HIGH (ref 65–99)
Potassium: 4.9 mmol/L (ref 3.5–5.1)
SODIUM: 132 mmol/L — AB (ref 135–145)

## 2016-08-04 LAB — PROTIME-INR
INR: 2.74
INR: 1.87
PROTHROMBIN TIME: 29.6 s — AB (ref 11.4–15.2)
PROTHROMBIN TIME: 21.8 s — AB (ref 11.4–15.2)

## 2016-08-04 LAB — CBC
HCT: 20.7 % — ABNORMAL LOW (ref 36.0–46.0)
HCT: 19.9 % — ABNORMAL LOW (ref 36.0–46.0)
HCT: 20.9 % — ABNORMAL LOW (ref 36.0–46.0)
HCT: 19.2 % — ABNORMAL LOW (ref 36.0–46.0)
HEMOGLOBIN: 7.2 g/dL — AB (ref 12.0–15.0)
HEMOGLOBIN: 6.9 g/dL — AB (ref 12.0–15.0)
HEMOGLOBIN: 7.1 g/dL — AB (ref 12.0–15.0)
Hemoglobin: 6.7 g/dL — CL (ref 12.0–15.0)
MCH: 33.5 pg (ref 26.0–34.0)
MCH: 34 pg (ref 26.0–34.0)
MCH: 32.6 pg (ref 26.0–34.0)
MCH: 34.4 pg — AB (ref 26.0–34.0)
MCHC: 34.8 g/dL (ref 30.0–36.0)
MCHC: 34.7 g/dL (ref 30.0–36.0)
MCHC: 34 g/dL (ref 30.0–36.0)
MCHC: 34.9 g/dL (ref 30.0–36.0)
MCV: 96.3 fL (ref 78.0–100.0)
MCV: 98 fL (ref 78.0–100.0)
MCV: 95.9 fL (ref 78.0–100.0)
MCV: 98.5 fL (ref 78.0–100.0)
PLATELETS: 55 10*3/uL — AB (ref 150–400)
PLATELETS: 76 10*3/uL — AB (ref 150–400)
Platelets: 64 10*3/uL — ABNORMAL LOW (ref 150–400)
Platelets: 85 10*3/uL — ABNORMAL LOW (ref 150–400)
RBC: 2.15 MIL/uL — ABNORMAL LOW (ref 3.87–5.11)
RBC: 2.03 MIL/uL — AB (ref 3.87–5.11)
RBC: 2.18 MIL/uL — ABNORMAL LOW (ref 3.87–5.11)
RBC: 1.95 MIL/uL — AB (ref 3.87–5.11)
RDW: 23.7 % — AB (ref 11.5–15.5)
RDW: 23.6 % — AB (ref 11.5–15.5)
RDW: 22.8 % — AB (ref 11.5–15.5)
RDW: 23.6 % — AB (ref 11.5–15.5)
WBC: 11.7 10*3/uL — AB (ref 4.0–10.5)
WBC: 11.4 10*3/uL — AB (ref 4.0–10.5)
WBC: 10.2 10*3/uL (ref 4.0–10.5)
WBC: 9.1 10*3/uL (ref 4.0–10.5)

## 2016-08-04 LAB — GLUCOSE, CAPILLARY: Glucose-Capillary: 131 mg/dL — ABNORMAL HIGH (ref 65–99)

## 2016-08-04 LAB — AMMONIA: Ammonia: 56 umol/L — ABNORMAL HIGH (ref 9–35)

## 2016-08-04 LAB — TROPONIN I
Troponin I: 0.05 ng/mL (ref <0.03)
Troponin I: 0.06 ng/mL (ref <0.03)

## 2016-08-04 LAB — SODIUM, URINE, RANDOM: Sodium, Ur: 11 mmol/L

## 2016-08-04 LAB — PREPARE RBC (CROSSMATCH)

## 2016-08-04 LAB — CREATININE, URINE, RANDOM: CREATININE, URINE: 187.96 mg/dL

## 2016-08-04 MED ORDER — SODIUM CHLORIDE 0.9 % IV SOLN
Freq: Once | INTRAVENOUS | Status: AC
  Administered 2016-08-04: 22:00:00 via INTRAVENOUS

## 2016-08-04 MED ORDER — HALOPERIDOL LACTATE 5 MG/ML IJ SOLN
5.0000 mg | INTRAMUSCULAR | Status: AC
  Administered 2016-08-04: 5 mg via INTRAVENOUS
  Filled 2016-08-04: qty 1

## 2016-08-04 MED ORDER — HALOPERIDOL LACTATE 5 MG/ML IJ SOLN
INTRAMUSCULAR | Status: AC
  Filled 2016-08-04: qty 1

## 2016-08-04 MED ORDER — VASOPRESSIN 20 UNIT/ML IV SOLN
0.0300 [IU]/min | INTRAVENOUS | Status: DC
  Administered 2016-08-04: 0.03 [IU]/min via INTRAVENOUS
  Filled 2016-08-04: qty 2

## 2016-08-04 MED ORDER — SODIUM POLYSTYRENE SULFONATE 15 GM/60ML PO SUSP
30.0000 g | Freq: Once | ORAL | Status: DC
  Filled 2016-08-04: qty 120

## 2016-08-04 MED ORDER — NOREPINEPHRINE BITARTRATE 1 MG/ML IV SOLN
2.0000 ug/min | INTRAVENOUS | Status: DC
  Administered 2016-08-04: 25 ug/min via INTRAVENOUS
  Administered 2016-08-05 – 2016-08-06 (×2): 12 ug/min via INTRAVENOUS
  Administered 2016-08-07: 28 ug/min via INTRAVENOUS
  Administered 2016-08-07: 26 ug/min via INTRAVENOUS
  Administered 2016-08-08 (×2): 28 ug/min via INTRAVENOUS
  Administered 2016-08-09: 31 ug/min via INTRAVENOUS
  Administered 2016-08-09: 40 ug/min via INTRAVENOUS
  Filled 2016-08-04 (×10): qty 16

## 2016-08-04 MED ORDER — SODIUM CHLORIDE 0.9 % IV SOLN
Freq: Once | INTRAVENOUS | Status: AC
  Administered 2016-08-04: 12:00:00 via INTRAVENOUS

## 2016-08-04 MED ORDER — SODIUM CHLORIDE 0.9 % IV SOLN
Freq: Once | INTRAVENOUS | Status: AC
  Administered 2016-08-04: 21:00:00 via INTRAVENOUS

## 2016-08-04 MED ORDER — SODIUM CHLORIDE 0.9 % IV SOLN: Freq: Once | INTRAVENOUS | Status: AC

## 2016-08-04 MED ORDER — SODIUM POLYSTYRENE SULFONATE 15 GM/60ML PO SUSP
30.0000 g | Freq: Once | ORAL | Status: AC
  Administered 2016-08-04: 30 g via RECTAL
  Filled 2016-08-04: qty 120

## 2016-08-04 MED ORDER — HALOPERIDOL LACTATE 5 MG/ML IJ SOLN
1.0000 mg | INTRAMUSCULAR | Status: DC | PRN
Start: 2016-08-04 — End: 2016-08-06
  Administered 2016-08-04 – 2016-08-05 (×2): 4 mg via INTRAVENOUS
  Filled 2016-08-04 (×2): qty 1

## 2016-08-04 NOTE — Progress Notes (Signed)
eLink Physician-Brief Progress Note Patient Name: Emmit AlexandersJeane Wakeley DOB: 11-22-1956 MRN: [id number]     Recent Labs Lab 08/03/16 0231 08/03/16 1758 08/03/16 2300  TROPONINI 0.07* <0.03 0.05*     Recent Labs Lab 08/20/2016 2253 08/07/2016 2305 08/03/16 0231 08/03/16 1030 08/03/16 1902  K 6.6* 7.6* 6.5* 6.4* 5.5*    Recent Labs Lab 08/23/2016 2253 08/03/16 1902  INR 4.64* 2.45    Recent Labs Lab 07/31/2016 2253 08/15/2016 2305 08/03/16 0231 08/03/16 1030 08/03/16 1902  CREATININE 3.23* 3.40* 3.08* 3.48* 3.76*      A) Trop - small bumps - monitor hig K  Improving - rpt kayexalate INR - improved    Intervention Category Major Interventions: Delirium, psychosis, severe agitation - evaluation and management Intermediate Interventions: Diagnostic test evaluation  Tonjia Parillo 08/04/2016, 12:36 AM

## 2016-08-04 NOTE — Progress Notes (Signed)
PULMONARY / CRITICAL CARE MEDICINE   Name: Diane Cook MRN: [id number] DOB: 1956/05/09    ADMISSION DATE:  08/23/2016 CONSULTATION DATE:  08/06/2016  REFERRING MD:  Dalene Seltzer - EDP  CHIEF COMPLAINT:  Abd pain, dark stools  HISTORY OF PRESENT ILLNESS:   Diane Cook is a 60 y.o. female with PMH  including known ETOH cirrhosis, chronic hep C.  She had recent admission 06/06/16 through 06/12/16 for GI bleed.  Had EGD 6/14 which revealed duodenitis, no varices or ulcers.  She was discharged and instructed to have outpatient follow up with GI.  On 8/6, she presented again to [facility] ED with melena and hematemesis x 1 along with RLQ abd pain.  Symptoms had started 2 - 3 days earlier and persisted.  In ED, she had 1 episode of melena but no hematemesis or hematochezia.  Hgb was 5.6, she was started on PRBC transfusion.  In addition, she had AKI with hyperkalemia   SUBJECTIVE:    No chest pain. Or dyspnea Dark stool  afebrile  VITAL SIGNS: BP 116/74   Pulse 97   Temp 97.8 F (36.6 C) (Oral)   Resp 14   Ht 5\' 11"  (1.803 m)   Wt (!) 348 lb (157.9 kg)   SpO2 100%   BMI 48.54 kg/m   HEMODYNAMICS:    VENTILATOR SETTINGS:    INTAKE / OUTPUT: I/O last 3 completed shifts: In: 7181.3 [I.V.:4041; Blood:3090.3; IV Piggyback:50] Out: 560 [Urine:560]   PHYSICAL EXAMINATION: General: Obese female, in NAD. Neuro: Lethargic A&O x 2 to place & name, non-focal HEENT: [place]/AT. PERRL, sclerae anicteric.   Cardiovascular: RRR, 3/6 SEM.  Lungs: Respirations even and unlabored.  CTA bilaterally, No W/R/R. Abdomen: Obese, BS x 4, soft, tender RLQ. Musculoskeletal: No gross deformities, 1+ non pitting edema.  Skin: Intact, warm, no rashes.  LABS:  BMET  Recent Labs Lab 08/03/16 1030 08/03/16 1902 08/04/16 0545  NA 133* 134* 132*  K 6.4* 5.5* 4.9  CL 107 106 102  CO2 17* 21* 23  BUN 59* 63* 65*  CREATININE 3.48* 3.76* 3.76*  GLUCOSE 105* 97 130*    Electrolytes  Recent Labs Lab  08/03/16 1030 08/03/16 1902 08/04/16 0545  CALCIUM 9.1 9.3 9.0    CBC  Recent Labs Lab 08/03/16 1902 08/04/16 0545 08/04/16 1100  WBC 10.4 11.7* 11.4*  HGB 6.8* 7.2* 6.9*  HCT 19.5* 20.7* 19.9*  PLT 60* 64* PENDING    Coag's  Recent Labs Lab 08/23/2016 2253 08/03/16 1902 08/04/16 0545  INR 4.64* 2.45 2.74    Sepsis Markers  Recent Labs Lab 08/22/2016 2305 08/03/16 0633  LATICACIDVEN 6.81* 3.7*    ABG No results for input(s): PHART, PCO2ART, PO2ART in the last 168 hours.  Liver Enzymes  Recent Labs Lab 08/11/2016 2253  AST 250*  ALT 82*  ALKPHOS 113  BILITOT 13.9*  ALBUMIN 1.4*    Cardiac Enzymes  Recent Labs Lab 08/03/16 1758 08/03/16 2300 08/04/16 0545  TROPONINI <0.03 0.05* 0.06*    Glucose  Recent Labs Lab 07/28/2016 2202 08/18/2016 2347 08/03/16 0623 08/03/16 1603  GLUCAP 70 114* 88 79    Imaging Dg Chest Port 1 View  Result Date: 08/04/2016 CLINICAL DATA:  Status post replacement of central line. Initial encounter. EXAM: PORTABLE CHEST 1 VIEW COMPARISON:  Chest radiograph performed 08/03/2016 FINDINGS: The patient's left-sided central line is noted ending about the mid SVC. Line Vascular congestion is noted, with increased interstitial markings, concerning for pulmonary edema. No pleural effusion or pneumothorax  is seen. The cardiomediastinal silhouette is enlarged. No acute osseous abnormalities are identified. IMPRESSION: 1. Left-sided central line noted ending about the mid SVC. 2. Vascular congestion and cardiomegaly, with increased interstitial markings, concerning for pulmonary edema. Electronically Signed   By: Roanna RaiderJeffery  Chang M.D.   On: 08/04/2016 05:34   Dg Chest Port 1 View  Result Date: 08/03/2016 CLINICAL DATA:  Central line placement.  Initial encounter. EXAM: PORTABLE CHEST 1 VIEW COMPARISON:  Chest radiograph performed 08/01/2016 FINDINGS: The patient's left IJ line is noted ending about the mid SVC. Vascular congestion is  noted. Increased interstitial lung markings may reflect mild interstitial edema. No pleural effusion or pneumothorax is seen The cardiomediastinal silhouette is enlarged. No acute osseous abnormalities are identified. IMPRESSION: 1. Left IJ line noted ending about the mid SVC. 2. Vascular congestion and cardiomegaly. Increased interstitial lung markings may reflect mild interstitial edema. Electronically Signed   By: Roanna RaiderJeffery  Chang M.D.   On: 08/03/2016 22:58   Dg Abd Portable 1v  Result Date: 08/04/2016 CLINICAL DATA:  60 year old female with a history of nasogastric tube placement. EXAM: PORTABLE ABDOMEN - 1 VIEW COMPARISON:  Chest x-ray 08/04/2016 FINDINGS: Limited plain film of the abdomen with exclusion of the majority of the right abdomen. Gastric tube terminates just to the right of the midline with the side port projecting over the spine. Tip appears to terminate in the region of the pylorus. Gas within small bowel and colon. IMPRESSION: Limited plain film demonstrates gastric tube terminating in the region of the pylorus. Signed, Yvone NeuJaime S. Loreta AveWagner, DO [facility] Electronically Signed   By: Gilmer MorJaime  Wagner D.O.   On: 08/04/2016 11:56     STUDIES:  CXR 8/7 > mild edema.  CULTURES: None.  ANTIBIOTICS: None.  SIGNIFICANT EVENTS: 8/7 > admit for GI bleed, presumed upper.  LINES/TUBES: LIJ 8/7 >>.  DISCUSSION: 60 y.o. F with hx ETOH cirrhosis and hep c, had recent UGIB in June 2017 due to duodenitis.  Admitted again 8/7 with GI bleed, presumed upper again.  ASSESSMENT / PLAN:  GASTROINTESTINAL A:   UGIB - presumed upper.  Had admission June 2017 where EGD revealed duodenitis. Hx ETOH cirrhosis, HCV. Nutrition. P:   Continue PPI gtt. GI Following- defer EGD based on findings of duodenitis not too long ago Consider IR if bleeding persists NPO.  HEMATOLOGIC A:   AoC anemia - exacerbated due to acute UGIB. S/p 4 U  PRBC Chronic thrombocytopenia - likely due to bone marrow suppression from ETOH. Coagulopathy - likely due to liver dysfunction. VTE Prophylaxis. P:  Continue PRBC transfusions to maintain Hgb > 7. Give more vitamin K and 4 units FFP & rechk INR SCD's.   RENAL A:   Hyponatremia. Hyperkalemia - improved with calcium, bicarbonate and Kayexalate AoCKD - presumed due to decreased PO intake + UGIB + N/V. NAGMA. P:   Ct HCO3 @ 100.   CARDIOVASCULAR A:  Hemorrhagic shock - Likely due to GI bleed + decreased PO intake.  P:  Continue PRBC transfusion. Ct levophed + vaso gtt Follow CVP   PULMONARY A: Hx asthma. Probable OSA / OHS. P:   Pulmonary hygiene.  INFECTIOUS A:   No indication of infection. P:   Monitor clinically.  ENDOCRINE A:   No acute issues.   P:   No interventions required.  NEUROLOGIC A:  Hepatic encephalopathy Hx ETOH abuse. P:   Thiamine / folate. ETOH cessation counseling. Lactulose - titrate to 3 soft BMs/d  Family updated: Husband and daughter updated at bedside.  Interdisciplinary Family Meeting v Palliative Care Meeting:  Due by: 08/17/2016.  CC time: 35 minutes.   Cyril Mourning MD. Tonny Bollman. [place] Pager [phone] If no response call [phone]    08/04/2016, 12:22 PM

## 2016-08-04 NOTE — Progress Notes (Signed)
eLink Physician-Brief Progress Note Patient Name: Diane AlexandersJeane Keiffer DOB: Nov 28, 1956 MRN: [id number]   1st haldol effect wore off and now agitateda gain CVL might be displaced  Plan Repeat haldol 5mg  Check cxr for cvl position  Check ekg    Intervention Category Major Interventions: Delirium, psychosis, severe agitation - evaluation and management  Yukiko Minnich 08/04/2016, 4:54 AM

## 2016-08-04 NOTE — Progress Notes (Signed)
eLink Physician-Brief Progress Note Patient Name: Diane AlexandersJeane Cook DOB: 1956/07/29 MRN: [id number]   Date of Service  08/04/2016  HPI/Events of Note  Anemia - GI bleeding. Hgb = 6.6. Multiple tarry stools today.   eICU Interventions  Will transfuse 2 units PRBC when available.      Intervention Category Major Interventions: Hemorrhage - evaluation and management  Sommer,Steven Dennard Nipugene 08/04/2016, 7:56 PM

## 2016-08-04 NOTE — Progress Notes (Signed)
[facility] Progress Note  Subjective: Patient has been agitated overnight. Receiving Haldol. Suspect agitation is related to combination of alcohol withdrawal and hepatic encephalopathy. Hemoglobin 7.2 this morning. Most recent INR somewhat improved after FFP but still quite elevated.  Objective: Vital signs in last 24 hours: Temp:  [96.7 F (35.9 C)-98.6 F (37 C)] 98.6 F (37 C) (08/08 0834) Pulse Rate:  [77-98] 88 (08/08 0852) Resp:  [0-25] 10 (08/08 0852) BP: (57-133)/(31-70) 102/61 (08/08 0852) SpO2:  [91 %-100 %] 100 % (08/08 0852) Weight:  [157.9 kg (348 lb)] 157.9 kg (348 lb) (08/08 0240) Weight change: 9.072 kg (20 lb)   PE: No distress  Heart regular rhythm  Abdomen nontender  Lab Results: Results for orders placed or performed during the hospital encounter of 08/26/2016 (from the past 24 hour(s))  Basic metabolic panel     Status: Abnormal   Collection Time: 08/03/16 10:30 AM  Result Value Ref Range   Sodium 133 (L) 135 - 145 mmol/L   Potassium 6.4 (HH) 3.5 - 5.1 mmol/L   Chloride 107 101 - 111 mmol/L   CO2 17 (L) 22 - 32 mmol/L   Glucose, Bld 105 (H) 65 - 99 mg/dL   BUN 59 (H) 6 - 20 mg/dL   Creatinine, Ser 0.98 (H) 0.44 - 1.00 mg/dL   Calcium 9.1 8.9 - 11.9 mg/dL   GFR calc non Af Amer 13 (L) >60 mL/min   GFR calc Af Amer 15 (L) >60 mL/min   Anion gap 9 5 - 15  Prepare fresh frozen plasma     Status: None   Collection Time: 08/03/16 10:38 AM  Result Value Ref Range   Unit Number [id number]    Blood Component Type THAWED PLASMA    Unit division 00    Status of Unit ISSUED,FINAL    Transfusion Status OK TO TRANSFUSE    Unit Number [id number]    Blood Component Type THAWED PLASMA    Unit division 00    Status of Unit ISSUED,FINAL    Transfusion Status OK TO TRANSFUSE    Unit Number [id number]    Blood Component Type THAWED PLASMA    Unit division 00    Status of Unit ISSUED,FINAL    Transfusion Status OK TO TRANSFUSE    Unit  Number [id number]    Blood Component Type THAWED PLASMA    Unit division 00    Status of Unit ISSUED,FINAL    Transfusion Status OK TO TRANSFUSE   CBC     Status: Abnormal   Collection Time: 08/03/16 11:43 AM  Result Value Ref Range   WBC 10.1 4.0 - 10.5 K/uL   RBC 2.19 (L) 3.87 - 5.11 MIL/uL   Hemoglobin 7.6 (L) 12.0 - 15.0 g/dL   HCT 56.3 (L) 87.5 - 64.3 %   MCV 97.3 78.0 - 100.0 fL   MCH 34.7 (H) 26.0 - 34.0 pg   MCHC 35.7 30.0 - 36.0 g/dL   RDW 32.9 (H) 51.8 - 84.1 %   Platelets 62 (L) 150 - 400 K/uL  BLOOD TRANSFUSION REPORT - SCANNED     Status: None   Collection Time: 08/03/16 11:58 AM   Narrative   Ordered by an unspecified provider.  Glucose, capillary     Status: None   Collection Time: 08/03/16  4:03 PM  Result Value Ref Range   Glucose-Capillary 79 65 - 99 mg/dL   Comment 1 Notify RN    Comment 2 Document in Chart  Troponin I (q 6hr x 3)     Status: None   Collection Time: 08/03/16  5:58 PM  Result Value Ref Range   Troponin I <0.03 <0.03 ng/mL  Basic metabolic panel     Status: Abnormal   Collection Time: 08/03/16  7:02 PM  Result Value Ref Range   Sodium 134 (L) 135 - 145 mmol/L   Potassium 5.5 (H) 3.5 - 5.1 mmol/L   Chloride 106 101 - 111 mmol/L   CO2 21 (L) 22 - 32 mmol/L   Glucose, Bld 97 65 - 99 mg/dL   BUN 63 (H) 6 - 20 mg/dL   Creatinine, Ser 4.78 (H) 0.44 - 1.00 mg/dL   Calcium 9.3 8.9 - 29.5 mg/dL   GFR calc non Af Amer 12 (L) >60 mL/min   GFR calc Af Amer 14 (L) >60 mL/min   Anion gap 7 5 - 15  Protime-INR     Status: Abnormal   Collection Time: 08/03/16  7:02 PM  Result Value Ref Range   Prothrombin Time 27.0 (H) 11.4 - 15.2 seconds   INR 2.45   CBC with Differential/Platelet     Status: Abnormal   Collection Time: 08/03/16  7:02 PM  Result Value Ref Range   WBC 10.4 4.0 - 10.5 K/uL   RBC 1.99 (L) 3.87 - 5.11 MIL/uL   Hemoglobin 6.8 (LL) 12.0 - 15.0 g/dL   HCT 62.1 (L) 30.8 - 65.7 %   MCV 98.0 78.0 - 100.0 fL   MCH 34.2 (H) 26.0 -  34.0 pg   MCHC 34.9 30.0 - 36.0 g/dL   RDW 84.6 (H) 96.2 - 95.2 %   Platelets 60 (L) 150 - 400 K/uL   Neutrophils Relative % 61 %   Lymphocytes Relative 18 %   Monocytes Relative 12 %   Eosinophils Relative 8 %   Basophils Relative 1 %   Neutro Abs 6.4 1.7 - 7.7 K/uL   Lymphs Abs 1.9 0.7 - 4.0 K/uL   Monocytes Absolute 1.2 (H) 0.1 - 1.0 K/uL   Eosinophils Absolute 0.8 (H) 0.0 - 0.7 K/uL   Basophils Absolute 0.1 0.0 - 0.1 K/uL   RBC Morphology POLYCHROMASIA PRESENT   Prepare RBC     Status: None   Collection Time: 08/03/16  7:32 PM  Result Value Ref Range   Order Confirmation ORDER PROCESSED BY BLOOD BANK   Troponin I (q 6hr x 3)     Status: Abnormal   Collection Time: 08/03/16 11:00 PM  Result Value Ref Range   Troponin I 0.05 (HH) <0.03 ng/mL  Troponin I (q 6hr x 3)     Status: Abnormal   Collection Time: 08/04/16  5:45 AM  Result Value Ref Range   Troponin I 0.06 (HH) <0.03 ng/mL  Protime-INR     Status: Abnormal   Collection Time: 08/04/16  5:45 AM  Result Value Ref Range   Prothrombin Time 29.6 (H) 11.4 - 15.2 seconds   INR 2.74   CBC     Status: Abnormal   Collection Time: 08/04/16  5:45 AM  Result Value Ref Range   WBC 11.7 (H) 4.0 - 10.5 K/uL   RBC 2.15 (L) 3.87 - 5.11 MIL/uL   Hemoglobin 7.2 (L) 12.0 - 15.0 g/dL   HCT 84.1 (L) 32.4 - 40.1 %   MCV 96.3 78.0 - 100.0 fL   MCH 33.5 26.0 - 34.0 pg   MCHC 34.8 30.0 - 36.0 g/dL   RDW 02.7 (H) 25.3 -  15.5 %   Platelets 64 (L) 150 - 400 K/uL  Basic metabolic panel     Status: Abnormal   Collection Time: 08/04/16  5:45 AM  Result Value Ref Range   Sodium 132 (L) 135 - 145 mmol/L   Potassium 4.9 3.5 - 5.1 mmol/L   Chloride 102 101 - 111 mmol/L   CO2 23 22 - 32 mmol/L   Glucose, Bld 130 (H) 65 - 99 mg/dL   BUN 65 (H) 6 - 20 mg/dL   Creatinine, Ser 0.983.76 (H) 0.44 - 1.00 mg/dL   Calcium 9.0 8.9 - 11.910.3 mg/dL   GFR calc non Af Amer 12 (L) >60 mL/min   GFR calc Af Amer 14 (L) >60 mL/min   Anion gap 7 5 - 15  Prepare  Pheresed Platelets     Status: None (Preliminary result)   Collection Time: 08/04/16  9:52 AM  Result Value Ref Range   Unit Number [id number]    Blood Component Type PLTP LR1 PAS    Unit division 00    Status of Unit ALLOCATED    Transfusion Status OK TO TRANSFUSE   Prepare RBC     Status: None   Collection Time: 08/04/16  9:54 AM  Result Value Ref Range   Order Confirmation ORDER PROCESSED BY BLOOD BANK     Studies/Results: Dg Chest Port 1 View  Result Date: 08/04/2016 CLINICAL DATA:  Status post replacement of central line. Initial encounter. EXAM: PORTABLE CHEST 1 VIEW COMPARISON:  Chest radiograph performed 08/03/2016 FINDINGS: The patient's left-sided central line is noted ending about the mid SVC. Line Vascular congestion is noted, with increased interstitial markings, concerning for pulmonary edema. No pleural effusion or pneumothorax is seen. The cardiomediastinal silhouette is enlarged. No acute osseous abnormalities are identified. IMPRESSION: 1. Left-sided central line noted ending about the mid SVC. 2. Vascular congestion and cardiomegaly, with increased interstitial markings, concerning for pulmonary edema. Electronically Signed   By: Roanna RaiderJeffery  Chang M.D.   On: 08/04/2016 05:34   Dg Chest Port 1 View  Result Date: 08/03/2016 CLINICAL DATA:  Central line placement.  Initial encounter. EXAM: PORTABLE CHEST 1 VIEW COMPARISON:  Chest radiograph performed 08/16/2016 FINDINGS: The patient's left IJ line is noted ending about the mid SVC. Vascular congestion is noted. Increased interstitial lung markings may reflect mild interstitial edema. No pleural effusion or pneumothorax is seen The cardiomediastinal silhouette is enlarged. No acute osseous abnormalities are identified. IMPRESSION: 1. Left IJ line noted ending about the mid SVC. 2. Vascular congestion and cardiomegaly. Increased interstitial lung markings may reflect mild interstitial edema. Electronically Signed   By: Roanna RaiderJeffery  Chang  M.D.   On: 08/03/2016 22:58      Assessment: #1. Alcoholic/hepatitis C cirrhosis of liver  #2. GI bleed with recent endoscopy showing duodenitis with erosions and no evidence of esophageal varices  #3. Coagulopathy secondary to #1  #4. Thrombocytopenia secondary to #1 as well as bone marrow suppression from continued alcohol use  #5. Hepatic encephalopathy  #. Jaundice secondary to #1  Plan:   Continue supportive care and current management.    SAM F Maika Mcelveen 08/04/2016, 10:12 AM  Pager: [phone] If no answer or after 5 PM call [phone] Lab Results  Component Value Date   HGB 7.2 (L) 08/04/2016   HGB 6.8 (LL) 08/03/2016   HGB 7.6 (L) 08/03/2016   HCT 20.7 (L) 08/04/2016   HCT 19.5 (L) 08/03/2016   HCT 21.3 (L) 08/03/2016   ALKPHOS 113 08/19/2016  ALKPHOS 61 06/10/2016   ALKPHOS 66 06/09/2016   AST 250 (H) 07/28/2016   AST 49 (H) 06/10/2016   AST 53 (H) 06/09/2016   ALT 82 (H) 07/30/2016   ALT 23 06/10/2016   ALT 23 06/09/2016

## 2016-08-04 NOTE — Progress Notes (Signed)
eLink Physician-Brief Progress Note Patient Name: Emmit AlexandersJeane Dejarnett DOB: 10-04-1956 MRN: [id number]   Date of Service  08/04/2016  HPI/Events of Note  Coagulopathy - INR = 1.87. In setting of ongoing slow GI bleed.  eICU Interventions  Will transfuse 2 units of FFP when available.      Intervention Category Intermediate Interventions: Coagulopathy - evaluation and management  Sommer,Steven Eugene 08/04/2016, 8:22 PM

## 2016-08-04 NOTE — Progress Notes (Signed)
eLink Physician-Brief Progress Note Patient Name: Diane AlexandersJeane Cook DOB: [birth year] MRN: [id number]   Date of Service  08/04/2016  HPI/Events of Note  Agitated delirium. RASS +2. QTc on ekg < 500msec 08/03/16  eICU Interventions  Haldol 5mg  IV x 1     Intervention Category Major Interventions: Delirium, psychosis, severe agitation - evaluation and management  Yisel Megill 08/04/2016, 12:32 AM

## 2016-08-05 ENCOUNTER — Encounter (HOSPITAL_COMMUNITY): Admission: EM | Disposition: E | Payer: Self-pay | Source: Home / Self Care | Attending: Emergency Medicine

## 2016-08-05 ENCOUNTER — Other Ambulatory Visit: Payer: Medicaid Other

## 2016-08-05 ENCOUNTER — Encounter (HOSPITAL_COMMUNITY): Payer: Self-pay | Admitting: *Deleted

## 2016-08-05 DIAGNOSIS — N179 Acute kidney failure, unspecified: Secondary | ICD-10-CM

## 2016-08-05 HISTORY — PX: ESOPHAGOGASTRODUODENOSCOPY: SHX5428

## 2016-08-05 LAB — PREPARE PLATELET PHERESIS: UNIT DIVISION: 0

## 2016-08-05 LAB — CBC
HCT: 20.9 % — ABNORMAL LOW (ref 36.0–46.0)
HCT: 19.9 % — ABNORMAL LOW (ref 36.0–46.0)
HEMOGLOBIN: 7.1 g/dL — AB (ref 12.0–15.0)
Hemoglobin: 6.7 g/dL — CL (ref 12.0–15.0)
MCH: 32.3 pg (ref 26.0–34.0)
MCH: 32.2 pg (ref 26.0–34.0)
MCHC: 34 g/dL (ref 30.0–36.0)
MCHC: 33.7 g/dL (ref 30.0–36.0)
MCV: 95 fL (ref 78.0–100.0)
MCV: 95.7 fL (ref 78.0–100.0)
PLATELETS: 65 10*3/uL — AB (ref 150–400)
Platelets: 65 10*3/uL — ABNORMAL LOW (ref 150–400)
RBC: 2.2 MIL/uL — ABNORMAL LOW (ref 3.87–5.11)
RBC: 2.08 MIL/uL — AB (ref 3.87–5.11)
RDW: 21.8 % — AB (ref 11.5–15.5)
RDW: 21.9 % — AB (ref 11.5–15.5)
WBC: 8.9 10*3/uL (ref 4.0–10.5)
WBC: 10.6 10*3/uL — AB (ref 4.0–10.5)

## 2016-08-05 LAB — PREPARE FRESH FROZEN PLASMA
UNIT DIVISION: 0
UNIT DIVISION: 0
Unit division: 0
Unit division: 0

## 2016-08-05 LAB — COMPREHENSIVE METABOLIC PANEL
ALK PHOS: 75 U/L (ref 38–126)
ALT: 58 U/L — ABNORMAL HIGH (ref 14–54)
ANION GAP: 9 (ref 5–15)
AST: 147 U/L — ABNORMAL HIGH (ref 15–41)
Albumin: 2 g/dL — ABNORMAL LOW (ref 3.5–5.0)
BUN: 64 mg/dL — ABNORMAL HIGH (ref 6–20)
CALCIUM: 8.9 mg/dL (ref 8.9–10.3)
CO2: 26 mmol/L (ref 22–32)
Chloride: 100 mmol/L — ABNORMAL LOW (ref 101–111)
Creatinine, Ser: 3.19 mg/dL — ABNORMAL HIGH (ref 0.44–1.00)
GFR calc non Af Amer: 15 mL/min — ABNORMAL LOW (ref >60)
GFR, EST AFRICAN AMERICAN: 17 mL/min — AB (ref >60)
Glucose, Bld: 120 mg/dL — ABNORMAL HIGH (ref 65–99)
POTASSIUM: 4.1 mmol/L (ref 3.5–5.1)
SODIUM: 135 mmol/L (ref 135–145)
TOTAL PROTEIN: 6.8 g/dL (ref 6.5–8.1)
Total Bilirubin: 16 mg/dL — ABNORMAL HIGH (ref 0.3–1.2)

## 2016-08-05 LAB — PHOSPHORUS: PHOSPHORUS: 5.1 mg/dL — AB (ref 2.5–4.6)

## 2016-08-05 LAB — GLUCOSE, CAPILLARY
GLUCOSE-CAPILLARY: 120 mg/dL — AB (ref 65–99)
Glucose-Capillary: 118 mg/dL — ABNORMAL HIGH (ref 65–99)

## 2016-08-05 LAB — MAGNESIUM: Magnesium: 1.9 mg/dL (ref 1.7–2.4)

## 2016-08-05 LAB — PREPARE RBC (CROSSMATCH)

## 2016-08-05 SURGERY — EGD (ESOPHAGOGASTRODUODENOSCOPY)
Anesthesia: Moderate Sedation

## 2016-08-05 MED ORDER — PANTOPRAZOLE SODIUM 40 MG IV SOLR
40.0000 mg | Freq: Two times a day (BID) | INTRAVENOUS | Status: DC
  Administered 2016-08-06: 40 mg via INTRAVENOUS
  Filled 2016-08-05: qty 40

## 2016-08-05 MED ORDER — FENTANYL CITRATE (PF) 100 MCG/2ML IJ SOLN
INTRAMUSCULAR | Status: DC | PRN
  Administered 2016-08-05: 25 ug via INTRAVENOUS

## 2016-08-05 MED ORDER — SODIUM CHLORIDE 0.9 % IV SOLN: INTRAVENOUS | Status: DC

## 2016-08-05 MED ORDER — BUTAMBEN-TETRACAINE-BENZOCAINE 2-2-14 % EX AERO
INHALATION_SPRAY | CUTANEOUS | Status: DC | PRN
  Administered 2016-08-05: 2 via TOPICAL

## 2016-08-05 MED ORDER — LACTULOSE 10 GM/15ML PO SOLN
20.0000 g | Freq: Two times a day (BID) | ORAL | Status: DC
  Administered 2016-08-06 – 2016-08-08 (×4): 20 g via ORAL
  Filled 2016-08-05 (×10): qty 30

## 2016-08-05 MED ORDER — DIPHENHYDRAMINE HCL 50 MG/ML IJ SOLN
INTRAMUSCULAR | Status: AC
  Filled 2016-08-05: qty 1

## 2016-08-05 MED ORDER — MIDAZOLAM HCL 5 MG/ML IJ SOLN
INTRAMUSCULAR | Status: AC
  Filled 2016-08-05: qty 2

## 2016-08-05 MED ORDER — FENTANYL CITRATE (PF) 100 MCG/2ML IJ SOLN
INTRAMUSCULAR | Status: AC
  Filled 2016-08-05: qty 2

## 2016-08-05 MED ORDER — MIDAZOLAM HCL 10 MG/2ML IJ SOLN
INTRAMUSCULAR | Status: DC | PRN
  Administered 2016-08-05 (×2): 1 mg via INTRAVENOUS
  Administered 2016-08-05: 2 mg via INTRAVENOUS

## 2016-08-05 MED ORDER — VITAMIN K1 10 MG/ML IJ SOLN
10.0000 mg | Freq: Once | INTRAVENOUS | Status: AC
  Administered 2016-08-05: 10 mg via INTRAVENOUS
  Filled 2016-08-05: qty 1

## 2016-08-05 MED ORDER — SODIUM CHLORIDE 0.9 % IV SOLN
Freq: Once | INTRAVENOUS | Status: AC
  Administered 2016-08-05: 500 mL via INTRAVENOUS

## 2016-08-05 MED ORDER — SODIUM CHLORIDE 0.9 % IV SOLN
Freq: Once | INTRAVENOUS | Status: AC
  Administered 2016-08-05: 22:00:00 via INTRAVENOUS

## 2016-08-05 NOTE — Op Note (Signed)
[facility] Patient Name: Diane AlexandersJeane Tourigny Procedure Date : 08/14/2016 MRN: [id number] Attending MD: Graylin ShiverSalem F Luismario Coston , MD Date of Birth: 01-23-1956 CSN: [id number] Age: [age] Admit Type: Inpatient Procedure:                Upper GI endoscopy Indications:              Recent gastrointestinal bleeding Providers:                Graylin ShiverSalem F. Bram Hottel, MD, Sarah Monday, RN, Oletha Blendavida                            Shoffner, Technician Referring MD:              Medicines:                Fentanyl 25 micrograms IV, Midazolam 3 mg IV Complications:            No immediate complications. Estimated Blood Loss:     Estimated blood loss: none. Procedure:                Pre-Anesthesia Assessment:                           - Prior to the procedure, a History and Physical                            was performed, and patient medications and                            allergies were reviewed. The patient's tolerance of                            previous anesthesia was also reviewed. The risks                            and benefits of the procedure and the sedation                            options and risks were discussed with the patient.                            All questions were answered, and informed consent                            was obtained. Prior Anticoagulants: The patient has                            taken no previous anticoagulant or antiplatelet                            agents. ASA Grade Assessment: III - A patient with                            severe systemic disease. After reviewing the risks  and benefits, the patient was deemed in                            satisfactory condition to undergo the procedure.                           After obtaining informed consent, the endoscope was                            passed under direct vision. Throughout the                            procedure, the patient's blood pressure, pulse, and     oxygen saturations were monitored continuously. The                            [id number] ([id number]) scope was introduced through the                            mouth, and advanced to the second part of duodenum.                            The upper GI endoscopy was accomplished without                            difficulty. The patient tolerated the procedure                            fairly well. Scope In: Scope Out: Findings:      The examined esophagus was normal. No varices.      Mildly erythematous mucosa was found in the stomach.      The examined duodenum was normal.      No signs of bleeding. Mucosa somewhat friable. Impression:               - Normal esophagus.                           - Erythematous mucosa in the stomach.                           - Normal examined duodenum.                           - No specimens collected. Moderate Sedation:      . Recommendation:           - Resume regular diet.                           - Continue present medications. Procedure Code(s):        --- Professional ---                           [phone], Esophagogastroduodenoscopy, flexible,                            transoral;  diagnostic, including collection of                            specimen(s) by brushing or washing, when performed                            (separate procedure) Diagnosis Code(s):        --- Professional ---                           K31.89, Other diseases of stomach and duodenum                           K92.2, Gastrointestinal hemorrhage, unspecified CPT copyright 2016 [employer]. All rights reserved. The codes documented in this report are preliminary and upon coder review may  be revised to meet current compliance requirements. Graylin Shiver, MD 08/15/2016 11:13:04 AM This report has been signed electronically. Number of Addenda: 0

## 2016-08-05 NOTE — Progress Notes (Signed)
PULMONARY / CRITICAL CARE MEDICINE   Name: Diane Cook MRN: [id number] DOB: 10/26/1956    ADMISSION DATE:  08/27/2016 CONSULTATION DATE:  08/27/2016  REFERRING MD:  Dalene SeltzerSchlossman - EDP  CHIEF COMPLAINT:  Abd pain, dark stools  HISTORY OF PRESENT ILLNESS:   Diane Cook is a 60 y.o. female with PMH  including known ETOH cirrhosis, chronic hep C.  She had recent admission 06/06/16 through 06/12/16 for GI bleed.  Had EGD 6/14 which revealed duodenitis, no varices or ulcers.  She was discharged and instructed to have outpatient follow up with GI.  On 8/6, [facility] with melena and hematemesis x 1 along with RLQ abd pain. Hgb was 5.6, she was started on PRBC transfusion.  In addition, she had AKI with hyperkalemia   SUBJECTIVE:  More awake and alert Dark stool per RN Afebrile Required 2 more units of blood last 24 hours  VITAL SIGNS: BP 137/72   Pulse 97   Temp 98.2 F (36.8 C) (Axillary)   Resp 12   Ht 5\' 11"  (1.803 m)   Wt (!) 363 lb (164.7 kg)   SpO2 100%   BMI 50.63 kg/m   HEMODYNAMICS: CVP:  [11 mmHg-20 mmHg] 11 mmHg  VENTILATOR SETTINGS:    INTAKE / OUTPUT: I/O last 3 completed shifts: In: 10314.9 [I.V.:6847.9; Blood:3307; Other:10; NG/GT:100; IV Piggyback:50] Out: 1950 [Urine:1900; Emesis/NG output:50]   PHYSICAL EXAMINATION: General: Obese female, in NAD. Neuro: Awake , A&O x 3, non-focal HEENT: [place]/AT. PERRL, sclerae anicteric.   Cardiovascular: RRR, 3/6 SEM.  Lungs: Respirations even and unlabored.  CTA bilaterally, No W/R/R. Abdomen: Obese, BS x 4, soft, tender RLQ. Musculoskeletal: No gross deformities, 1+ non pitting edema.  Skin: Intact, warm, no rashes.  LABS:  BMET  Recent Labs Lab 08/03/16 1902 08/04/16 0545 08/04/2016 0220  NA 134* 132* 135  K 5.5* 4.9 4.1  CL 106 102 100*  CO2 21* 23 26  BUN 63* 65* 64*  CREATININE 3.76* 3.76* 3.19*  GLUCOSE 97 130* 120*    Electrolytes  Recent Labs Lab 08/03/16 1902 08/04/16 0545  08/13/2016 0220  CALCIUM 9.3 9.0 8.9  MG  --   --  1.9  PHOS  --   --  5.1*    CBC  Recent Labs Lab 08/04/16 1520 08/04/16 1900 08/11/2016 0220  WBC 10.2 9.1 8.9  HGB 7.1* 6.7* 7.1*  HCT 20.9* 19.2* 20.9*  PLT 85* 76* 65*    Coag's  Recent Labs Lab 08/03/16 1902 08/04/16 0545 08/04/16 1900  INR 2.45 2.74 1.87    Sepsis Markers  Recent Labs Lab Nov 23, 2016 2305 08/03/16 0633  LATICACIDVEN 6.81* 3.7*    ABG No results for input(s): PHART, PCO2ART, PO2ART in the last 168 hours.  Liver Enzymes  Recent Labs Lab Nov 23, 2016 2253 08/01/2016 0220  AST 250* 147*  ALT 82* 58*  ALKPHOS 113 75  BILITOT 13.9* 16.0*  ALBUMIN 1.4* 2.0*    Cardiac Enzymes  Recent Labs Lab 08/03/16 1758 08/03/16 2300 08/04/16 0545  TROPONINI <0.03 0.05* 0.06*    Glucose  Recent Labs Lab Nov 23, 2016 2347 08/03/16 0623 08/03/16 1603 08/04/16 1946 07/31/2016 0000 08/27/2016 0403  GLUCAP 114* 88 79 131* 118* 120*    Imaging No results found.   STUDIES:  CXR 8/7 > mild edema.  CULTURES: None.  ANTIBIOTICS: ceftx 8/6 >> (proph)  SIGNIFICANT EVENTS: 8/7 > admit for GI bleed, presumed upper.  LINES/TUBES: LIJ 8/7 >>.  DISCUSSION: 60 y.o. F with hx  ETOH cirrhosis and hep c, had recent UGIB in June 2017 due to duodenitis.  Admitted again 8/7 with UGI bleed and AKI (on Lasix and Aldactone)  ASSESSMENT / PLAN:  GASTROINTESTINAL A:   UGIB - presumed upper.  Had admission June 2017 where EGD revealed duodenitis. Rpt EGD neg 8/9 Hx ETOH cirrhosis, HCV.  P:   Continue PPI q 12 GI Following-  Resume diet  HEMATOLOGIC A:   AoC anemia - exacerbated due to acute UGIB. S/p 6 U PRBC Chronic thrombocytopenia - likely due to bone marrow suppression from ETOH. Coagulopathy - likely due to liver dysfunction, s/p 8 U FFP VTE Prophylaxis. P:  Continue PRBC transfusions to maintain Hgb > 7. Give more vitamin K and 2 units FFP & rechk INR SCD's.   RENAL A:    Hyponatremia. Hyperkalemia - improved with calcium, bicarbonate and Kayexalate AKI - presumed due to decreased PO intake + UGIB + Lasix/Aldactone - improving NAGMA. P:   Ct HCO3 @ 50- can discontinue in 24 hours   CARDIOVASCULAR A:  Hemorrhagic shock - Likely due to GI bleed + decreased PO intake.  P:  Continue PRBC transfusion. Should be able to taper levophed + vaso gtt Follow CVP   PULMONARY A: Hx asthma. Probable OSA / OHS. P:   CPAP q hs  INFECTIOUS A:   No indication of infection. P:   Ceftx for SBP prophylaxis  ENDOCRINE A:   No acute issues.   P:   No interventions required.  NEUROLOGIC A:  Hepatic encephalopathy -resolved Hx ETOH abuse. P:   Thiamine / folate. ETOH cessation counseling. Lactulose - titrate to 3 soft BMs/d, decreased to 20 twice a day  Family updated: Husband and daughter updated at bedside.  Interdisciplinary Family Meeting v Palliative Care Meeting:  Due by: 08/14/2016.  Summary- reassuring that repeat EGD was negative, bleeding should've stopped, expect renal function to improve now, her encephalopathy has resolved with lactulose  CC time: 31 minutes.   Cyril Mourning MD. Tonny Bollman. [place] [facility] Pager [phone] If no response call [phone]    2016/08/28, 12:07 PM

## 2016-08-05 NOTE — Progress Notes (Signed)
eLink Physician-Brief Progress Note Patient Name: Diane AlexandersJeane Weight DOB: [birth year] MRN: [id number]   Date of Service  08/20/2016  HPI/Events of Note  GI bleed - Hgb = 6.7.   eICU Interventions  Will transfuse 1 unit PRBC now.      Intervention Category Major Interventions: Hemorrhage - evaluation and management  Ricarda Atayde Dennard Nipugene 08/17/2016, 6:35 PM

## 2016-08-06 ENCOUNTER — Encounter (HOSPITAL_COMMUNITY): Payer: Self-pay | Admitting: Gastroenterology

## 2016-08-06 ENCOUNTER — Inpatient Hospital Stay (HOSPITAL_COMMUNITY): Payer: Medicaid Other

## 2016-08-06 LAB — CBC
HCT: 19.1 % — ABNORMAL LOW (ref 36.0–46.0)
HEMATOCRIT: 20 % — AB (ref 36.0–46.0)
HEMATOCRIT: 19.7 % — AB (ref 36.0–46.0)
HEMOGLOBIN: 6.7 g/dL — AB (ref 12.0–15.0)
Hemoglobin: 6.7 g/dL — CL (ref 12.0–15.0)
Hemoglobin: 6.7 g/dL — CL (ref 12.0–15.0)
MCH: 32.7 pg (ref 26.0–34.0)
MCH: 32.7 pg (ref 26.0–34.0)
MCH: 33.3 pg (ref 26.0–34.0)
MCHC: 33.5 g/dL (ref 30.0–36.0)
MCHC: 34 g/dL (ref 30.0–36.0)
MCHC: 35.1 g/dL (ref 30.0–36.0)
MCV: 97.6 fL (ref 78.0–100.0)
MCV: 96.1 fL (ref 78.0–100.0)
MCV: 95 fL (ref 78.0–100.0)
PLATELETS: 56 10*3/uL — AB (ref 150–400)
PLATELETS: 64 10*3/uL — AB (ref 150–400)
Platelets: 58 10*3/uL — ABNORMAL LOW (ref 150–400)
RBC: 2.05 MIL/uL — ABNORMAL LOW (ref 3.87–5.11)
RBC: 2.05 MIL/uL — ABNORMAL LOW (ref 3.87–5.11)
RBC: 2.01 MIL/uL — ABNORMAL LOW (ref 3.87–5.11)
RDW: 21.2 % — AB (ref 11.5–15.5)
RDW: 20.1 % — AB (ref 11.5–15.5)
RDW: 20.1 % — AB (ref 11.5–15.5)
WBC: 11.4 10*3/uL — ABNORMAL HIGH (ref 4.0–10.5)
WBC: 11.8 10*3/uL — AB (ref 4.0–10.5)
WBC: 12.8 10*3/uL — AB (ref 4.0–10.5)

## 2016-08-06 LAB — TYPE AND SCREEN
ABO/RH(D): O POS
ANTIBODY SCREEN: NEGATIVE
UNIT DIVISION: 0
UNIT DIVISION: 0
UNIT DIVISION: 0
Unit division: 0
Unit division: 0

## 2016-08-06 LAB — LACTIC ACID, PLASMA: Lactic Acid, Venous: 3.2 mmol/L (ref 0.5–1.9)

## 2016-08-06 LAB — PREPARE RBC (CROSSMATCH)

## 2016-08-06 LAB — PREPARE FRESH FROZEN PLASMA
UNIT DIVISION: 0
UNIT DIVISION: 0
UNIT DIVISION: 0
UNIT DIVISION: 0

## 2016-08-06 LAB — BASIC METABOLIC PANEL
Anion gap: 8 (ref 5–15)
Anion gap: 8 (ref 5–15)
BUN: 74 mg/dL — AB (ref 6–20)
BUN: 80 mg/dL — AB (ref 6–20)
CALCIUM: 8.7 mg/dL — AB (ref 8.9–10.3)
CALCIUM: 8.8 mg/dL — AB (ref 8.9–10.3)
CO2: 29 mmol/L (ref 22–32)
CO2: 28 mmol/L (ref 22–32)
CREATININE: 3.23 mg/dL — AB (ref 0.44–1.00)
Chloride: 101 mmol/L (ref 101–111)
Chloride: 102 mmol/L (ref 101–111)
Creatinine, Ser: 2.96 mg/dL — ABNORMAL HIGH (ref 0.44–1.00)
GFR calc Af Amer: 18 mL/min — ABNORMAL LOW (ref >60)
GFR calc non Af Amer: 14 mL/min — ABNORMAL LOW (ref >60)
GFR, EST AFRICAN AMERICAN: 17 mL/min — AB (ref >60)
GFR, EST NON AFRICAN AMERICAN: 16 mL/min — AB (ref >60)
GLUCOSE: 106 mg/dL — AB (ref 65–99)
Glucose, Bld: 100 mg/dL — ABNORMAL HIGH (ref 65–99)
Potassium: 4.3 mmol/L (ref 3.5–5.1)
Potassium: 4.5 mmol/L (ref 3.5–5.1)
SODIUM: 138 mmol/L (ref 135–145)
Sodium: 138 mmol/L (ref 135–145)

## 2016-08-06 LAB — PREPARE PLATELET PHERESIS
Unit division: 0
Unit division: 0

## 2016-08-06 MED ORDER — TECHNETIUM TC 99M-LABELED RED BLOOD CELLS IV KIT
25.0000 | PACK | Freq: Once | INTRAVENOUS | Status: AC | PRN
  Administered 2016-08-06: 25 via INTRAVENOUS

## 2016-08-06 MED ORDER — SODIUM CHLORIDE 0.9 % IV SOLN: Freq: Once | INTRAVENOUS | Status: DC

## 2016-08-06 MED ORDER — PANTOPRAZOLE SODIUM 40 MG IV SOLR
40.0000 mg | INTRAVENOUS | Status: DC
  Administered 2016-08-07 – 2016-08-09 (×3): 40 mg via INTRAVENOUS
  Filled 2016-08-06 (×3): qty 40

## 2016-08-06 MED ORDER — SODIUM CHLORIDE 0.9 % IV SOLN
Freq: Once | INTRAVENOUS | Status: AC
  Administered 2016-08-06: 22:00:00 via INTRAVENOUS

## 2016-08-06 MED ORDER — SODIUM CHLORIDE 0.9 % IV BOLUS (SEPSIS)
500.0000 mL | Freq: Once | INTRAVENOUS | Status: AC
Start: 2016-08-06 — End: 2016-08-06
  Administered 2016-08-06: 500 mL via INTRAVENOUS

## 2016-08-06 NOTE — Progress Notes (Signed)
[facility] 6.7 [facility]

## 2016-08-06 NOTE — Progress Notes (Signed)
eLink Physician-Brief Progress Note Patient Name: Diane AlexandersJeane Cook DOB: 30-Apr-1956 MRN: [id number]   Date of Service  08/06/2016  HPI/Events of Note  Hb=6.7  eICU Interventions  Transfuse 1u PRBC, slowly     Intervention Category Intermediate Interventions: Coagulopathy - evaluation and management  Journe Hallmark 08/06/2016, 5:40 AM

## 2016-08-06 NOTE — Progress Notes (Signed)
Chief Complaint: Patient was seen in consultation today for GI bleed at the request of Dr. Herbert MoorsSalem Ganem  Referring Physician(s): Dr. Herbert MoorsSalem Ganem Dr. Kalman ShanMurali Ramaswamy  Supervising Physician: Richarda OverlieHenn, Adam  Patient Status: Inpatient  History of Present Illness: Diane Cook is a 60 y.o. female with multiple medical problems including EtOH cirrhosis and HCV. She was admitted with hemorrhagic shock secondary to GI bleed. EGD showed some mild gastritis but no varices. She's had no hematemesis but rather melena. She has had to be placed on pressor support due to hypotension. She underwent a NM RBC scan today which was a diminished quality exam but suggestive of active bleed. IR is now consulted to evaluate for angiogram and possible embolization. Pt has significant liver dysfunction as well as renal insufficiency at this time. She required FFP to get her INR down from 4+ to 1.8 She is encephalopathic and very lethargic in her room. No family at bedside, but able to discuss with her family via phone. Chart, PMHx, meds, labs, imaging reviewed.  Past Medical History:  Diagnosis Date  . Alcoholism (HCC)   . Arthritis Dx 2014  . Asthma Dx 2001  . Bipolar disorder (HCC) [id number]  . Family history of adverse reaction to anesthesia    sister & daughter have a hard time waking up   . H/O non anemic vitamin B12 deficiency   . Hepatitis C Dx 2011  . Hypertension Dx 2004  . Stroke [facility](HCC) Dx 1997    Past Surgical History:  Procedure Laterality Date  . ABDOMINAL HYSTERECTOMY    . CHOLECYSTECTOMY    . ESOPHAGOGASTRODUODENOSCOPY N/A 08/18/2016   Procedure: ESOPHAGOGASTRODUODENOSCOPY (EGD);  Surgeon: Graylin ShiverSalem F Ganem, MD;  [facility];  Service: Endoscopy;  Laterality: N/A;  . ESOPHAGOGASTRODUODENOSCOPY (EGD) WITH PROPOFOL N/A 06/10/2016   Procedure: ESOPHAGOGASTRODUODENOSCOPY (EGD) WITH PROPOFOL;  Surgeon: Carman ChingJames Edwards, MD;  Location: [facility] ENDOSCOPY;  Service: Endoscopy;  Laterality: N/A;     Allergies: Review of patient's allergies indicates no known allergies.  Medications:  Current Facility-Administered Medications:  .  0.9 %  sodium chloride infusion, , Intravenous, Once, Vishal Mungal, MD .  albuterol (PROVENTIL) (2.5 MG/3ML) 0.083% nebulizer solution 2.5 mg, 2.5 mg, Nebulization, Q4H PRN, Leslye Peerobert S Byrum, MD .  antiseptic oral rinse (CPC / CETYLPYRIDINIUM CHLORIDE 0.05%) solution 7 mL, 7 mL, Mouth Rinse, BID, Oretha Milchakesh V Alva, MD, 7 mL at 08/06/16 1000 .  cefTRIAXone (ROCEPHIN) 1 g in dextrose 5 % 50 mL IVPB, 1 g, Intravenous, Q24H, Oretha Milchakesh V Alva, MD, 1 g at 08/06/16 0000 .  folic acid injection 1 mg, 1 mg, Intravenous, Daily, Rahul P Desai, PA-C, 1 mg at 08/06/16 1006 .  lactulose (CHRONULAC) 10 GM/15ML solution 20 g, 20 g, Oral, BID, Oretha Milchakesh V Alva, MD, 20 g at 08/06/16 1107 .  norepinephrine (LEVOPHED) 16 mg in dextrose 5 % 250 mL (0.064 mg/mL) infusion, 2-50 mcg/min, Intravenous, Continuous, Leslye Peerobert S Byrum, MD, Last Rate: 11.3 mL/hr at 08/06/16 1540, 12 mcg/min at 08/06/16 1540 .  ondansetron (ZOFRAN) injection 4 mg, 4 mg, Intravenous, Q6H PRN, Leslye Peerobert S Byrum, MD, 4 mg at 08/22/2016 0218 .  [START ON 08/07/2016] pantoprazole (PROTONIX) injection 40 mg, 40 mg, Intravenous, Q24H, Kalman ShanMurali Ramaswamy, MD .  sodium bicarbonate 150 mEq in dextrose 5 % 1,000 mL infusion, , Intravenous, Continuous, Kalman ShanMurali Ramaswamy, MD, Last Rate: 25 mL/hr at 08/06/16 1040 .  thiamine (B-1) injection 100 mg, 100 mg, Intravenous, Daily, Rahul P Desai, PA-C, 100 mg at 08/06/16 1006  Family History  Problem Relation Age of Onset  . Diabetes Mother   . Diabetes Daughter     Social History   Social History  . Marital status: Married    Spouse name: N/A  . Number of children: N/A  . Years of education: N/A   Social History Main Topics  . Smoking status: Former Smoker    Packs/day: 0.25    Years: 40.00    Types: Cigarettes    Quit date: 05/09/2015  . Smokeless tobacco: Never Used  .  Alcohol use No     Comment: Patient drinks 6 40oz per day, 1 pint of liquor daily; Last drink 05/09/15  . Drug use: No  . Sexual activity: Not Asked   Other Topics Concern  . None   Social History Narrative  . None    ECOG Status: 4 - Bedbound  Review of Systems: A 12 point ROS discussed and pertinent positives are indicated in the HPI above.  All other systems are negative.  Review of Systems  Vital Signs: BP 110/61   Pulse (!) 101   Temp 97.8 F (36.6 C) (Oral)   Resp 18   Ht  (1.803 m)   Wt (!) 370 lb (167.8 kg)   SpO2 99%   BMI 51.60 kg/m   Physical Exam  Constitutional: She appears well-developed.  HENT:  Mouth/Throat: Oropharynx is clear and moist.  Neck: No JVD present. No tracheal deviation present.  Cardiovascular: Exam reveals no friction rub.   No murmur heard. Tachycardic, but regular  Pulmonary/Chest: Effort normal and breath sounds normal. No respiratory distress. She has no wheezes.  Abdominal: Soft. She exhibits no mass. There is no guarding.  Neurological:  Opens eyes when we called her voice, but immediately back to sleep  Skin: Skin is warm and dry.    Mallampati Score:  MD Evaluation Airway: WNL Heart: WNL Abdomen: WNL Chest/ Lungs: WNL ASA  Classification: 3 Mallampati/Airway Score: Three  Imaging: Nm Gi Blood Loss  Result Date: 08/06/2016 CLINICAL DATA:  Evaluate for GI bleed. EXAM: NUCLEAR MEDICINE GASTROINTESTINAL BLEEDING SCAN TECHNIQUE: Sequential abdominal images were obtained following intravenous administration of Tc-110m labeled red blood cells. RADIOPHARMACEUTICALS:  25.8 mCi Tc-30m in-vitro labeled red cells. COMPARISON:  None. FINDINGS: Markedly diminished exam detail secondary to excessive motion artifact. Patient was not able to remain still during the examination. Within this limitation there is evidence to suggest intraluminal radiotracer accumulation within the bowel loops. Compatible with the clinical history of GI  bleed. On the first hour the radiotracer appears to accumulate in moved in the distribution of the left lower quadrant of the abdomen. On the second hour radiotracer can be seen crossing the midline from left to right. IMPRESSION: 1. Markedly diminished exam detail secondary to excessive motion artifact. 2. Based on the images acquired there is evidence of intraluminal bowel accumulation which may reflect a small or large bowel bleed. I cannot confidently say whether the activity is originating from small bowel or large bowel loops or within the right side of abdomen or left abdomen. Electronically Signed   By: Signa Kell M.D.   On: 08/06/2016 15:01    Labs:  CBC:  Recent Labs  08-25-2016 0220 August 25, 2016 1705 08/06/16 0435 08/06/16 1015  WBC 8.9 10.6* 11.4* 11.8*  HGB 7.1* 6.7* 6.7* 6.7*  HCT 20.9* 19.9* 20.0* 19.7*  PLT 65* 65* 58* 56*    COAGS:  Recent Labs  08/01/2016 2253 08/03/16 1902 08/04/16 0545 08/04/16 1900  INR 4.64* 2.45 2.74 1.87    BMP:  Recent Labs  08/04/16 0545 08/12/2016 0220 08/06/16 0435 08/06/16 1545  NA 132* 135 138 138  K 4.9 4.1 4.3 4.5  CL 102 100* 101 102  CO2 23 26 29 28   GLUCOSE 130* 120* 106* 100*  BUN 65* 64* 74* 80*  CALCIUM 9.0 8.9 8.7* 8.8*  CREATININE 3.76* 3.19* 2.96* 3.23*  GFRNONAA 12* 15* 16* 14*  GFRAA 14* 17* 18* 17*    LIVER FUNCTION TESTS:  Recent Labs  06/09/16 0511 06/10/16 0514 08/19/2016 2253 Aug 08, 2016 0220  BILITOT 7.8* 9.4* 13.9* 16.0*  AST 53* 49* 250* 147*  ALT 23 23 82* 58*  ALKPHOS 66 61 113 75  PROT 7.3 7.6 6.7 6.8  ALBUMIN 3.0* 3.6 1.4* 2.0*    Assessment and Plan: GI bleed, uncertain source, though suspect lower GI/colon Extensive discussion with family. She has renal failure which could certainly worsen with angiogram contrast and result in end stage renal disease requiring dialysis, which the family is unsure they are prepared to deal with She is still coagulopathic as well and she is still at risk  for persistent bleeding as well as bleeding complications from the procedure/groin stick. After discussion with family, will hold on proceeding with angiogram at this time. They wish to continue conservative therapy and hope that bleeding is self limiting. They understand the risks involved with arteriogram as discussed with Dr. Lowella Dandy, but know that procedure can still be done if recurrent bleed or she becomes unstable. Continue correction of coagulopathy as best possible.   Thank you for this interesting consult.  A copy of this report was sent to the requesting provider on this date.  Electronically Signed: Brayton El 08/06/2016, 4:02 PM   I spent a total of 20 in face to face in clinical consultation, greater than 50% of which was counseling/coordinating care for GI bleed

## 2016-08-06 NOTE — Progress Notes (Signed)
PULMONARY / CRITICAL CARE MEDICINE   Name: Diane Cook MRN: [id number] DOB: 1956-11-29    ADMISSION DATE:  2016/08/29 CONSULTATION DATE:  Aug 29, 2016  REFERRING MD:  Dalene Seltzer - EDP  CHIEF COMPLAINT:  Abd pain, dark stools  BRIEF Diane Cook is a 60 y.o. female with PMH  including known ETOH cirrhosis, chronic hep C.  She had recent admission 06/06/16 through 06/12/16 for GI bleed.  Had EGD 6/14 which revealed duodenitis, no varices or ulcers.  She was discharged and instructed to have outpatient follow up with GI.  On 8/6, she presented again to [facility] ED with melena and hematemesis x 1 along with RLQ abd pain.  Symptoms had started 2 - 3 days earlier and persisted.  In ED, she had 1 episode of melena but no hematemesis or hematochezia.  Hgb was 5.6, she was started on PRBC transfusion.  In addition, she had AKI with hyperkalemia   STUDIES:  CXR 8/7 > mild edema.  CULTURES: None.  ANTIBIOTICS: None.   LINES/TUBES: LIJ 8/7 >>.   SIGNIFICANT EVENTS: 8/7 > admit for GI bleed, presumed upper. 8/8?17 - FFP  + 2 units prbc 08/06/2016:   No chest pain. Or dyspnea Dark stool . Afebrile. 1 unit prbc for 6.7hgb 8./9./17 endoscopy  -  The examined esophagus was normal. No varices.      Mildly erythematous mucosa was found in the stomach.      The examined duodenum was normal.      No signs of bleeding. Mucosa somewhat friable. Impression:               - Normal esophagus.                           - Erythematous mucosa in the stomach.                           - Normal examined duodenum.                           - No specimens collected.   SUBJECTIVE/OVERNIGHT/INTERVAL HX 08/06/16 - hgb 6.7 hgb   1 unit prbc. RN reports - frank brbpr since early this AM. On levophed snice 08/03/16 and on overnight but this am down to   But bp soft. Urirne output not great per rN but creat improving. Off protonix gtt - now on protonix bid. Daughter at bedside  VITAL SIGNS: BP 100/63   Pulse (!) 101    Temp 97.7 F (36.5 C) (Oral)   Resp 18   Ht  (1.803 m)   Wt (!) 167.8 kg (370 lb)   SpO2 100%   BMI 51.60 kg/m   HEMODYNAMICS: CVP:  [10 mmHg] 10 mmHg  VENTILATOR SETTINGS:    INTAKE / OUTPUT: I/O last 3 completed shifts: In: 6975.3 [I.V.:3968.3; Blood:2817; Other:40; IV Piggyback:150] Out: 2145 [Urine:2145]   PHYSICAL EXAMINATION: General: Obese female, in NAD. Morbidly obese Neuro:A&O x32 to place & name, non-focal. Moves all 4s HEENT: [place]/AT. PERRL, sclerae anicteric.   Cardiovascular: RRR, 3/6 SEM.  Lungs: Respirations even and unlabored.  CTA bilaterally, No W/R/R. Abdomen: Obese, BS x 4, soft, tender RLQ. Musculoskeletal: No gross deformities, 1+ non pitting edema.  Skin: Intact, warm, no rashes.  LABS:  PULMONARY  Recent Labs Lab 08-29-2016 2305 08/03/16 0240  HCO3  --  14.3*  TCO2 18  13.7  O2SAT  --  97.4    CBC  Recent Labs Lab 08/27/2016 0220 08/06/2016 1705 08/06/16 0435  HGB 7.1* 6.7* 6.7*  HCT 20.9* 19.9* 20.0*  WBC 8.9 10.6* 11.4*  PLT 65* 65* 58*    COAGULATION  Recent Labs Lab 2016-03-24 2253 08/03/16 1902 08/04/16 0545 08/04/16 1900  INR 4.64* 2.45 2.74 1.87    CARDIAC   Recent Labs Lab 08/03/16 0231 08/03/16 1758 08/03/16 2300 08/04/16 0545  TROPONINI 0.07* <0.03 0.05* 0.06*   No results for input(s): PROBNP in the last 168 hours.   CHEMISTRY  Recent Labs Lab 08/03/16 1030 08/03/16 1902 08/04/16 0545 08/16/2016 0220 08/06/16 0435  NA 133* 134* 132* 135 138  K 6.4* 5.5* 4.9 4.1 4.3  CL 107 106 102 100* 101  CO2 17* 21* 23 26 29   GLUCOSE 105* 97 130* 120* 106*  BUN 59* 63* 65* 64* 74*  CREATININE 3.48* 3.76* 3.76* 3.19* 2.96*  CALCIUM 9.1 9.3 9.0 8.9 8.7*  MG  --   --   --  1.9  --   PHOS  --   --   --  5.1*  --    Estimated Creatinine Clearance: 34.1 mL/min (by C-G formula based on SCr of 2.96 mg/dL).   LIVER  Recent Labs Lab 2016-03-24 2253 08/03/16 1902 08/04/16 0545 08/04/16 1900  08/24/2016 0220  AST 250*  --   --   --  147*  ALT 82*  --   --   --  58*  ALKPHOS 113  --   --   --  75  BILITOT 13.9*  --   --   --  16.0*  PROT 6.7  --   --   --  6.8  ALBUMIN 1.4*  --   --   --  2.0*  INR 4.64* 2.45 2.74 1.87  --      INFECTIOUS  Recent Labs Lab 2016-03-24 2305 08/03/16 0633  LATICACIDVEN 6.81* 3.7*     ENDOCRINE CBG (last 3)   Recent Labs  08/04/16 1946 08/06/2016 0000 07/28/2016 0403  GLUCAP 131* 118* 120*         IMAGING x48h  - image(s) personally visualized  -   highlighted in bold Dg Abd Portable 1v  Result Date: 08/04/2016 CLINICAL DATA:  60 year old female with a history of nasogastric tube placement. EXAM: PORTABLE ABDOMEN - 1 VIEW COMPARISON:  Chest x-ray 08/04/2016 FINDINGS: Limited plain film of the abdomen with exclusion of the majority of the right abdomen. Gastric tube terminates just to the right of the midline with the side port projecting over the spine. Tip appears to terminate in the region of the pylorus. Gas within small bowel and colon. IMPRESSION: Limited plain film demonstrates gastric tube terminating in the region of the pylorus. Signed, Yvone NeuJaime S. Loreta AveWagner, DO [facility] Electronically Signed   By: Gilmer MorJaime  Wagner D.O.   On: 08/04/2016 11:56    60 y.o. F with hx ETOH cirrhosis and hep c, had recent UGIB in June 2017 due to duodenitis.  Admitted again 8/7 with GI bleed, presumed upper again.  ASSESSMENT / PLAN:  GASTROINTESTINAL A:   Hx ETOH cirrhosis, HCV.   UGIB - presumed upper.  Had admission June 2017 where EGD revealed duodenitis. Repeat endoscopy 08/04/16 - faily normal UGI scoe  08/06/16 - active lower gi bleed  Homero FellersFrank blood  P:   Recall GI -  tagged red cell sca statt ordered; d/w Dr  Ganem Restart NPO  HEMATOLOGIC A:   AoC anemia - exacerbated due to acute UGIB. S/p multiple prlbc Chronic thrombocytopenia - likely due to bone marrow suppression from  ETOH. Coagulopathy - likely due to liver dysfunction. VTE Prophylaxis. P:  Continue PRBC transfusions to maintain Hgb > 7  SCD's.   RENAL A AoCKD - presumed due to decreased PO intake + UGIB + N/V.   - improved 08/06/2016 but ur op sluggish,.. Bic improved to 26  P:   Fluid bolus 500cc reeduce bicarb to 25cc/h   CARDIOVASCULAR A:  Hemorrhagic shock    - improved vasiopressor need since admit 08/25/2016 butworsening again 08/06/2016 in setting of LGI bleed  P:  Ct levophed  - MAP gial> 65  PULMONARY A: Hx asthma. Probable OSA / OHS. P:   Pulmonary hygiene. CPAP Qhs  INFECTIOUS A:   No indication of infection. P:   Monitor clinically.  ENDOCRINE A:   No acute issues.   P:   No interventions required.  NEUROLOGIC A:  Hepatic encephalopathy Hx ETOH abuse.   - normal mental status 08/06/16  P:   Thiamine / folate. ETOH cessation counseling. Lactulose - titrate to 3 soft BMs/d  Family updated: Husband and daughter updated at bedside ? 08/22/2016  Interdisciplinary Family Meeting v Palliative Care Meeting:  Due by: 31-Aug-2016. - done with RN 08/06/2016 - full code   GLOBAL 0-get tagged prbc scan 08/06/2016     The patient is critically ill with multiple organ systems failure and requires high complexity decision making for assessment and support, frequent evaluation and titration of therapies, application of advanced monitoring technologies and extensive interpretation of multiple databases.   Critical Care Time devoted to patient care services described in this note is  35  Minutes. This time reflects time of care of this signee Dr Kalman Shan. This critical care time does not reflect procedure time, or teaching time or supervisory time of PA/NP/Med student/Med Resident etc but could involve care discussion time    Dr. Kalman Shan, M.D., [employer]C.P Pulmonary and Critical Care Medicine Staff Physician [facility] [place] [facility]  [facility] Pager: [phone], If no answer or between  15:00h - 7:00h: call [phone]  08/06/2016 10:41 AM

## 2016-08-06 NOTE — Progress Notes (Signed)
CRITICAL VALUE ALERT  Critical value received:  Lactic acid 3.2  Date of notification:  08/06/16  Time of notification:  1113  Critical value read back: yes   Nurse who received alert:  Irven CoeLauren Lanae Federer RN  MD notified (1st page):  MD at bedside and notified  Time of first page:  MD at bedside and notified  MD notified (2nd page):  Time of second page:  Responding MD:  Marchelle Gearingamaswamy   Time MD responded:  At bedside

## 2016-08-06 NOTE — Progress Notes (Signed)
D/w Dr Evette CristalGanem  Patient still in nuc med. Bleeding positive LLQ colon  Plan Order for IR angip embolization done - Dr Evette CristalGanem d/w Dr Durwin NoraHennn  Dr. Kalman ShanMurali Vontrell Pullman, M.D., [facility].C.P Pulmonary and Critical Care Medicine Staff Physician [facility] [place] [facility] Pager: [phone], If no answer or between  15:00h - 7:00h: call [phone]  08/06/2016 2:17 PM

## 2016-08-06 NOTE — Progress Notes (Signed)
Went to nuclear medicine to see patient. She is having her GI bleeding scan. Discussed with Dr Bradly ChrisStroud. There is activity in the LLQ from what he can see. Consult put in to IR for arteriogram with embolization.

## 2016-08-06 NOTE — Progress Notes (Signed)
eLink Physician-Brief Progress Note Patient Name: Diane AlexandersJeane Lezama DOB: 07-31-1956 MRN: [id number]   Date of Service  08/06/2016  HPI/Events of Note  Hgb = 6.7.  eICU Interventions  Transfuse 1 unit PRBC.     Intervention Category Major Interventions: Hemorrhage - evaluation and management  Alessandria Henken Eugene 08/06/2016, 5:30 PM

## 2016-08-07 DIAGNOSIS — K922 Gastrointestinal hemorrhage, unspecified: Secondary | ICD-10-CM

## 2016-08-07 LAB — TYPE AND SCREEN
ABO/RH(D): O POS
Antibody Screen: NEGATIVE
UNIT DIVISION: 0
UNIT DIVISION: 0
UNIT DIVISION: 0
UNIT DIVISION: 0
UNIT DIVISION: 0
Unit division: 0
Unit division: 0
Unit division: 0

## 2016-08-07 LAB — CBC
HCT: 21.9 % — ABNORMAL LOW (ref 36.0–46.0)
HCT: 19 % — ABNORMAL LOW (ref 36.0–46.0)
HEMATOCRIT: 18.7 % — AB (ref 36.0–46.0)
HEMATOCRIT: 21.1 % — AB (ref 36.0–46.0)
HEMOGLOBIN: 6.4 g/dL — AB (ref 12.0–15.0)
HEMOGLOBIN: 7.3 g/dL — AB (ref 12.0–15.0)
HEMOGLOBIN: 7.1 g/dL — AB (ref 12.0–15.0)
HEMOGLOBIN: 6.5 g/dL — AB (ref 12.0–15.0)
MCH: 32.8 pg (ref 26.0–34.0)
MCH: 31.2 pg (ref 26.0–34.0)
MCH: 31.4 pg (ref 26.0–34.0)
MCH: 32.7 pg (ref 26.0–34.0)
MCHC: 34.2 g/dL (ref 30.0–36.0)
MCHC: 33.3 g/dL (ref 30.0–36.0)
MCHC: 33.6 g/dL (ref 30.0–36.0)
MCHC: 34.2 g/dL (ref 30.0–36.0)
MCV: 95.9 fL (ref 78.0–100.0)
MCV: 93.6 fL (ref 78.0–100.0)
MCV: 93.4 fL (ref 78.0–100.0)
MCV: 95.5 fL (ref 78.0–100.0)
PLATELETS: 45 10*3/uL — AB (ref 150–400)
Platelets: 59 10*3/uL — ABNORMAL LOW (ref 150–400)
Platelets: 56 10*3/uL — ABNORMAL LOW (ref 150–400)
Platelets: 57 10*3/uL — ABNORMAL LOW (ref 150–400)
RBC: 1.95 MIL/uL — AB (ref 3.87–5.11)
RBC: 2.34 MIL/uL — AB (ref 3.87–5.11)
RBC: 2.26 MIL/uL — ABNORMAL LOW (ref 3.87–5.11)
RBC: 1.99 MIL/uL — AB (ref 3.87–5.11)
RDW: 18.4 % — ABNORMAL HIGH (ref 11.5–15.5)
RDW: 17.2 % — ABNORMAL HIGH (ref 11.5–15.5)
RDW: 17.6 % — AB (ref 11.5–15.5)
RDW: 18.4 % — AB (ref 11.5–15.5)
WBC: 12.3 10*3/uL — AB (ref 4.0–10.5)
WBC: 12.9 10*3/uL — AB (ref 4.0–10.5)
WBC: 13.1 10*3/uL — ABNORMAL HIGH (ref 4.0–10.5)
WBC: 11.4 10*3/uL — ABNORMAL HIGH (ref 4.0–10.5)

## 2016-08-07 LAB — PROTIME-INR
INR: 3.4
PROTHROMBIN TIME: 35.2 s — AB (ref 11.4–15.2)

## 2016-08-07 LAB — BASIC METABOLIC PANEL
Anion gap: 9 (ref 5–15)
Anion gap: 10 (ref 5–15)
BUN: 90 mg/dL — AB (ref 6–20)
BUN: 93 mg/dL — ABNORMAL HIGH (ref 6–20)
CALCIUM: 8.8 mg/dL — AB (ref 8.9–10.3)
CO2: 29 mmol/L (ref 22–32)
CO2: 26 mmol/L (ref 22–32)
Calcium: 8.8 mg/dL — ABNORMAL LOW (ref 8.9–10.3)
Chloride: 103 mmol/L (ref 101–111)
Chloride: 101 mmol/L (ref 101–111)
Creatinine, Ser: 3.6 mg/dL — ABNORMAL HIGH (ref 0.44–1.00)
Creatinine, Ser: 3.94 mg/dL — ABNORMAL HIGH (ref 0.44–1.00)
GFR calc Af Amer: 15 mL/min — ABNORMAL LOW (ref >60)
GFR calc Af Amer: 13 mL/min — ABNORMAL LOW (ref >60)
GFR calc non Af Amer: 11 mL/min — ABNORMAL LOW (ref >60)
GFR, EST NON AFRICAN AMERICAN: 13 mL/min — AB (ref >60)
GLUCOSE: 102 mg/dL — AB (ref 65–99)
Glucose, Bld: 103 mg/dL — ABNORMAL HIGH (ref 65–99)
POTASSIUM: 4.8 mmol/L (ref 3.5–5.1)
Potassium: 5 mmol/L (ref 3.5–5.1)
SODIUM: 137 mmol/L (ref 135–145)
Sodium: 141 mmol/L (ref 135–145)

## 2016-08-07 LAB — CORTISOL: Cortisol, Plasma: 13.9 ug/dL

## 2016-08-07 LAB — PHOSPHORUS: PHOSPHORUS: 6.1 mg/dL — AB (ref 2.5–4.6)

## 2016-08-07 LAB — PREPARE RBC (CROSSMATCH)

## 2016-08-07 LAB — HEPATIC FUNCTION PANEL
ALBUMIN: 1.5 g/dL — AB (ref 3.5–5.0)
ALK PHOS: 56 U/L (ref 38–126)
ALT: 42 U/L (ref 14–54)
ALT: 46 U/L (ref 14–54)
AST: 111 U/L — AB (ref 15–41)
AST: 119 U/L — ABNORMAL HIGH (ref 15–41)
Albumin: 1.5 g/dL — ABNORMAL LOW (ref 3.5–5.0)
Alkaline Phosphatase: 57 U/L (ref 38–126)
BILIRUBIN DIRECT: 7.3 mg/dL — AB (ref 0.1–0.5)
BILIRUBIN INDIRECT: 7.6 mg/dL — AB (ref 0.3–0.9)
BILIRUBIN TOTAL: 14.9 mg/dL — AB (ref 0.3–1.2)
Bilirubin, Direct: 7.5 mg/dL — ABNORMAL HIGH (ref 0.1–0.5)
Indirect Bilirubin: 7.8 mg/dL — ABNORMAL HIGH (ref 0.3–0.9)
TOTAL PROTEIN: 4.6 g/dL — AB (ref 6.5–8.1)
Total Bilirubin: 15.3 mg/dL — ABNORMAL HIGH (ref 0.3–1.2)
Total Protein: 4.6 g/dL — ABNORMAL LOW (ref 6.5–8.1)

## 2016-08-07 LAB — MAGNESIUM: Magnesium: 1.8 mg/dL (ref 1.7–2.4)

## 2016-08-07 LAB — LACTIC ACID, PLASMA: LACTIC ACID, VENOUS: 4.9 mmol/L — AB (ref 0.5–1.9)
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
## 2016-08-07 MED ORDER — WHITE PETROLATUM GEL
Status: AC
  Administered 2016-08-07: 0.2
  Filled 2016-08-07: qty 1

## 2016-08-07 MED ORDER — SODIUM CHLORIDE 0.9 % IV SOLN
Freq: Once | INTRAVENOUS | Status: AC
  Administered 2016-08-07: 15:00:00 via INTRAVENOUS

## 2016-08-07 MED ORDER — SODIUM CHLORIDE 0.9 % IV BOLUS (SEPSIS)
500.0000 mL | Freq: Once | INTRAVENOUS | Status: AC
  Administered 2016-08-07: 500 mL via INTRAVENOUS

## 2016-08-07 MED ORDER — SODIUM CHLORIDE 0.9 % IV SOLN
0.0300 [IU]/min | INTRAVENOUS | Status: DC
  Administered 2016-08-07 – 2016-08-09 (×2): 0.03 [IU]/min via INTRAVENOUS
  Filled 2016-08-07 (×3): qty 2

## 2016-08-07 MED ORDER — SODIUM CHLORIDE 0.9 % IV SOLN
Freq: Once | INTRAVENOUS | Status: AC
  Administered 2016-08-07: 21:00:00 via INTRAVENOUS

## 2016-08-07 MED ORDER — SODIUM CHLORIDE 0.9 % IV SOLN
Freq: Once | INTRAVENOUS | Status: AC
  Administered 2016-08-07: 03:00:00 via INTRAVENOUS

## 2016-08-07 MED ORDER — SODIUM CHLORIDE 0.9 % IV SOLN
Freq: Once | INTRAVENOUS | Status: AC
  Administered 2016-08-08: 01:00:00 via INTRAVENOUS

## 2016-08-07 MED ORDER — DEXTROSE 5 % IV SOLN
10.0000 mg | Freq: Once | INTRAVENOUS | Status: AC
  Administered 2016-08-07: 10 mg via INTRAVENOUS
  Filled 2016-08-07: qty 1

## 2016-08-07 NOTE — Care Management Note (Signed)
Case Management Note  Patient Details  Name: Diane Cook MRN: [id number] Date of Birth: [birth year]  Subjective/Objective:                    Action/Plan: Recently discharge to [facility] - since that time pt has been discharged home to husband.  CM will continue to follow for discharge needs   Expected Discharge Date:                  Expected Discharge Plan:  Home w Home Health Services  In-House Referral:     Discharge planning Services  CM Consult  Post Acute Care Choice:    Choice offered to:     DME Arranged:    DME Agency:     HH Arranged:    HH Agency:     Status of Service:  In process, will continue to follow  If discussed at Long Length of Stay Meetings, dates discussed:    Additional Comments: 08/07/2016 Levophed increased to 28 mcg, bleeding slowed per RN.  Black / tarry stool (was bright red yesterday).  O2 increased to 6L, tachypneic last night.    [name], [name], RN 08/07/2016, 1:26 PM

## 2016-08-07 NOTE — Progress Notes (Signed)
CRITICAL VALUE ALERT  Critical value received:  Hgb 6.4  Date of notification:  08/07/2016  Time of notification:  0235   Critical value read back:Yes.    Nurse who received alert:  Loura HaltSemone Krystina Strieter  MD notified (1st page):  Dr. Delton CoombesByrum  Time of first page:  0238  MD notified (2nd page):  Time of second page:  Responding MD:  Dr. Delton CoombesByrum  Time MD responded:  [phone]

## 2016-08-07 NOTE — Consult Note (Signed)
WOC Nurse wound consult note Reason for Consult: Consult requested for bilat buttocks.  Pt is having frequent bloody stools which are too thick to be contained by a Flexiseal.  Bilat buttocks are red and macerated with partial thickness skin loss; appearance is consistent with moisture associated skin damage related to incontinence.  A dressing would trap stool against skin.  Apply barrier cream and antifungal powder to repel moisture and protect skin with each turning and cleansing episode.  Pt is on a Sport low airloss bed to increase airflow.  No family present to discuss plan of care. Please re-consult if further assistance is needed.  Thank-you,  Cammie Mcgeeawn Jennamarie Goings MSN, RN, CWOCN, [place] English[place]CN-AP, CNS [phone]

## 2016-08-07 NOTE — Progress Notes (Signed)
The patient went for a bleeding scan yesterday which showed activity in the small bowel or colon. Discussions with her about doing angiography were done but she and the family elected not to proceed in that direction for fear of complications such as renal failure from the contrast media. Today her bleeding is no longer bright red per rectum as it was yesterday and it is darker in color now. Hemoglobin and hematocrit are 7.3 and 21.9 today. Her prothrombin time has increased to 35.2 and INR is up to 3.4.  We will continue medical management in ICU. If she has further bleeding of bright red blood she will need to reconsider angiography with embolization. She will need further vitamin K and fresh frozen plasma. Transfuse blood as needed.

## 2016-08-07 NOTE — Progress Notes (Signed)
eLink Physician-Brief Progress Note Patient Name: Diane AlexandersJeane Cook DOB: [birth year] MRN: [id number]   Date of Service  08/07/2016  HPI/Events of Note  INR 3.4 Active bleeding  eICU Interventions  4 u FFP     Intervention Category Intermediate Interventions: Bleeding - evaluation and treatment with blood products  ALVA,RAKESH V. 08/07/2016, 10:01 PM

## 2016-08-07 NOTE — Progress Notes (Signed)
PULMONARY / CRITICAL CARE MEDICINE   Name: Diane Cook MRN: [id number] DOB: 1956/05/13    ADMISSION DATE:  08/10/2016 CONSULTATION DATE:  08/17/2016  REFERRING MD:  Dalene Seltzer - EDP  CHIEF COMPLAINT:  Abd pain, dark stools  BRIEF SUMMARY:  Diane Cook is a 60 y.o. female with PMH  including known ETOH cirrhosis, chronic hep C.  She had recent admission 06/06/16 through 06/12/16 for GI bleed.  Had EGD 6/14 which revealed duodenitis, no varices or ulcers.  She was discharged and instructed to have outpatient follow up with GI. On 8/6, she presented again to [facility] ED with melena and hematemesis x 1 along with RLQ abd pain.  Symptoms had started 2 - 3 days earlier and persisted.  In ED, she had 1 episode of melena but no hematemesis or hematochezia.  Hgb was 5.6, she was started on PRBC transfusion.  In addition, she had AKI with hyperkalemia.  Admitted to [facility] for further evaluation.     SUBJECTIVE:  Levophed increased to 28 mcg, bleeding slowed per RN.  Black / tarry stool (was bright red yesterday).  O2 increased to 6L, tachypneic last night.  Currently off O2   VITAL SIGNS: BP (!) 89/48 (BP Location: Left Arm)   Pulse (!) 107   Temp 98.3 F (36.8 C) (Oral)   Resp (!) 23   Ht  (1.803 m)   Wt (!) 370 lb (167.8 kg)   SpO2 98%   BMI 51.60 kg/m   HEMODYNAMICS: CVP:  [12 mmHg-15 mmHg] 12 mmHg  VENTILATOR SETTINGS:    INTAKE / OUTPUT: I/O last 3 completed shifts: In: 4761.2 [I.V.:2010.2; Blood:2201; IV Piggyback:550] Out: 1232 [Urine:1232]   PHYSICAL EXAMINATION: General: Obese female, in NAD.   Neuro: A&O x3 to place & name, non-focal. Moves all 4s, active in bed HEENT: [place]/AT. PERRL, sclerae anicteric.   Cardiovascular: RRR, 3/6 SEM.  Lungs: Respirations even and unlabored.  CTA bilaterally, No W/R/R. Abdomen: Obese, BS x 4, soft, tender RLQ. Musculoskeletal: No gross deformities, 1+ non pitting edema.  Skin: Intact, warm, no rashes.  LABS:  PULMONARY  Recent Labs Lab  08/27/2016 2305 08/03/16 0240  HCO3  --  14.3*  TCO2 18 13.7  O2SAT  --  97.4    CBC  Recent Labs Lab 08/06/16 1545 08/07/16 0148 08/07/16 0730  HGB 6.7* 6.4* 7.3*  HCT 19.1* 18.7* 21.9*  WBC 12.8* 12.3* 12.9*  PLT 64* 59* 56*    COAGULATION  Recent Labs Lab 08/15/2016 2253 08/03/16 1902 08/04/16 0545 08/04/16 1900 08/07/16 0730  INR 4.64* 2.45 2.74 1.87 3.40    CARDIAC  Recent Labs Lab 08/03/16 0231 08/03/16 1758 08/03/16 2300 08/04/16 0545  TROPONINI 0.07* <0.03 0.05* 0.06*   No results for input(s): PROBNP in the last 168 hours.   CHEMISTRY  Recent Labs Lab 08/04/16 0545 08/21/2016 0220 08/06/16 0435 08/06/16 1545 08/07/16 0730  NA 132* 135 138 138 141  K 4.9 4.1 4.3 4.5 5.0  CL 102 100* 101 102 103  CO2 GLUCOSE 130* 120* 106* 100* 102*  BUN 65* 64* 74* 80* 90*  CREATININE 3.76* 3.19* 2.96* 3.23* 3.60*  CALCIUM 9.0 8.9 8.7* 8.8* 8.8*  MG  --  1.9  --   --  1.8  PHOS  --  5.1*  --   --  6.1*   Estimated Creatinine Clearance: 28 mL/min (by C-G formula based on SCr of 3.6 mg/dL).  LIVER  Recent Labs Lab 08-Aug-2016  2253 08/03/16 1902 08/04/16 0545 08/04/16 1900 08/23/2016 0220 08/07/16 0730  AST 250*  --   --   --  147* 111*  ALT 82*  --   --   --  58* 42  ALKPHOS 113  --   --   --  75 57  BILITOT 13.9*  --   --   --  16.0* 14.9*  PROT 6.7  --   --   --  6.8 4.6*  ALBUMIN 1.4*  --   --   --  2.0* 1.5*  INR 4.64* 2.45 2.74 1.87  --  3.40    INFECTIOUS  Recent Labs Lab 08/23/2016 2305 08/03/16 0633 08/06/16 1030  LATICACIDVEN 6.81* 3.7* 3.2*    ENDOCRINE CBG (last 3)   Recent Labs  08/04/16 1946 08/19/2016 0000 08/22/2016 0403  GLUCAP 131* 118* 120*     IMAGING x48h  - image(s) personally visualized  -   highlighted in bold Nm Gi Blood Loss  Result Date: 08/06/2016 CLINICAL DATA:  Evaluate for GI bleed. EXAM: NUCLEAR MEDICINE GASTROINTESTINAL BLEEDING SCAN TECHNIQUE: Sequential abdominal images were obtained  following intravenous administration of Tc-7667m labeled red blood cells. RADIOPHARMACEUTICALS:  25.8 mCi Tc-6367m in-vitro labeled red cells. COMPARISON:  None. FINDINGS: Markedly diminished exam detail secondary to excessive motion artifact. Patient was not able to remain still during the examination. Within this limitation there is evidence to suggest intraluminal radiotracer accumulation within the bowel loops. Compatible with the clinical history of GI bleed. On the first hour the radiotracer appears to accumulate in moved in the distribution of the left lower quadrant of the abdomen. On the second hour radiotracer can be seen crossing the midline from left to right. IMPRESSION: 1. Markedly diminished exam detail secondary to excessive motion artifact. 2. Based on the images acquired there is evidence of intraluminal bowel accumulation which may reflect a small or large bowel bleed. I cannot confidently say whether the activity is originating from small bowel or large bowel loops or within the right side of abdomen or left abdomen. Electronically Signed   By: Signa Kellaylor  Stroud M.D.   On: 08/06/2016 15:01   STUDIES:  CXR 8/7 > mild edema. EGD 8/9 >> normal esophagus, no varices, mildly erythematous mucosa was found in the stomach. The examined duodenum was normal.  No signs of bleeding. Mucosa somewhat friable. Bleeding Scan 8/10 >> active bleeding in the small bowel or colon  CULTURES: None.  ANTIBIOTICS: Rocephin 8/7 >>    LINES/TUBES: LIJ 8/7 >>   SIGNIFICANT EVENTS: 8/07  Admit for GI bleed, presumed upper. 8/08  FFP  + 2 units prbc 8/09  No chest pain. Or dyspnea Dark stool . Afebrile. 1 unit prbc for 6.7hgb 8/10  hgb 6.7 hgb, tx 1 unit prbc. BRBPR. Levophed 4-6 mcg 8/11  Levo up to 28 mcg   ASSESSMENT / PLAN:  DISCUSSION:  60 y/o F with hx ETOH cirrhosis and hep c, had recent UGIB in June 2017 due to duodenitis.  Admitted again 8/7 with GI bleed, presumed upper again.  Later developed  BRBPR.  Tag study concerning for bleeding in the small bowel or colon.    GASTROINTESTINAL A:   Hx ETOH cirrhosis, HCV.  UGIB - presumed upper.  Admission June 2017 where EGD revealed duodenitis. Repeat endoscopy 08/04/16 - faily normal UGI.  LGIB - frank blood per rectum, noted 8/10, family elected to hold off on arteriogram due to concern for renal failure P:   GI following, appreciate  input  Clear liquid per GI, defer diet progression to GI  Continue PPI   HEMATOLOGIC A:   AoC anemia - exacerbated due to acute UGIB. S/p multiple units PRBC's Chronic thrombocytopenia - likely due to bone marrow suppression from ETOH. Coagulopathy - likely due to liver dysfunction. VTE Prophylaxis. P:  Trend CBC  Volume replacement with PRVC Ttransfusion to maintain Hgb > 7  SCD's. Folic acid, thiamine  Vitamin K 10 mg IV x1  FFP x1  Continue empiric rocephin for GI prophylaxis   RENAL A AoCKD - presumed due to decreased PO intake + UGIB + N/V. Decreased UOP - noted 8/11 P:   Sodium bicarbonate at 25 ml/hr Trend BMP / UOP  Replace electrolytes as indicated   CARDIOVASCULAR A:  Hemorrhagic shock  Rule out Adrenal Insufficiency  P:  Continue levophed for goal MAP >65  Add vasopressin with rising levophed needs Assess cortisol to r/o AI  May need ALINE placement due to difficulty with BP readings and increasing vasopressor requirements  PULMONARY A: Hx asthma. Probable OSA / OHS. P:   Pulmonary hygiene. CPAP Qhs  INFECTIOUS A:   No indication of infection. P:   Monitor clinically. Empiric rocephin for GI prophylaxis   ENDOCRINE A:   No acute issues.   P:   No interventions required.  NEUROLOGIC A:   Hepatic encephalopathy Hx ETOH abuse P:   Thiamine / folate. ETOH cessation counseling. Lactulose - titrate to 3 soft BMs/d  Family updated:  Daughter updated at bedside 8/11   Interdisciplinary Family Meeting v Palliative Care Meeting:  FULL CODE    Canary Brim, NP-C  Pulmonary & Critical Care Pgr: [phone] or if no answer [phone] 08/07/2016, 12:12 PM

## 2016-08-07 NOTE — Progress Notes (Signed)
eLink Physician-Brief Progress Note Patient Name: Diane AlexandersJeane Hanaway DOB: May 25, 1956 MRN: [id number]   Date of Service  08/07/2016  HPI/Events of Note  Anemia - Hgb = 6.5 and Thrombocytopenia - Platelets = 45K.  eICU Interventions  Will order: 1. Transfuse 1 unit PRBC. 2. Transfuse 1 unit single donor platelets.      Intervention Category Intermediate Interventions: Thrombocytopenia - evaluation and management;Other:  [name],Romuald Mccaslin Eugene 08/07/2016, 8:05 PM

## 2016-08-07 NOTE — Progress Notes (Signed)
eLink Physician-Brief Progress Note Patient Name: Diane AlexandersJeane Linarez DOB: 1956/08/23 MRN: [id number]   Date of Service  08/07/2016  HPI/Events of Note  Lactic Acid = 3.2 >> 4.9. Hgb due at 7 PM. Getting 0.9 NaCl 500 mL iV over 30 minutes now.   eICU Interventions  Will order: 1. Bolus with another 500 mL of 0.9 NaCl IV over 30 minutes.  2. Await 7 PM Lactic Acid.      Intervention Category Major Interventions: Acid-Base disturbance - evaluation and management  Tarrance Januszewski Eugene 08/07/2016, 6:24 PM

## 2016-08-07 NOTE — Progress Notes (Signed)
eLink Physician-Brief Progress Note Patient Name: Emmit AlexandersJeane Poitras DOB: 1956/02/03 MRN: [id number]   Date of Service  08/07/2016  HPI/Events of Note  Pt continues to actively bleed.   Recent Labs Lab 08/06/16 1015 08/06/16 1545 08/07/16 0148  HGB 6.7* 6.7* 6.4*  HCT 19.7* 19.1* 18.7*  WBC 11.8* 12.8* 12.3*  PLT 56* 64* 59*     eICU Interventions  2u PRBC now     Intervention Category Intermediate Interventions: Bleeding - evaluation and treatment with blood products  Desirae Mancusi S. 08/07/2016, 2:38 AM

## 2016-08-08 ENCOUNTER — Encounter (HOSPITAL_COMMUNITY): Admission: EM | Disposition: E | Payer: Self-pay | Source: Home / Self Care | Attending: Emergency Medicine

## 2016-08-08 ENCOUNTER — Inpatient Hospital Stay (HOSPITAL_COMMUNITY): Payer: Medicaid Other

## 2016-08-08 ENCOUNTER — Encounter (HOSPITAL_COMMUNITY): Payer: Self-pay | Admitting: Interventional Radiology

## 2016-08-08 DIAGNOSIS — N179 Acute kidney failure, unspecified: Secondary | ICD-10-CM

## 2016-08-08 HISTORY — PX: IR GENERIC HISTORICAL: IMG1180011

## 2016-08-08 HISTORY — PX: FLEXIBLE SIGMOIDOSCOPY: SHX5431

## 2016-08-08 LAB — BASIC METABOLIC PANEL
Anion gap: 12 (ref 5–15)
BUN: 92 mg/dL — AB (ref 6–20)
CHLORIDE: 97 mmol/L — AB (ref 101–111)
CO2: 28 mmol/L (ref 22–32)
Calcium: 9.1 mg/dL (ref 8.9–10.3)
Creatinine, Ser: 3.97 mg/dL — ABNORMAL HIGH (ref 0.44–1.00)
GFR calc Af Amer: 13 mL/min — ABNORMAL LOW (ref >60)
GFR calc non Af Amer: 11 mL/min — ABNORMAL LOW (ref >60)
GLUCOSE: 120 mg/dL — AB (ref 65–99)
POTASSIUM: 4.8 mmol/L (ref 3.5–5.1)
Sodium: 137 mmol/L (ref 135–145)

## 2016-08-08 LAB — MAGNESIUM: Magnesium: 1.7 mg/dL (ref 1.7–2.4)

## 2016-08-08 LAB — CBC WITH DIFFERENTIAL/PLATELET
BASOS ABS: 0 10*3/uL (ref 0.0–0.1)
Basophils Relative: 0 %
EOS ABS: 0.4 10*3/uL (ref 0.0–0.7)
Eosinophils Relative: 3 %
HCT: 16.7 % — ABNORMAL LOW (ref 36.0–46.0)
Hemoglobin: 5.7 g/dL — CL (ref 12.0–15.0)
LYMPHS PCT: 23 %
Lymphs Abs: 3.1 10*3/uL (ref 0.7–4.0)
MCH: 32 pg (ref 26.0–34.0)
MCHC: 34.1 g/dL (ref 30.0–36.0)
MCV: 93.8 fL (ref 78.0–100.0)
MONO ABS: 1.5 10*3/uL — AB (ref 0.1–1.0)
Monocytes Relative: 11 %
NEUTROS PCT: 63 %
Neutro Abs: 8.5 10*3/uL — ABNORMAL HIGH (ref 1.7–7.7)
PLATELETS: 58 10*3/uL — AB (ref 150–400)
RBC: 1.78 MIL/uL — ABNORMAL LOW (ref 3.87–5.11)
RDW: 16.6 % — ABNORMAL HIGH (ref 11.5–15.5)
WBC: 13.5 10*3/uL — AB (ref 4.0–10.5)

## 2016-08-08 LAB — PREPARE PLATELET PHERESIS: UNIT DIVISION: 0

## 2016-08-08 LAB — CBC
HCT: 19 % — ABNORMAL LOW (ref 36.0–46.0)
HEMATOCRIT: 19.7 % — AB (ref 36.0–46.0)
HEMOGLOBIN: 6.8 g/dL — AB (ref 12.0–15.0)
Hemoglobin: 6.4 g/dL — CL (ref 12.0–15.0)
MCH: 32.7 pg (ref 26.0–34.0)
MCH: 31.2 pg (ref 26.0–34.0)
MCHC: 34.5 g/dL (ref 30.0–36.0)
MCHC: 33.7 g/dL (ref 30.0–36.0)
MCV: 94.7 fL (ref 78.0–100.0)
MCV: 92.7 fL (ref 78.0–100.0)
PLATELETS: 62 10*3/uL — AB (ref 150–400)
PLATELETS: 50 10*3/uL — AB (ref 150–400)
RBC: 2.08 MIL/uL — AB (ref 3.87–5.11)
RBC: 2.05 MIL/uL — AB (ref 3.87–5.11)
RDW: 17.6 % — ABNORMAL HIGH (ref 11.5–15.5)
RDW: 16.6 % — ABNORMAL HIGH (ref 11.5–15.5)
WBC: 16.7 10*3/uL — ABNORMAL HIGH (ref 4.0–10.5)
WBC: 15 10*3/uL — ABNORMAL HIGH (ref 4.0–10.5)

## 2016-08-08 LAB — PHOSPHORUS: Phosphorus: 6.4 mg/dL — ABNORMAL HIGH (ref 2.5–4.6)

## 2016-08-08 LAB — PREPARE FRESH FROZEN PLASMA: UNIT DIVISION: 0

## 2016-08-08 LAB — PREPARE RBC (CROSSMATCH)

## 2016-08-08 LAB — PROTIME-INR
INR: 2.54
INR: 2.36
INR: 2.82
PROTHROMBIN TIME: 27.8 s — AB (ref 11.4–15.2)
PROTHROMBIN TIME: 26.2 s — AB (ref 11.4–15.2)
Prothrombin Time: 30.2 seconds — ABNORMAL HIGH (ref 11.4–15.2)

## 2016-08-08 SURGERY — SIGMOIDOSCOPY, FLEXIBLE
Anesthesia: Moderate Sedation

## 2016-08-08 MED ORDER — IOPAMIDOL (ISOVUE-300) INJECTION 61%
INTRAVENOUS | Status: AC
  Administered 2016-08-08: 50 mL
  Filled 2016-08-08: qty 50

## 2016-08-08 MED ORDER — FENTANYL CITRATE (PF) 100 MCG/2ML IJ SOLN
INTRAMUSCULAR | Status: AC
Start: 2016-08-08 — End: 2016-08-09
  Filled 2016-08-08: qty 2

## 2016-08-08 MED ORDER — LIDOCAINE HCL 1 % IJ SOLN
INTRAMUSCULAR | Status: AC
  Filled 2016-08-08: qty 20

## 2016-08-08 MED ORDER — FENTANYL CITRATE (PF) 100 MCG/2ML IJ SOLN
INTRAMUSCULAR | Status: AC | PRN
  Administered 2016-08-08 (×5): 25 ug via INTRAVENOUS

## 2016-08-08 MED ORDER — FENTANYL CITRATE (PF) 100 MCG/2ML IJ SOLN
INTRAMUSCULAR | Status: AC
  Filled 2016-08-08: qty 2

## 2016-08-08 MED ORDER — SODIUM CHLORIDE 0.9% FLUSH
10.0000 mL | INTRAVENOUS | Status: DC | PRN
Start: 2016-08-08 — End: 2016-08-10

## 2016-08-08 MED ORDER — SODIUM CHLORIDE 0.9% FLUSH
10.0000 mL | Freq: Two times a day (BID) | INTRAVENOUS | Status: DC
  Administered 2016-08-08 – 2016-08-09 (×4): 10 mL

## 2016-08-08 MED ORDER — IOPAMIDOL (ISOVUE-300) INJECTION 61%
INTRAVENOUS | Status: AC
  Administered 2016-08-08: 25 mL
  Filled 2016-08-08: qty 150

## 2016-08-08 MED ORDER — SODIUM CHLORIDE 0.9 % IV SOLN
Freq: Once | INTRAVENOUS | Status: AC
  Administered 2016-08-08: 04:00:00 via INTRAVENOUS

## 2016-08-08 MED ORDER — MAGNESIUM SULFATE IN D5W 1-5 GM/100ML-% IV SOLN
1.0000 g | Freq: Once | INTRAVENOUS | Status: AC
  Administered 2016-08-08: 1 g via INTRAVENOUS
  Filled 2016-08-08: qty 100

## 2016-08-08 MED ORDER — SODIUM CHLORIDE 0.9 % IV SOLN: Freq: Once | INTRAVENOUS | Status: AC

## 2016-08-08 MED ORDER — SODIUM CHLORIDE 0.9 % IV SOLN
Freq: Once | INTRAVENOUS | Status: AC
  Administered 2016-08-09: 10 mL/h via INTRAVENOUS

## 2016-08-08 MED ORDER — EPINEPHRINE HCL 0.1 MG/ML IJ SOSY
PREFILLED_SYRINGE | INTRAMUSCULAR | Status: AC
  Filled 2016-08-08: qty 10

## 2016-08-08 MED ORDER — SODIUM CHLORIDE 0.9 % IV SOLN
Freq: Once | INTRAVENOUS | Status: AC
  Administered 2016-08-08: 12:00:00 via INTRAVENOUS

## 2016-08-08 MED ORDER — IOPAMIDOL (ISOVUE-300) INJECTION 61%
INTRAVENOUS | Status: AC
  Administered 2016-08-08: 75 mL
  Filled 2016-08-08: qty 100

## 2016-08-08 NOTE — Procedures (Signed)
Mesenteric angiogram IMA and SMA injected No evidence of active GI bleeding No comp/EBL

## 2016-08-08 NOTE — Procedures (Addendum)
INDICATION : Gi Bleed.   MEDICATIONS: None    CONSENT:  Informed consent was obtained from the daughter after explaining the indication, nature and risks of the procedure, including but not limited to bleeding, perforation, missed neoplasms/lesions, reaction to medications and cardiorespiratory complications. Opportunity for questions was provided and appropriate answers were provided.  PROCEDURE:  Patient was placed in the left lateral decubitus position. EKG, vital signs and oxygen saturation were monitored throughout the procedure.  Patient received supplemental oxygen via nasal canula throughout the procedure.  Procedure was done without sedation.   Difficult Procedure because of body habitus . Digital rectal examination did not reveal any intraluminal mass. Prostate was not evaluated. Colonoscope was introduced via the anal canal and advanced into the rectum, sigmoid colon, descending colon. Scope was advance up to 60 cm. There was bright blood covering entire colonic mucosa. No active bleeding source identified.      TOLERANCE/VITAL SIGNS:  Patient tolerated the procedure well. EKG, vital signs and oxygen saturation remained within normal limits throughout the procedure.  COMPLICATIONS:  There were no immediate complications.  RECOMMENDATIONS: - IR embolization  - Additional FFP and PRBC is  ordered  - D.W ICU team  Procedure was done at bedside in ICU. Critical care time more than 30 minutes.

## 2016-08-08 NOTE — Op Note (Signed)
[facility] Patient Name: Diane Cook Procedure Date : 08/12/2016 MRN: [id number] Attending MD: Kathi Der , MD Date of Birth: 10-Aug-1956 CSN: [id number] Age: 60 Admit Type: Inpatient Procedure:                Flexible Sigmoidoscopy Indications:              Hematochezia Providers:                Kathi Der, MD, Will Bonnet RN, RN, Lorenda Ishihara, Technician Referring MD:              Medicines:                None Complications:            No immediate complications. Estimated Blood Loss:     Estimated blood loss: none. Procedure:                Pre-Anesthesia Assessment:                           - Prior to the procedure, a History and Physical                            was performed, and patient medications and                            allergies were reviewed. The patient's tolerance of                            previous anesthesia was also reviewed. The risks                            and benefits of the procedure and the sedation                            options and risks were discussed with the patient.                            All questions were answered, and informed consent                            was obtained. Prior Anticoagulants: The patient has                            taken no previous anticoagulant or antiplatelet                            agents. ASA Grade Assessment: III - A patient with                            severe systemic disease. After reviewing the risks                            and benefits, the  patient was deemed in                            satisfactory condition to undergo the procedure.                           After obtaining informed consent, the scope was                            passed under direct vision. The EC-[id number] ([id number]                            scope was introduced through the anus and advanced                            to the the splenic flexure. The flexible                             sigmoidoscopy was performed with moderate                            difficulty. Successful completion of the procedure                            was aided by straightening and shortening the scope                            to obtain bowel loop reduction. The patient                            tolerated the procedure well. The quality of the                            bowel preparation was N/A. Scope In: Scope Out: Findings:      Red blood was found in the rectum, in the recto-sigmoid colon, in the       sigmoid colon, in the descending colon and at the splenic flexure. Impression:               - Blood in the rectum, in the recto-sigmoid colon,                            in the sigmoid colon, in the descending colon and                            at the splenic flexure.                           - No specimens collected. Recommendation:           - Continue present medications.                           - NPO.                           -  Recommed IR guided Embolization Procedure Code(s):        --- Professional ---                           [phone], Sigmoidoscopy, flexible; diagnostic,                            including collection of specimen(s) by brushing or                            washing, when performed (separate procedure) Diagnosis Code(s):        --- Professional ---                           K62.5, Hemorrhage of anus and rectum                           K92.2, Gastrointestinal hemorrhage, unspecified                           K92.1, Melena (includes Hematochezia) CPT copyright 2016 American Medical Association. All rights reserved. The codes documented in this report are preliminary and upon coder review may  be revised to meet current compliance requirements. Kathi Der, MD Kathi Der, MD 08/07/2016 12:01:23 PM Number of Addenda: 0

## 2016-08-08 NOTE — Progress Notes (Signed)
[facility] Progress [facility]  Diane Cook 60 y.o. [birth year]   Subjective: Patient continues to have GI bleed. D/W Nursing staff. Mixed dark and bright blood. Negative EGD recently. HGB dropped and is receiving PRBC at this time.   Objective: Vital signs in last 24 hours: Vitals:   09/05/2016 0700 09/05/2016 0752  BP: 136/69   Pulse: (!) 103   Resp: 14   Temp:  97.7 F (36.5 C)    Physical Exam:  General:  Alert, cooperative, Mild  distress,   Head:  Normocephalic, without obvious abnormality, atraumatic  Eyes:  EOM's intact,   Lungs:   Anterior exam only. Diminished breathsounds   Heart:  Regular rate and rhythm, S1, S2 normal  Abdomen:   Soft, non-tender, bowel sounds active all four quadrants,  no masses,   Extremities: Extremities normal, atraumatic, no  edema  Pulses: 2+ and symmetric    Lab Results:  Recent Labs  08/07/16 0730 08/07/16 1711 09/05/2016 0512  NA 141 137 137  K 5.0 4.8 4.8  CL 103 101 97*  CO2 29 26 28   GLUCOSE 102* 103* 120*  BUN 90* 93* 92*  CREATININE 3.60* 3.94* 3.97*  CALCIUM 8.8* 8.8* 9.1  MG 1.8  --  1.7  PHOS 6.1*  --  6.4*    Recent Labs  08/07/16 0730 08/07/16 1711  AST 111* 119*  ALT 42 46  ALKPHOS 57 56  BILITOT 14.9* 15.3*  PROT 4.6* 4.6*  ALBUMIN 1.5* 1.5*    Recent Labs  08/07/16 1859 09/05/2016 0131  WBC 11.4* 16.7*  HGB 6.5* 6.8*  HCT 19.0* 19.7*  MCV 95.5 94.7  PLT 45* 62*    Recent Labs  08/07/16 0730  LABPROT 35.2*  INR 3.40      Assessment/Plan:  #1. GI bleed with recent endoscopy showing duodenitis with erosions, and no evidence of esophageal varices.   #2. Alcoholic/hepatitis C cirrhosis of the liver  #3. Coagulopathy secondary to #1  #4. Thrombocytopenia secondary to #1, as well as bone marrow suppression from continued use of alcohol  #5. Hepatic encephalopathy  #6. Jaundice secondary to #1   PLAN ------ - Bleeding scan showed activity either in small or large bowel.  Family declined IR embolization . - Difficult situation given Decompensated cirrhosis and other co morbidities.  - Recheck INR .  - Transfuse as needed  - Not sure if Lower Endoscopy will be helpful it its small bowel bleeding source but given left sided activity will attempt flex sig.   Ersie Savino May 21, 2016, 7:56 AM  Pager [phone]  If no answer or after 5 PM call [phone]

## 2016-08-08 NOTE — Progress Notes (Signed)
eLink Physician-Brief Progress Note Patient Name: Diane Cook DOB: 1956-09-24 MRN: [id number]   Date of Service  08/23/2016  HPI/Events of Note  hgb 5.7  eICU Interventions  Transfuse 2 units PRBC     Intervention Category Intermediate Interventions: Bleeding - evaluation and treatment with blood products  Henry RusselSMITH, [name], P 07/28/2016, 11:13 PM

## 2016-08-08 NOTE — Progress Notes (Signed)
PULMONARY / CRITICAL CARE MEDICINE   Name: Diane Cook MRN: [id number] DOB: 10/16/1956    ADMISSION DATE:  08/22/2016 CONSULTATION DATE:  08/13/2016  REFERRING MD:  Dalene Seltzer - EDP  CHIEF COMPLAINT:  Abd pain, dark stools  BRIEF SUMMARY:  Diane Cook is a 60 y.o. female with PMH  including known ETOH cirrhosis, chronic hep C.  She had recent admission 06/06/16 through 06/12/16 for GI bleed.  Had EGD 6/14 which revealed duodenitis, no varices or ulcers.  She was discharged and instructed to have outpatient follow up with GI. On 8/6, she presented again to [facility] ED with melena and hematemesis x 1 along with RLQ abd pain.  Symptoms had started 2 - 3 days earlier and persisted.  In ED, she had 1 episode of melena but no hematemesis or hematochezia.  Hgb was 5.6, she was started on PRBC transfusion.  In addition, she had AKI with hyperkalemia.  Admitted to [facility] for further evaluation.     STUDIES:  CXR 8/7 > mild edema. EGD 8/9 >> normal esophagus, no varices, mildly erythematous mucosa was found in the stomach. The examined duodenum was normal.  No signs of bleeding. Mucosa somewhat friable. Bleeding Scan 8/10 >> active bleeding in the small bowel or colon  CULTURES: None.  ANTIBIOTICS: Rocephin 8/7 >>    LINES/TUBES: LIJ 8/7 >>   SIGNIFICANT EVENTS: 8/07  Admit for GI bleed, presumed upper. 8/08  FFP  + 2 units prbc 8/09  No chest pain. Or dyspnea Dark stool . Afebrile. 1 unit prbc for 6.7hgb 8/10  hgb 6.7 hgb, tx 1 unit prbc. BRBPR. Levophed 4-6 mcg 8/11  Levo up to 28 mcg 08/07/16 -   Levophed increased to 28 mcg, bleeding slowed per RN.  Black / tarry stool (was bright red yesterday).  O2 increased to 6L, tachypneic last night.  Currently off O2  . SUBJECTIVE/OVERNIGHT/INTERVAL HX 08-31-16 - s/ p 1PRBC and 1 platelet last night and 4 FFP  -> Flex Sig this AM - > full of blood -> repeat FFP and 1 Unit PRBC ordered/ 1 unit platelet (daghter indicated yesterday - ok for IR angiography as  measure for bleeding to be controlled even it if means death on table and renal failure permanent). Worsenign AKi/ATN t 3.97   VITAL SIGNS: BP (!) 100/32   Pulse (!) 103   Temp 98 F (36.7 C) (Oral)   Resp 15   Ht 5\' 11"  (1.803 m)   Wt (!) 172.4 kg (380 lb)   SpO2 97%   BMI 53.00 kg/m   HEMODYNAMICS: CVP:  [13 mmHg-20 mmHg] 20 mmHg  VENTILATOR SETTINGS:    INTAKE / OUTPUT: I/O last 3 completed shifts: In: 6502.6 [I.V.:2012.6; Blood:3440; IV Piggyback:1050] Out: 1011 [Urine:1010; Stool:1]   PHYSICAL EXAMINATION: General: Obese female, in NAD.   Neuro: A&O x2 to place & name, non-focal. Moves all 4s, active in bed. Not agitated. Per GI = understands absics HEENT: [place]/AT. PERRL, sclerae anicteric.   Cardiovascular: RRR, 3/6 SEM.  Lungs: Respirations even and unlabored.  CTA bilaterally, No W/R/R. Abdomen: Obese, BS x 4, soft, tender RLQ. Musculoskeletal: No gross deformities, 1+ non pitting edema.  Skin: Intact, warm, no rashes.  LABS:  PULMONARY  Recent Labs Lab 08/10/2016 2305 08/03/16 0240  HCO3  --  14.3*  TCO2 18 13.7  O2SAT  --  97.4    CBC  Recent Labs Lab 08/07/16 1859 08-31-2016 0131 08-31-2016 0926  HGB 6.5* 6.8* 6.4*  HCT 19.0* 19.7*  19.0*  WBC 11.4* 16.7* 15.0*  PLT 45* 62* 50*    COAGULATION  Recent Labs Lab 08/03/16 1902 08/04/16 0545 08/04/16 1900 08/07/16 0730 2016/08/11 0926  INR 2.45 2.74 1.87 3.40 2.54    CARDIAC  Recent Labs Lab 08/03/16 0231 08/03/16 1758 08/03/16 2300 08/04/16 0545  TROPONINI 0.07* <0.03 0.05* 0.06*   No results for input(s): PROBNP in the last 168 hours.   CHEMISTRY  Recent Labs Lab 08/12/2016 0220 08/06/16 0435 08/06/16 1545 08/07/16 0730 08/07/16 1711 2016/08/11 0512  NA 135 138 138 141 137 137  K 4.1 4.3 4.5 5.0 4.8 4.8  CL 100* 101 102 103 101 97*  CO2 26 29 28 29 26 28   GLUCOSE 120* 106* 100* 102* 103* 120*  BUN 64* 74* 80* 90* 93* 92*  CREATININE 3.19* 2.96* 3.23* 3.60* 3.94* 3.97*   CALCIUM 8.9 8.7* 8.8* 8.8* 8.8* 9.1  MG 1.9  --   --  1.8  --  1.7  PHOS 5.1*  --   --  6.1*  --  6.4*   Estimated Creatinine Clearance: 25.8 mL/min (by C-G formula based on SCr of 3.97 mg/dL).  LIVER  Recent Labs Lab 07/29/2016 2253 08/03/16 1902 08/04/16 0545 08/04/16 1900 08/23/2016 0220 08/07/16 0730 08/07/16 1711 2016-08-11 0926  AST 250*  --   --   --  147* 111* 119*  --   ALT 82*  --   --   --  58* 42 46  --   ALKPHOS 113  --   --   --  75 57 56  --   BILITOT 13.9*  --   --   --  16.0* 14.9* 15.3*  --   PROT 6.7  --   --   --  6.8 4.6* 4.6*  --   ALBUMIN 1.4*  --   --   --  2.0* 1.5* 1.5*  --   INR 4.64* 2.45 2.74 1.87  --  3.40  --  2.54    INFECTIOUS  Recent Labs Lab 08/03/16 0633 08/06/16 1030 08/07/16 1711  LATICACIDVEN 3.7* 3.2* 4.9*    ENDOCRINE CBG (last 3)  No results for input(s): GLUCAP in the last 72 hours.   IMAGING x48h  - image(s) personally visualized  -   highlighted in bold Nm Gi Blood Loss  Result Date: 08/06/2016 CLINICAL DATA:  Evaluate for GI bleed. EXAM: NUCLEAR MEDICINE GASTROINTESTINAL BLEEDING SCAN TECHNIQUE: Sequential abdominal images were obtained following intravenous administration of Tc-38m labeled red blood cells. RADIOPHARMACEUTICALS:  25.8 mCi Tc-63m in-vitro labeled red cells. COMPARISON:  None. FINDINGS: Markedly diminished exam detail secondary to excessive motion artifact. Patient was not able to remain still during the examination. Within this limitation there is evidence to suggest intraluminal radiotracer accumulation within the bowel loops. Compatible with the clinical history of GI bleed. On the first hour the radiotracer appears to accumulate in moved in the distribution of the left lower quadrant of the abdomen. On the second hour radiotracer can be seen crossing the midline from left to right. IMPRESSION: 1. Markedly diminished exam detail secondary to excessive motion artifact. 2. Based on the images acquired there is  evidence of intraluminal bowel accumulation which may reflect a small or large bowel bleed. I cannot confidently say whether the activity is originating from small bowel or large bowel loops or within the right side of abdomen or left abdomen. Electronically Signed   By: Signa Kell M.D.   On: 08/06/2016 15:01   ASSESSMENT /  PLAN:  DISCUSSION:  60 y/o F with hx ETOH cirrhosis and hep c, had recent UGIB in June 2017 due to duodenitis.  Admitted again 8/7 with GI bleed, presumed upper again.  Later developed BRBPR.  Tag study concerning for bleeding in the small bowel or colon.    GASTROINTESTINAL A:   Hx ETOH cirrhosis, HCV.  But No varices  This admit:   - Active GIB and s/p multiple blood prodcuts UGIB -   -   Admission June 2017 where EGD revealed duodenitis. Repeat endoscopy 08/04/16 - faily normal UGI.    - Nuc med scan - ?  LLQ lower gi bleeed 08/06/16   - flex sig 08/07/16 - active bleeding  P:   NPO PPI IR angiogram stat - called Dr Bonnielee Haff - d/w GI Dr Darcus Austin - need to prevent death; accept risk of permanent renal failure and sedation risk  HEMATOLOGIC A:   AoC anemia - exacerbated due to acute UGIB. S/p multiple units PRBC's Chronic thrombocytopenia - likely due to bone marrow suppression from ETOH. Coagulopathy - likely due to liver dysfunction.   - ongoing multiple transfusion 09-03-2016  P:  Trend CBC ; q6h Ttransfusion to maintain Hgb > 7 or for shock SCD's. Folic acid, thiamine  Continue empiric rocephin for GI prophylaxis   RENAL A AoCKD - presumed due to decreased PO intake + UGIB + N/V. Decreased UOP - noted 8/11 P:   Sodium bicarbonate at 25 ml/hr Trend BMP / UOP  Replace electrolytes as indicated   CARDIOVASCULAR A:  Hemorrhagic shock  Rule out Adrenal Insufficiency    - on levophed and vasopression P:  Continue levophed and vaso for goal MAP >65  Assess cortisol to r/o AI  May need ALINE placement due to difficulty with BP readings and increasing  vasopressor requirements  PULMONARY A: Hx asthma. Probable OSA / OHS. P:   Pulmonary hygiene. CPAP Qhs  INFECTIOUS A:   No indication of infection. P:   Monitor clinically. Empiric rocephin for GI prophylaxis   ENDOCRINE A:   No acute issues.   P:   No interventions required.  NEUROLOGIC A:   Hepatic encephalopathy Hx ETOH abuse  -very mild confusion only P:   Thiamine / folate. ETOH cessation counseling. Lactulose - titrate to 3 soft BMs/d  Family updated:  Daughter updated at bedside 8/11   Interdisciplinary Family Meeting v Palliative Care Meeting:  FULL CODE  Interdisciplinary Goals of Care Family Meeting    Date carried out:: 08/07/2016 (she is not at bedisde 09/03/2016 _  Location of the meeting: Bedside  Member's involved: Physician, Bedside Registered Nurse, Family Member or next of kin and Other: daughter  Durable Power of Pensions consultant or Environmental health practitioner: daughter    Discussion: We discussed goals of care for [employer] .  On 08/07/16 - full code. Full medical care. Do IR angiography even if it means permanenet renal failure  Code status: Full Code  Disposition: Continue current acute care  Time spent for the meeting: 10 min ion 08/07/16. She is not at bedside   [facility] 2016-09-03 12:04 PM      The patient is critically ill with multiple organ systems failure and requires high complexity decision making for assessment and support, frequent evaluation and titration of therapies, application of advanced monitoring technologies and extensive interpretation of multiple databases.   Critical Care Time devoted to patient care services described in this note is  45  Minutes. This time reflects time  of care of this signee Dr Kalman ShanMurali Brantlee Hinde. This critical care time does not reflect procedure time, or teaching time or supervisory time of PA/NP/Med student/Med Resident etc but could involve care discussion time    Dr. Kalman ShanMurali  Damesha Lawler, M.D., [facility].C.P Pulmonary and Critical Care Medicine Staff Physician [facility] [place] [facility]monary and Critical Care Pager: [phone], If no answer or between  15:00h - 7:00h: call [phone]  08/19/2016 11:43 AM

## 2016-08-08 NOTE — Progress Notes (Signed)
Patient ID: [name], female   DOB: 06/21/1956, 60 y.o.   MRN: [id number] The patient continues to suffer from bright red blood per rectum and dropping HCT. Her last Hb was 6.4. Flex sigmoidoscopy yielded no source of bleeding. Bright red blood was noted throughout her descending colon. I was asked by CCM  to move forward with angiogram and possible embolization. I discussed the risks with Otho NajjarJohn Hutchinson, including renal failure, bleeding, and death. The patient is full code status. He understands and consents.

## 2016-08-08 NOTE — Progress Notes (Signed)
Patient refusing to wear CPAP tonight. Has been refusing last 2 nights per RT.

## 2016-08-09 ENCOUNTER — Inpatient Hospital Stay (HOSPITAL_COMMUNITY): Payer: Medicaid Other | Admitting: Anesthesiology

## 2016-08-09 DIAGNOSIS — I469 Cardiac arrest, cause unspecified: Secondary | ICD-10-CM

## 2016-08-09 DIAGNOSIS — K922 Gastrointestinal hemorrhage, unspecified: Secondary | ICD-10-CM

## 2016-08-09 DIAGNOSIS — N179 Acute kidney failure, unspecified: Secondary | ICD-10-CM

## 2016-08-09 LAB — PREPARE FRESH FROZEN PLASMA
UNIT DIVISION: 0
UNIT DIVISION: 0
Unit division: 0
Unit division: 0
Unit division: 0
Unit division: 0

## 2016-08-09 LAB — PREPARE PLATELET PHERESIS: Unit division: 0

## 2016-08-09 LAB — COMPREHENSIVE METABOLIC PANEL
ALK PHOS: 44 U/L (ref 38–126)
ALT: 97 U/L — ABNORMAL HIGH (ref 14–54)
ANION GAP: 20 — AB (ref 5–15)
AST: 354 U/L — AB (ref 15–41)
Albumin: 1.2 g/dL — ABNORMAL LOW (ref 3.5–5.0)
BILIRUBIN TOTAL: 10.9 mg/dL — AB (ref 0.3–1.2)
BUN: 100 mg/dL — AB (ref 6–20)
CALCIUM: 9.4 mg/dL (ref 8.9–10.3)
CO2: 17 mmol/L — ABNORMAL LOW (ref 22–32)
Chloride: 98 mmol/L — ABNORMAL LOW (ref 101–111)
Creatinine, Ser: 5.65 mg/dL — ABNORMAL HIGH (ref 0.44–1.00)
GFR calc Af Amer: 8 mL/min — ABNORMAL LOW (ref >60)
GFR, EST NON AFRICAN AMERICAN: 7 mL/min — AB (ref >60)
GLUCOSE: 79 mg/dL (ref 65–99)
POTASSIUM: 5 mmol/L (ref 3.5–5.1)
Sodium: 135 mmol/L (ref 135–145)
TOTAL PROTEIN: 3.1 g/dL — AB (ref 6.5–8.1)

## 2016-08-09 LAB — CBC
HEMATOCRIT: 17.7 % — AB (ref 36.0–46.0)
HEMOGLOBIN: 5.8 g/dL — AB (ref 12.0–15.0)
MCH: 31.7 pg (ref 26.0–34.0)
MCHC: 32.8 g/dL (ref 30.0–36.0)
MCV: 96.7 fL (ref 78.0–100.0)
Platelets: 32 10*3/uL — ABNORMAL LOW (ref 150–400)
RBC: 1.83 MIL/uL — ABNORMAL LOW (ref 3.87–5.11)
RDW: 16.5 % — AB (ref 11.5–15.5)
WBC: 18.2 10*3/uL — ABNORMAL HIGH (ref 4.0–10.5)

## 2016-08-09 LAB — BLOOD GAS, ARTERIAL
Acid-base deficit: 12.7 mmol/L — ABNORMAL HIGH (ref 0.0–2.0)
BICARBONATE: 16 meq/L — AB (ref 20.0–24.0)
Drawn by: 236041
FIO2: 100
LHR: 16 {breaths}/min
MECHVT: 500 mL
O2 SAT: 99.3 %
PATIENT TEMPERATURE: 98.6
PCO2 ART: 60.9 mmHg — AB (ref 35.0–45.0)
PEEP: 14 cmH2O
PO2 ART: 333 mmHg — AB (ref 80.0–100.0)
TCO2: 17.9 mmol/L (ref 0–100)
pH, Arterial: 7.049 — CL (ref 7.350–7.450)

## 2016-08-09 LAB — POCT I-STAT 3, ART BLOOD GAS (G3+)
Bicarbonate: 24.9 mEq/L — ABNORMAL HIGH (ref 20.0–24.0)
O2 SAT: 98 %
TCO2: 26 mmol/L (ref 0–100)
pCO2 arterial: 37.7 mmHg (ref 35.0–45.0)
pH, Arterial: 7.426 (ref 7.350–7.450)
pO2, Arterial: 93 mmHg (ref 80.0–100.0)

## 2016-08-09 LAB — CBC WITH DIFFERENTIAL/PLATELET
BASOS PCT: 0 %
BASOS PCT: 1 %
Basophils Absolute: 0 10*3/uL (ref 0.0–0.1)
Basophils Absolute: 0.2 10*3/uL — ABNORMAL HIGH (ref 0.0–0.1)
EOS ABS: 0.8 10*3/uL — AB (ref 0.0–0.7)
EOS PCT: 3 %
EOS PCT: 5 %
Eosinophils Absolute: 0.4 10*3/uL (ref 0.0–0.7)
HEMATOCRIT: 20.1 % — AB (ref 36.0–46.0)
HEMATOCRIT: 16.4 % — AB (ref 36.0–46.0)
Hemoglobin: 6.9 g/dL — CL (ref 12.0–15.0)
Hemoglobin: 5.6 g/dL — CL (ref 12.0–15.0)
LYMPHS ABS: 2.7 10*3/uL (ref 0.7–4.0)
LYMPHS PCT: 20 %
Lymphocytes Relative: 16 %
Lymphs Abs: 2.9 10*3/uL (ref 0.7–4.0)
MCH: 31.7 pg (ref 26.0–34.0)
MCH: 32.2 pg (ref 26.0–34.0)
MCHC: 34.3 g/dL (ref 30.0–36.0)
MCHC: 34.1 g/dL (ref 30.0–36.0)
MCV: 92.2 fL (ref 78.0–100.0)
MCV: 94.3 fL (ref 78.0–100.0)
MONO ABS: 2.2 10*3/uL — AB (ref 0.1–1.0)
MONOS PCT: 15 %
Monocytes Absolute: 2.2 10*3/uL — ABNORMAL HIGH (ref 0.1–1.0)
Monocytes Relative: 13 %
NEUTROS PCT: 62 %
Neutro Abs: 8.9 10*3/uL — ABNORMAL HIGH (ref 1.7–7.7)
Neutro Abs: 11 10*3/uL — ABNORMAL HIGH (ref 1.7–7.7)
Neutrophils Relative %: 65 %
Platelets: 43 10*3/uL — ABNORMAL LOW (ref 150–400)
Platelets: 31 10*3/uL — ABNORMAL LOW (ref 150–400)
RBC: 2.18 MIL/uL — AB (ref 3.87–5.11)
RBC: 1.74 MIL/uL — AB (ref 3.87–5.11)
RDW: 15.6 % — ABNORMAL HIGH (ref 11.5–15.5)
RDW: 16.4 % — AB (ref 11.5–15.5)
WBC: 14.4 10*3/uL — ABNORMAL HIGH (ref 4.0–10.5)
WBC: 16.9 10*3/uL — AB (ref 4.0–10.5)

## 2016-08-09 LAB — PROTIME-INR
INR: 3.47
INR: 3.32
INR: 1.98
PROTHROMBIN TIME: 35.7 s — AB (ref 11.4–15.2)
PROTHROMBIN TIME: 34.5 s — AB (ref 11.4–15.2)
PROTHROMBIN TIME: 22.8 s — AB (ref 11.4–15.2)

## 2016-08-09 LAB — BASIC METABOLIC PANEL
ANION GAP: 12 (ref 5–15)
BUN: 104 mg/dL — ABNORMAL HIGH (ref 6–20)
CALCIUM: 9 mg/dL (ref 8.9–10.3)
CHLORIDE: 95 mmol/L — AB (ref 101–111)
CO2: 28 mmol/L (ref 22–32)
Creatinine, Ser: 5.01 mg/dL — ABNORMAL HIGH (ref 0.44–1.00)
GFR calc Af Amer: 10 mL/min — ABNORMAL LOW (ref >60)
GFR calc non Af Amer: 8 mL/min — ABNORMAL LOW (ref >60)
GLUCOSE: 108 mg/dL — AB (ref 65–99)
Potassium: 5.1 mmol/L (ref 3.5–5.1)
Sodium: 135 mmol/L (ref 135–145)

## 2016-08-09 LAB — HEPATIC FUNCTION PANEL
ALBUMIN: 1.5 g/dL — AB (ref 3.5–5.0)
ALT: 50 U/L (ref 14–54)
AST: 152 U/L — AB (ref 15–41)
Alkaline Phosphatase: 46 U/L (ref 38–126)
BILIRUBIN TOTAL: 15.3 mg/dL — AB (ref 0.3–1.2)
Bilirubin, Direct: 7.8 mg/dL — ABNORMAL HIGH (ref 0.1–0.5)
Indirect Bilirubin: 7.5 mg/dL — ABNORMAL HIGH (ref 0.3–0.9)
TOTAL PROTEIN: 4.2 g/dL — AB (ref 6.5–8.1)

## 2016-08-09 LAB — MAGNESIUM: Magnesium: 1.9 mg/dL (ref 1.7–2.4)

## 2016-08-09 LAB — LACTIC ACID, PLASMA
LACTIC ACID, VENOUS: 14.2 mmol/L — AB (ref 0.5–1.9)
Lactic Acid, Venous: 3.7 mmol/L (ref 0.5–1.9)

## 2016-08-09 LAB — PHOSPHORUS: Phosphorus: 7 mg/dL — ABNORMAL HIGH (ref 2.5–4.6)

## 2016-08-09 MED ORDER — SODIUM CHLORIDE 0.9 % IV BOLUS (SEPSIS)
1000.0000 mL | Freq: Once | INTRAVENOUS | Status: AC
  Administered 2016-08-09: 1000 mL via INTRAVENOUS

## 2016-08-09 MED ORDER — SODIUM CHLORIDE 0.9 % IV SOLN
Freq: Once | INTRAVENOUS | Status: AC
  Administered 2016-08-09: 11:00:00 via INTRAVENOUS

## 2016-08-09 MED ORDER — SODIUM BICARBONATE 8.4 % IV SOLN
INTRAVENOUS | Status: AC
  Filled 2016-08-09: qty 50

## 2016-08-09 MED ORDER — MORPHINE BOLUS VIA INFUSION
5.0000 mg | INTRAVENOUS | Status: DC | PRN
  Filled 2016-08-09: qty 20

## 2016-08-09 MED ORDER — MORPHINE SULFATE (PF) 4 MG/ML IV SOLN
INTRAVENOUS | Status: AC
  Administered 2016-08-09: 4 mg
  Filled 2016-08-09: qty 1

## 2016-08-09 MED ORDER — COAGULATION FACTOR VIIA RECOMB 1 MG IV SOLR
90.0000 ug/kg | Freq: Once | INTRAVENOUS | Status: AC
  Administered 2016-08-09: 16000 ug via INTRAVENOUS
  Filled 2016-08-09: qty 16

## 2016-08-09 MED ORDER — MORPHINE SULFATE 25 MG/ML IV SOLN
10.0000 mg/h | INTRAVENOUS | Status: DC
  Administered 2016-08-09: 5 mg/h via INTRAVENOUS
  Filled 2016-08-09: qty 10

## 2016-08-09 MED ORDER — SODIUM CHLORIDE 0.9 % IV SOLN: Freq: Once | INTRAVENOUS | Status: DC

## 2016-08-09 MED ORDER — VITAMIN K1 10 MG/ML IJ SOLN
10.0000 mg | Freq: Once | INTRAVENOUS | Status: AC
  Administered 2016-08-09: 10 mg via INTRAVENOUS
  Filled 2016-08-09: qty 1

## 2016-08-10 ENCOUNTER — Encounter (HOSPITAL_COMMUNITY): Payer: Self-pay | Admitting: Gastroenterology

## 2016-08-10 LAB — TYPE AND SCREEN
ABO/RH(D): O POS
Antibody Screen: NEGATIVE
Unit division: 0
Unit division: 0
Unit division: 0
Unit division: 0
Unit division: 0
Unit division: 0
Unit division: 0
Unit division: 0
Unit division: 0

## 2016-08-10 LAB — PREPARE FRESH FROZEN PLASMA
UNIT DIVISION: 0
UNIT DIVISION: 0
Unit division: 0

## 2016-08-10 MED FILL — Medication: Qty: 1 | Status: AC

## 2016-08-13 ENCOUNTER — Encounter: Payer: Self-pay | Admitting: Family Medicine

## 2016-08-13 ENCOUNTER — Telehealth: Payer: Self-pay

## 2016-08-13 NOTE — Telephone Encounter (Signed)
On 08/13/2016 I received a death certificate from [employer] [facility] [employer] (original). The death certificate is for cremation. The patient is a patient of Doctor Ramaswamy.  The death certificate will be taken to [employer] this pm for signature.  On 08/14/2016 I received the death certificate back from Doctor Ramaswamy. I got the death certificate ready and called the funeral home to let them know the death certificate is ready for pickup. I also faxed a copy per the funeral home request.

## 2016-08-28 NOTE — Progress Notes (Signed)
Received call from Critical care MD, Dr. Marchelle Gearingamaswamy.  Patient with ongoing bleeding and possible hemorrhagic shock.currenrly on pressure support. Multiple attempts to localize bleeding has failed so far.(EGD. Flex sig, Angiography)  Patient also has possible respiratory failure and confusion. Also Elevated INR from her liver cirrhosis along with thrombocytopenia.  - D/W Dr. Marchelle Gearingamaswamy. Consider trail of Factor VIIa .   Patient remains critically ill and prognosis is poor.

## 2016-08-28 NOTE — Progress Notes (Signed)
'   Update:  Through course of day ongoing bleeding and now maxed on pressors, ongoing bleed, coag getting worse despite multiple blood products. D/w CCS - too high risk for surgery and will need colonoscpy infor to operate. Per GI - needs bowel prepe and coag correctino to do colonoscopy and in setting of massive bleeding cannot visualize and per their experience mostly  Unhelpful. All parties feel situation very high risk.   Plan - will try factor 7a as last ditch - d/w daughter code status - myself and APP -> she is contemplating DNR but full medical care but still not emotionally not ready though intellectually she knows is right decision     The patient is critically ill with multiple organ systems failure and requires high complexity decision making for assessment and support, frequent evaluation and titration of therapies, application of advanced monitoring technologies and extensive interpretation of multiple databases.   Critical Care Time devoted to patient care services described in this note is  45  Minutes. This time reflects time of care of this signee Dr Kalman ShanMurali Song Garris. This critical care time does not reflect procedure time, or teaching time or supervisory time of PA/NP/Med student/Med Resident etc but could involve care discussion time    Dr. Kalman ShanMurali Neville Walston, M.D., [facility].C.C.P Pulmonary and Critical Care Medicine Staff Physician [place] [facility] [place] Pulmonary and Critical Care Pager: [phone], If no answer or between  15:00h - 7:00h: call [phone]  09-05-2016 4:59 PM

## 2016-08-28 NOTE — Progress Notes (Signed)
Death Note:  Patient became hypotensive, bradycardic, and then lost pulse and coded from 19:05 to 19:29. Family was called during that time and shortly after presented to the hospital/unit; daughter became extremely emotional, hostile, and said to stop. ET tube placement already established, vasopressors infusing, and ROSC achieved. Patient's family (husband and daughter) decided to not resuscitate her again and to make her comfortable. Morphine gtt was started, all vasopressor stopped, and waited on other family arrival prior to removing ET tube. Once all family present ET tube was removed. Patient lost pulse at 21:33. No heart sounds or lung sounds auscultated, and verified by Dollene Primroseourtney King, RN. Family remained at bedside until patient exired. No patient belongings found in room; [facility] called; ~240cc Morphine wasted and witnessed by Eddie NorthJennifer Clifton, RN; CCMD notified; body prepared and transported to morgue. Post-Mortem check sheet completed.

## 2016-08-28 NOTE — Procedures (Signed)
Extubation Procedure Note  Patient Details:   Name: Diane Cook DOB: 1956-08-05 MRN: [id number]   Airway Documentation:     Evaluation  O2 sats: transiently fell during during procedure Complications: Complications of GI bleed.  Patient did not tolerate procedure well. Bilateral Breath Sounds: Rhonchi, Coarse crackles, Diminished   No   Terminal extubation per family request.   Gaetano HawthorneBrooker, Dawanda Mapel 08/18/2016, 9:29 PM

## 2016-08-28 NOTE — Progress Notes (Signed)
Patient remains noncompliant with the bp cuff despite multiple explanations of its importance by nurse. Patient continues to remove the cuff and leads.

## 2016-08-28 NOTE — Procedures (Signed)
Arterial Catheter Insertion Procedure Note Emmit AlexandersJeane Oravec [id number] Jul 12, 1956  Procedure: Insertion of Arterial Catheter  Indications: Blood pressure monitoring  Procedure Details Consent: Unable to obtain consent because of emergent medical necessity.  An arterial catheter was inserted in the left radial artery using the Seldinger technique.  Evaluation Blood flow good; BP tracing good. Complications: No apparent complications.   Jamie KatoRIMBLE, Shantinique Picazo 08/25/2016

## 2016-08-28 NOTE — Procedures (Signed)
Intubation Procedure Note Emmit AlexandersJeane Lefferts [id number] [birth year]  Procedure: Intubation Indications: ETT needed to be changed  Procedure Details Consent: Unable to obtain consent because of emergent medical necessity.  After anesthesia left, the patient had major air leak around ETT. I re-evaluated the patient's airway with the video laryngoscope, and found the tube had migrated with the cuff above the cords. We inserted a bougie through the ETT, deflated the cuff, and inserted the tube with the cuff past the vocal cords. The cuff was re-inflated with good response.   Jamie KatoRIMBLE, Eustacio Ellen 07/31/2016

## 2016-08-28 NOTE — Anesthesia Postprocedure Evaluation (Signed)
Anesthesia Post Note  Patient: Diane AlexandersJeane Jachim  Procedure(s) Performed: * No procedures listed *  Patient location during evaluation: ICU Anesthesia Type: General (Code Blue. Pt required intubation) Level of consciousness: patient remains intubated per anesthesia plan Pain control: NA. Respiratory status: patient on ventilator - see flowsheet for VS    Last Vitals:  Vitals:   08-22-16 1450 08-22-16 1623  BP: (!) 107/51   Pulse: 97   Resp: (!) 25   Temp: 36.4 C 36.3 C    Last Pain:  Vitals:   08-22-16 1623  TempSrc: Oral  PainSc:                  Georgeanna Radziewicz

## 2016-08-28 NOTE — Progress Notes (Addendum)
PULMONARY / CRITICAL CARE MEDICINE   Name: Diane Cook MRN: [id number] DOB: February 07, 1956    ADMISSION DATE:  07/28/2016 CONSULTATION DATE:  08/05/2016  REFERRING MD:  Dalene Seltzer - EDP  CHIEF COMPLAINT:  Abd pain, dark stools  BRIEF SUMMARY:  Diane Cook is a 60 y.o. female with PMH  including known ETOH cirrhosis, chronic hep C.  She had recent admission 06/06/16 through 06/12/16 for GI bleed.  Had EGD 6/14 which revealed duodenitis, no varices or ulcers.  She was discharged and instructed to have outpatient follow up with GI. On 8/6, she presented again to [facility] ED with melena and hematemesis x 1 along with RLQ abd pain.  Symptoms had started 2 - 3 days earlier and persisted.  In ED, she had 1 episode of melena but no hematemesis or hematochezia.  Hgb was 5.6, she was started on PRBC transfusion.  In addition, she had AKI with hyperkalemia.  Admitted to [facility] for further evaluation.     STUDIES:  CXR 8/7 > mild edema. EGD 8/9 >> normal esophagus, no varices, mildly erythematous mucosa was found in the stomach. The examined duodenum was normal.  No signs of bleeding. Mucosa somewhat friable. Bleeding Scan 8/10 >> active bleeding in the small bowel or colon  CULTURES: None.  ANTIBIOTICS: Rocephin 8/7 >> 8/14   LINES/TUBES: LIJ 8/7 >>   SIGNIFICANT EVENTS: 8/07  Admit for GI bleed, presumed upper. 8/08  FFP  + 2 units prbc 8/09  No chest pain. Or dyspnea Dark stool . Afebrile. 1 unit prbc for 6.7hgb 8/10  hgb 6.7 hgb, tx 1 unit prbc. BRBPR. Levophed 4-6 mcg 8/11  Levo up to 28 mcg 08/07/16 -   Levophed increased to 28 mcg, bleeding slowed per RN.  Black / tarry stool (was bright red yesterday).  O2 increased to 6L, tachypneic last night.  Currently off O2   8//12/17 - s/ p 1PRBC and 1 platelet last night and 4 FFP  -> Flex Sig this AM - > full of blood -> repeat FFP and 1 Unit PRBC ordered/ 1 unit platelet  -> s/o angiogram without evidence of active bleed s/p IMA and SMA  injection.   SUBJECTIVE/OVERNIGHT/INTERVAL HX 09-01-16 - 2 U PRBC last meeting. INR worse. Creat at 5 and heading now into oliguria.  On vasopressin and levophed . Still actively bleeding.     VITAL SIGNS: BP (!) 115/44   Pulse 100   Temp 97.8 F (36.6 C) (Oral)   Resp (!) 2   Ht 5\' 11"  (1.803 m)   Wt (!) 174.2 kg (384 lb)   SpO2 98%   BMI 53.56 kg/m   HEMODYNAMICS: CVP:  [11 mmHg-18 mmHg] 11 mmHg  VENTILATOR SETTINGS:    INTAKE / OUTPUT: I/O last 3 completed shifts: In: 7555.7 [I.V.:2304.5; Blood:4201.2; Other:1000; IV Piggyback:50] Out: 1070 [Urine:1070]   PHYSICAL EXAMINATION: General: Obese female, in NAD.   Neuro: A&O x2 to place & name, non-focal. Moves all 4s, active in bed. Not agitated HEENT: [place]/AT. PERRL, sclerae anicteric.   Cardiovascular: RRR, 3/6 SEM.  Lungs: Respirations even and unlabored.  CTA bilaterally, No W/R/R. Abdomen: Obese, BS x 4, soft, tender RLQ. Musculoskeletal: No gross deformities, 1+ non pitting edema.  Skin: Intact, warm, no rashes.  LABS:  PULMONARY  Recent Labs Lab 08/20/2016 2305 08/03/16 0240  HCO3  --  14.3*  TCO2 18 13.7  O2SAT  --  97.4    CBC  Recent Labs Lab 08/17/2016 0926 08/13/2016 2155 01-Sep-2016 4098  HGB 6.4* 5.7* 6.9*  HCT 19.0* 16.7* 20.1*  WBC 15.0* 13.5* 14.4*  PLT 50* 58* 43*    COAGULATION  Recent Labs Lab 08/07/16 0730 08/27/2016 0926 08/10/2016 1340 08/21/2016 2155 08/12/2016 0629  INR 3.40 2.54 2.36 2.82 3.47    CARDIAC  Recent Labs Lab 08/03/16 0231 08/03/16 1758 08/03/16 2300 08/04/16 0545  TROPONINI 0.07* <0.03 0.05* 0.06*   No results for input(s): PROBNP in the last 168 hours.   CHEMISTRY  Recent Labs Lab 2016-08-13 0220  08/06/16 1545 08/07/16 0730 08/07/16 1711 08/01/2016 0512 08/01/2016 0629  NA 135  < > 138 141 137 137 135  K 4.1  < > 4.5 5.0 4.8 4.8 5.1  CL 100*  < > 102 103 101 97* 95*  CO2 26  < > GLUCOSE 120*  < > 100* 102* 103* 120* 108*  BUN  64*  < > 80* 90* 93* 92* 104*  CREATININE 3.19*  < > 3.23* 3.60* 3.94* 3.97* 5.01*  CALCIUM 8.9  < > 8.8* 8.8* 8.8* 9.1 9.0  MG 1.9  --   --  1.8  --  1.7 1.9  PHOS 5.1*  --   --  6.1*  --  6.4* 7.0*  < > = values in this interval not displayed. Estimated Creatinine Clearance: 20.6 mL/min (by C-G formula based on SCr of 5.01 mg/dL).  LIVER  Recent Labs Lab 07/30/2016 2253  August 13, 2016 0220 08/07/16 0730 08/07/16 1711 08/06/2016 0926 08/18/2016 1340 08/12/2016 2155 07/31/2016 0629  AST 250*  --  147* 111* 119*  --   --   --  152*  ALT 82*  --  58* 42 46  --   --   --  50  ALKPHOS 113  --  75 57 56  --   --   --  46  BILITOT 13.9*  --  16.0* 14.9* 15.3*  --   --   --  15.3*  PROT 6.7  --  6.8 4.6* 4.6*  --   --   --  4.2*  ALBUMIN 1.4*  --  2.0* 1.5* 1.5*  --   --   --  1.5*  INR 4.64*  < >  --  3.40  --  2.54 2.36 2.82 3.47  < > = values in this interval not displayed.  INFECTIOUS  Recent Labs Lab 08/06/16 1030 08/07/16 1711 08/12/2016 0015  LATICACIDVEN 3.2* 4.9* 3.7*    ENDOCRINE CBG (last 3)  No results for input(s): GLUCAP in the last 72 hours.   IMAGING x48h  - image(s) personally visualized  -   highlighted in bold Ir Angiogram Visceral Selective  Result Date: 08/10/2016 INDICATION: Gastrointestinal bleeding EXAM: SELECTIVE VISCERAL ARTERIOGRAPHY; IR RIGHT FLOURO GUIDE CV LINE; IR ULTRASOUND GUIDANCE VASC ACCESS RIGHT MEDICATIONS: None. The antibiotic was administered within 1 hour of the procedure ANESTHESIA/SEDATION: Moderate (conscious) sedation was employed during this procedure. A total of Versed 0 mg and Fentanyl 50 mcg was administered intravenously. Moderate Sedation Time: 60 minutes. The patient's level of consciousness and vital signs were monitored continuously by radiology nursing throughout the procedure under my direct supervision. CONTRAST:  150 cc Isovue 300 FLUOROSCOPY TIME:  Fluoroscopy Time: 10 minutes 54 seconds (32 mGy). COMPLICATIONS: None immediate.  PROCEDURE: Informed consent was obtained from the patient following explanation of the procedure, risks, benefits and alternatives. The patient understands, agrees and consents for the procedure. All questions were addressed. A time out was performed prior  to the initiation of the procedure. Maximal barrier sterile technique utilized including caps, mask, sterile gowns, sterile gloves, large sterile drape, hand hygiene, and Betadine prep. The right groin was prepped and draped in a sterile fashion. Under sonographic guidance, a micropuncture needle was advanced towards the right common femoral artery and removed over a 018 wire. The wire was noted to traverse into the venous system. A 20 cm 5 French PICC was then advanced over the wire into the central venous system, was flushed, then sewn in place. This will function as a venous catheter. A second attempt was made. Under sonographic guidance, a micropuncture needle was advanced into the right common femoral artery and removed over a 018 wire which was up sized to a Bentson. A 5 French sheath was inserted. Lateral abdominal aortography was performed The pigtail was exchanged for a cobra 2 catheter. This was advanced over a Bentson wire into the SMA and angiography was performed. The Cobra catheter was exchanged for a 5 [place] Sos Omni catheter. The IMA was selected in the angiography was performed in the left upper quadrant and over the pelvis. The Sos Omni catheter was advanced into the aorta, was straightened, then was retracted into the right external iliac artery. Right femoral angiography was performed. The catheter was removed. The sheath was then flushed and sewn in place. A saline pressure bag was instituted. The INR checked during the procedure was 2.3. FINDINGS: Initial imaging demonstrates placement of a right common femoral vein PICC line with its tip in the right common iliac vein Lateral abdominal aortography demonstrates patency of the celiac and SMA  Injection of the SMA demonstrates no contrast extravasation or source of active gastrointestinal bleeding. Injection of the IMA demonstrates no contrast extravasation or source of active gastrointestinal bleeding. Femoral angiography demonstrates sheath placement within the common femoral artery. IMPRESSION: Successful right common femoral vein PICC line. Successful mesenteric angiogram for gastrointestinal bleeding. The SMA and IMA were selected and injected with contrast. There was no source of active contrast extravasation to suggest gastrointestinal bleeding. Electronically Signed   By: Jolaine Click M.D.   On: 09/07/2016 14:50   Ir Angiogram Visceral Selective  Result Date: 09/07/16 INDICATION: Gastrointestinal bleeding EXAM: SELECTIVE VISCERAL ARTERIOGRAPHY; IR RIGHT FLOURO GUIDE CV LINE; IR ULTRASOUND GUIDANCE VASC ACCESS RIGHT MEDICATIONS: None. The antibiotic was administered within 1 hour of the procedure ANESTHESIA/SEDATION: Moderate (conscious) sedation was employed during this procedure. A total of Versed 0 mg and Fentanyl 50 mcg was administered intravenously. Moderate Sedation Time: 60 minutes. The patient's level of consciousness and vital signs were monitored continuously by radiology nursing throughout the procedure under my direct supervision. CONTRAST:  150 cc Isovue 300 FLUOROSCOPY TIME:  Fluoroscopy Time: 10 minutes 54 seconds (32 mGy). COMPLICATIONS: None immediate. PROCEDURE: Informed consent was obtained from the patient following explanation of the procedure, risks, benefits and alternatives. The patient understands, agrees and consents for the procedure. All questions were addressed. A time out was performed prior to the initiation of the procedure. Maximal barrier sterile technique utilized including caps, mask, sterile gowns, sterile gloves, large sterile drape, hand hygiene, and Betadine prep. The right groin was prepped and draped in a sterile fashion. Under sonographic guidance,  a micropuncture needle was advanced towards the right common femoral artery and removed over a 018 wire. The wire was noted to traverse into the venous system. A 20 cm 5 French PICC was then advanced over the wire into the central venous system, was flushed, then sewn  in place. This will function as a venous catheter. A second attempt was made. Under sonographic guidance, a micropuncture needle was advanced into the right common femoral artery and removed over a 018 wire which was up sized to a Bentson. A 5 French sheath was inserted. Lateral abdominal aortography was performed The pigtail was exchanged for a cobra 2 catheter. This was advanced over a Bentson wire into the SMA and angiography was performed. The Cobra catheter was exchanged for a 5 [place] Sos Omni catheter. The IMA was selected in the angiography was performed in the left upper quadrant and over the pelvis. The Sos Omni catheter was advanced into the aorta, was straightened, then was retracted into the right external iliac artery. Right femoral angiography was performed. The catheter was removed. The sheath was then flushed and sewn in place. A saline pressure bag was instituted. The INR checked during the procedure was 2.3. FINDINGS: Initial imaging demonstrates placement of a right common femoral vein PICC line with its tip in the right common iliac vein Lateral abdominal aortography demonstrates patency of the celiac and SMA Injection of the SMA demonstrates no contrast extravasation or source of active gastrointestinal bleeding. Injection of the IMA demonstrates no contrast extravasation or source of active gastrointestinal bleeding. Femoral angiography demonstrates sheath placement within the common femoral artery. IMPRESSION: Successful right common femoral vein PICC line. Successful mesenteric angiogram for gastrointestinal bleeding. The SMA and IMA were selected and injected with contrast. There was no source of active contrast extravasation  to suggest gastrointestinal bleeding. Electronically Signed   By: Jolaine Click M.D.   On: 2016/09/07 14:50   Ir Fluoro Guide Cv Line Right  Result Date: 09/07/16 INDICATION: Gastrointestinal bleeding EXAM: SELECTIVE VISCERAL ARTERIOGRAPHY; IR RIGHT FLOURO GUIDE CV LINE; IR ULTRASOUND GUIDANCE VASC ACCESS RIGHT MEDICATIONS: None. The antibiotic was administered within 1 hour of the procedure ANESTHESIA/SEDATION: Moderate (conscious) sedation was employed during this procedure. A total of Versed 0 mg and Fentanyl 50 mcg was administered intravenously. Moderate Sedation Time: 60 minutes. The patient's level of consciousness and vital signs were monitored continuously by radiology nursing throughout the procedure under my direct supervision. CONTRAST:  150 cc Isovue 300 FLUOROSCOPY TIME:  Fluoroscopy Time: 10 minutes 54 seconds (32 mGy). COMPLICATIONS: None immediate. PROCEDURE: Informed consent was obtained from the patient following explanation of the procedure, risks, benefits and alternatives. The patient understands, agrees and consents for the procedure. All questions were addressed. A time out was performed prior to the initiation of the procedure. Maximal barrier sterile technique utilized including caps, mask, sterile gowns, sterile gloves, large sterile drape, hand hygiene, and Betadine prep. The right groin was prepped and draped in a sterile fashion. Under sonographic guidance, a micropuncture needle was advanced towards the right common femoral artery and removed over a 018 wire. The wire was noted to traverse into the venous system. A 20 cm 5 French PICC was then advanced over the wire into the central venous system, was flushed, then sewn in place. This will function as a venous catheter. A second attempt was made. Under sonographic guidance, a micropuncture needle was advanced into the right common femoral artery and removed over a 018 wire which was up sized to a Bentson. A 5 French sheath was  inserted. Lateral abdominal aortography was performed The pigtail was exchanged for a cobra 2 catheter. This was advanced over a Bentson wire into the SMA and angiography was performed. The Cobra catheter was exchanged for a 5 [place] S[employer]  catheter. The IMA was selected in the angiography was performed in the left upper quadrant and over the pelvis. The Sos Omni catheter was advanced into the aorta, was straightened, then was retracted into the right external iliac artery. Right femoral angiography was performed. The catheter was removed. The sheath was then flushed and sewn in place. A saline pressure bag was instituted. The INR checked during the procedure was 2.3. FINDINGS: Initial imaging demonstrates placement of a right common femoral vein PICC line with its tip in the right common iliac vein Lateral abdominal aortography demonstrates patency of the celiac and SMA Injection of the SMA demonstrates no contrast extravasation or source of active gastrointestinal bleeding. Injection of the IMA demonstrates no contrast extravasation or source of active gastrointestinal bleeding. Femoral angiography demonstrates sheath placement within the common femoral artery. IMPRESSION: Successful right common femoral vein PICC line. Successful mesenteric angiogram for gastrointestinal bleeding. The SMA and IMA were selected and injected with contrast. There was no source of active contrast extravasation to suggest gastrointestinal bleeding. Electronically Signed   By: Jolaine Click M.D.   On: Aug 22, 2016 14:50   Ir US Guide Vasc Access Right  Result Date: 2016-08-22 INDICATION: Gastrointestinal bleeding EXAM: SELECTIVE VISCERAL ARTERIOGRAPHY; IR RIGHT FLOURO GUIDE CV LINE; IR ULTRASOUND GUIDANCE VASC ACCESS RIGHT MEDICATIONS: None. The antibiotic was administered within 1 hour of the procedure ANESTHESIA/SEDATION: Moderate (conscious) sedation was employed during this procedure. A total of Versed 0 mg and Fentanyl 50 mcg  was administered intravenously. Moderate Sedation Time: 60 minutes. The patient's level of consciousness and vital signs were monitored continuously by radiology nursing throughout the procedure under my direct supervision. CONTRAST:  150 cc Isovue 300 FLUOROSCOPY TIME:  Fluoroscopy Time: 10 minutes 54 seconds (32 mGy). COMPLICATIONS: None immediate. PROCEDURE: Informed consent was obtained from the patient following explanation of the procedure, risks, benefits and alternatives. The patient understands, agrees and consents for the procedure. All questions were addressed. A time out was performed prior to the initiation of the procedure. Maximal barrier sterile technique utilized including caps, mask, sterile gowns, sterile gloves, large sterile drape, hand hygiene, and Betadine prep. The right groin was prepped and draped in a sterile fashion. Under sonographic guidance, a micropuncture needle was advanced towards the right common femoral artery and removed over a 018 wire. The wire was noted to traverse into the venous system. A 20 cm 5 French PICC was then advanced over the wire into the central venous system, was flushed, then sewn in place. This will function as a venous catheter. A second attempt was made. Under sonographic guidance, a micropuncture needle was advanced into the right common femoral artery and removed over a 018 wire which was up sized to a Bentson. A 5 French sheath was inserted. Lateral abdominal aortography was performed The pigtail was exchanged for a cobra 2 catheter. This was advanced over a Bentson wire into the SMA and angiography was performed. The Cobra catheter was exchanged for a 5 [place] Sos Omni catheter. The IMA was selected in the angiography was performed in the left upper quadrant and over the pelvis. The Sos Omni catheter was advanced into the aorta, was straightened, then was retracted into the right external iliac artery. Right femoral angiography was performed. The  catheter was removed. The sheath was then flushed and sewn in place. A saline pressure bag was instituted. The INR checked during the procedure was 2.3. FINDINGS: Initial imaging demonstrates placement of a right common femoral vein PICC line with its  tip in the right common iliac vein Lateral abdominal aortography demonstrates patency of the celiac and SMA Injection of the SMA demonstrates no contrast extravasation or source of active gastrointestinal bleeding. Injection of the IMA demonstrates no contrast extravasation or source of active gastrointestinal bleeding. Femoral angiography demonstrates sheath placement within the common femoral artery. IMPRESSION: Successful right common femoral vein PICC line. Successful mesenteric angiogram for gastrointestinal bleeding. The SMA and IMA were selected and injected with contrast. There was no source of active contrast extravasation to suggest gastrointestinal bleeding. Electronically Signed   By: Jolaine Click M.D.   On: 08-11-16 14:50   Ir US Guide Vasc Access Right  Result Date: 2016/08/11 INDICATION: Gastrointestinal bleeding EXAM: SELECTIVE VISCERAL ARTERIOGRAPHY; IR RIGHT FLOURO GUIDE CV LINE; IR ULTRASOUND GUIDANCE VASC ACCESS RIGHT MEDICATIONS: None. The antibiotic was administered within 1 hour of the procedure ANESTHESIA/SEDATION: Moderate (conscious) sedation was employed during this procedure. A total of Versed 0 mg and Fentanyl 50 mcg was administered intravenously. Moderate Sedation Time: 60 minutes. The patient's level of consciousness and vital signs were monitored continuously by radiology nursing throughout the procedure under my direct supervision. CONTRAST:  150 cc Isovue 300 FLUOROSCOPY TIME:  Fluoroscopy Time: 10 minutes 54 seconds (32 mGy). COMPLICATIONS: None immediate. PROCEDURE: Informed consent was obtained from the patient following explanation of the procedure, risks, benefits and alternatives. The patient understands, agrees and  consents for the procedure. All questions were addressed. A time out was performed prior to the initiation of the procedure. Maximal barrier sterile technique utilized including caps, mask, sterile gowns, sterile gloves, large sterile drape, hand hygiene, and Betadine prep. The right groin was prepped and draped in a sterile fashion. Under sonographic guidance, a micropuncture needle was advanced towards the right common femoral artery and removed over a 018 wire. The wire was noted to traverse into the venous system. A 20 cm 5 French PICC was then advanced over the wire into the central venous system, was flushed, then sewn in place. This will function as a venous catheter. A second attempt was made. Under sonographic guidance, a micropuncture needle was advanced into the right common femoral artery and removed over a 018 wire which was up sized to a Bentson. A 5 French sheath was inserted. Lateral abdominal aortography was performed The pigtail was exchanged for a cobra 2 catheter. This was advanced over a Bentson wire into the SMA and angiography was performed. The Cobra catheter was exchanged for a 5 [place] Sos Omni catheter. The IMA was selected in the angiography was performed in the left upper quadrant and over the pelvis. The Sos Omni catheter was advanced into the aorta, was straightened, then was retracted into the right external iliac artery. Right femoral angiography was performed. The catheter was removed. The sheath was then flushed and sewn in place. A saline pressure bag was instituted. The INR checked during the procedure was 2.3. FINDINGS: Initial imaging demonstrates placement of a right common femoral vein PICC line with its tip in the right common iliac vein Lateral abdominal aortography demonstrates patency of the celiac and SMA Injection of the SMA demonstrates no contrast extravasation or source of active gastrointestinal bleeding. Injection of the IMA demonstrates no contrast extravasation  or source of active gastrointestinal bleeding. Femoral angiography demonstrates sheath placement within the common femoral artery. IMPRESSION: Successful right common femoral vein PICC line. Successful mesenteric angiogram for gastrointestinal bleeding. The SMA and IMA were selected and injected with contrast. There was no source of active contrast  extravasation to suggest gastrointestinal bleeding. Electronically Signed   By: Jolaine ClickArthur  Hoss M.D.   On: 2016-06-23 14:50   ASSESSMENT / PLAN:  DISCUSSION:  60 y/o F with hx ETOH cirrhosis and hep c, had recent UGIB in June 2017 due to duodenitis.  Admitted again 8/7 with GI bleed, presumed upper again.  Later developed BRBPR.  Tag study concerning for bleeding in the small bowel or colon.    GASTROINTESTINAL A:   Hx ETOH cirrhosis, HCV.  But No varices  This admit:   - Active GIB and s/p multiple blood prodcuts UGIB -   -   Admission June 2017 where EGD revealed duodenitis. Repeat endoscopy 08/04/16 - faily normal UGI.    - Nuc med scan - ?  LLQ lower gi bleeed 08/06/16   - flex sig 08/07/16 - active bleeding   - IR angio 2016-02-15 - s/p SMA and IMA injection (no active bleedig noted)  08/26/2016 - ongoing bleeding LGI + /. Per G I - colonoscopy not a viable option due to lack of visual  P:   NPO PPI Call [facility] consult    HEMATOLOGIC A:   AoC anemia - exacerbated due to acute UGIB. S/p multiple units PRBC's Chronic thrombocytopenia - likely due to bone marrow suppression from ETOH. Coagulopathy - likely due to liver dysfunction.   - ongoing multiple transfusion 08/21/2016  P:  Trend CBC ; q6h Ttransfusion to maintain Hgb > 7 or for shock SCD's. Folic acid, thiamine  Continue empiric rocephin for GI prophylaxis   RENAL A ATN  - due to gi bleed, shock and piossibly due from angio 2016-02-15   - progressive since admit ; P:   Sodium bicarbonate at 25 ml/hr Trend BMP / UOP  Replace electrolytes as indicated Might need renal consult but can be  of help only if we can stop bleeding  CARDIOVASCULAR A:  Hemorrhagic shock  Rule out Adrenal Insufficiency    - on levophed and vasopression P:  Continue levophed and vaso for goal MAP >65  May need ALINE placement due to difficulty with BP readings and increasing vasopressor requirements  PULMONARY A: Hx asthma. Probable OSA / OHS. P:   Pulmonary hygiene. CPAP Qhs  INFECTIOUS A:   No indication of infection.  empirc rocephin 07/31/2016 - 08/10/16 P:   Monitor clinically. Empiric rocephin for GI prophylaxis     ENDOCRINE A:   No acute issues.   P:   No interventions required.  NEUROLOGIC A:   Hepatic encephalopathy Hx ETOH abuse  -very mild confusion only P:   Thiamine / folate. ETOH cessation counseling. Lactulose -per GI  Family updated:  Daughter updated at bedside 8/11 , 2016-02-15  Interdisciplinary Family Meeting v Palliative Care Meeting:  FULL CODE as of 08/07/16 and 2016-02-15 (done after rounds). PCCM to update daughter again 08/10/2016 - she is declining and If CCS cannot help control bleed - > ? Comfort care         The patient is critically ill with multiple organ systems failure and requires high complexity decision making for assessment and support, frequent evaluation and titration of therapies, application of advanced monitoring technologies and extensive interpretation of multiple databases.   Critical Care Time devoted to patient care services described in this note is  45  Minutes. This time reflects time of care of this signee Dr Kalman ShanMurali Cale Bethard. This critical care time does not reflect procedure time, or teaching time or supervisory time of PA/NP/Med student/Med Resident etc  but could involve care discussion time    Dr. Kalman Shan, M.D., [facility].C.P Pulmonary and Critical Care Medicine Staff Physician [facility] [place] [facility]monary and Critical [facility] Pager: [phone], If no answer or between  15:00h - 7:00h: call [phone]  08/13/2016 9:14 AM

## 2016-08-28 NOTE — Significant Event (Signed)
Spent extensive time with Husband, Daughter and patient addressing code status.  Explained that she continues to have GIB despite all efforts to control her bleed. Invasive procedures are limited due to her elevated INR that has proven refractory to treatment.  We(Dr. Marchelle Gearingamaswamy and S. Briceyda Abdullah ACNP) have put forth a plan of action. 1. Factor v11a infusion in an attempt to control bleeding and reverse coagulopathic state.  2. Change code status to No CPR, No intubation, No shock. If we can't control her GIB then CPR/Intubation would be futile.  Then daughter is considering these options and expresses understanding. We will wait for her and husbands decision. BP (!) 107/51   Pulse 97   Temp 97.4 F (36.3 C) (Oral)   Resp (!) 25   Ht 5\' 11"  (1.803 m)   Wt (!) 384 lb (174.2 kg)   SpO2 98%   BMI 53.56 kg/m  CBC    Component Value Date/Time   WBC 16.9 (H) Oct 13, 2016 1625   RBC 1.74 (L) Oct 13, 2016 1625   HGB 5.6 (LL) Oct 13, 2016 1625   HCT 16.4 (L) Oct 13, 2016 1625   PLT 31 (L) Oct 13, 2016 1625   MCV 94.3 Oct 13, 2016 1625   MCH 32.2 Oct 13, 2016 1625   MCHC 34.1 Oct 13, 2016 1625   RDW 16.4 (H) Oct 13, 2016 1625   LYMPHSABS PENDING Oct 13, 2016 1625   MONOABS PENDING Oct 13, 2016 1625   EOSABS PENDING Oct 13, 2016 1625   BASOSABS PENDING Oct 13, 2016 1625   Lab Results  Component Value Date   INR 3.32 Oct 13, 2016   INR 3.47 Oct 13, 2016   INR 2.82 08/13/2016    Brett CanalesSteve Eddy Liszewski ACNP

## 2016-08-28 NOTE — Anesthesia Procedure Notes (Signed)
Procedure Name: Intubation Date/Time: 08/04/2016 7:28 PM Performed by: Brien MatesMAHONY, Shawndale Kilpatrick D Pre-anesthesia Checklist: Patient identified, Emergency Drugs available, Suction available, Patient being monitored and Timeout performed Patient Re-evaluated:Patient Re-evaluated prior to inductionOxygen Delivery Method: Circle system utilized Preoxygenation: Pre-oxygenation with 100% oxygen Laryngoscope Size: Glidescope Grade View: Grade I Tube type: Subglottic suction tube Tube size: 7.5 mm Number of attempts: 1 Airway Equipment and Method: Stylet and Video-laryngoscopy Placement Confirmation: ETT inserted through vocal cords under direct vision,  CO2 detector and breath sounds checked- equal and bilateral Secured at: 22 cm Tube secured with: Tape Dental Injury: Teeth and Oropharynx as per pre-operative assessment

## 2016-08-28 NOTE — Code Documentation (Cosign Needed)
CODE BLUE NOTE  Patient Name: Diane Cook   MRN: [id number]   Date of Birth/ Sex: 1956/09/05 , female      Admission Date: 08/11/2016  Attending Provider: Leslye Peerobert S Byrum, MD  Primary Diagnosis: Hemorrhagic shock [R57.9] Hyperkalemia [E87.5] Lactic acidosis [E87.2] Upper GI bleed [K92.2] AKI (acute kidney injury) (HCC) [N17.9]    Indication: Pt was in her usual state of health until this PM, when she was noted to be unresponsive. Code blue was subsequently called. At the time of arrival on scene, ACLS protocol was underway.   Technical Description:  - CPR performance duration:  15 minutes  - Was defibrillation or cardioversion used? No   - Was external pacer placed? No  - Was patient intubated pre/post CPR? Yes    Medications Administered: Y = Yes; Blank = No Amiodarone    Atropine    Calcium  y  Epinephrine  y  Lidocaine    Magnesium    Norepinephrine    Phenylephrine    Sodium bicarbonate  y  Vasopressin    Other     Post CPR evaluation:  - Final Status - Was patient successfully resuscitated ? No   Miscellaneous Information:  - Time of death:  7:20 PM  - Primary team notified?  Yes  - Family Notified? Yes        Freddrick MarchYashika Zackarey Holleman, MD   08/27/2016, 10:23 PM

## 2016-08-28 NOTE — Discharge Summary (Signed)
DISCHARGE SUMMARY    Date of admit: 08/10/2016  9:51 PM Date of discharge: 01-22-16 11:55 PM Length of Stay: 6 days  PCP is Lora PaulaFUNCHES, JOSALYN C, MD   PROBLEM LIST Active Problems:   GI bleed   Hemorrhagic shock   Encounter for central line placement   Acute lower GI bleeding   Acute renal failure (ARF) (HCC)    SUMMARY Diane Cook was 60 y.o. patient with    has a past medical history of Alcoholism (HCC); Arthritis (Dx 2014); Asthma (Dx 2001); Bipolar disorder (HCC) ([id number]); Family history of adverse reaction to anesthesia; H/O non anemic vitamin B12 deficiency; Hepatitis C (Dx 2011); Hypertension (Dx 2004); and Stroke [facility](HCC) (Dx 1997).   has a past surgical history that includes Abdominal hysterectomy; Cholecystectomy; Esophagogastroduodenoscopy (egd) with propofol (N/A, 06/10/2016); Esophagogastroduodenoscopy (N/A, 08/23/2016); ir generic historical (08/16/2016); ir generic historical (08/12/2016); ir generic historical (08/03/2016); ir generic historical (08/25/2016); ir generic historical (08/21/2016); and Flexible sigmoidoscopy (N/A, 08/14/2016).   Admitted on 08/08/2016 withJ PMH  including known ETOH cirrhosis, chronic hep C.  She had recent admission 06/06/16 through 06/12/16 for GI bleed.  Had EGD 6/14 which revealed duodenitis, no varices or ulcers.  She was discharged and instructed to have outpatient follow up with GI. On 8/6, [facility] with melena and hematemesis x 1 along with RLQ abd pain.  Symptoms had started 2 - 3 days earlier and persisted.  In ED, she had 1 episode of melena but no hematemesis or hematochezia.  Hgb was 5.6, she was started on PRBC transfusion.  In addition, she had AKI with hyperkalemia.  [facility]     EVENTS /07  Admit for GI bleed, presumed upper. 8/08  FFP  + 2 units prbc 8/09  No chest pain. Or dyspnea Dark stool . Afebrile. 1 unit prbc for 6.7hgb 8/10  hgb 6.7 hgb, tx 1 unit prbc. BRBPR. Levophed  4-6 mcg.. Nuclear med scan showing possible lower gi bleed 8/11  Levo up to 28 mcg 08/07/16 -   Levophed increased to 28 mcg, bleeding slowed per RN.  Black / tarry stool (was bright red yesterday).  O2 increased to 6L, tachypneic last night.  Currently off O2   8//12/17 - s/ p 1PRBC and 1 platelet last night and 4 FFP  -> Flex Sig this AM - > full of blood -> repeat FFP and 1 Unit PRBC ordered/ 1 unit platelet  -> s/p angiogram without evidence of active bleed s/p IMA and SMA injection.  19-Dec-2016 - 2 U PRBC last meeting. INR worse. Creat at 5 and heading now into oliguria.  On vasopressin and levophed 37mcg. Still actively bleeding.  Patient was now having ongoing severe LGI bleed despite above interventions and multiple transfusions. Up until this point, daughter always expressed full code knowing fully well high mortality situation. Multiple conversations were had with GI MD on this day and impression was that colonoscopy would not be a viable option. The GI attending had this conversation with daughter in a witnessed conversation and explained high risk for patient death due to hemorrhagic shock. Surgical Services  Dr Dwain SarnaWakefield also evaluated patient and felt patient was high risk surgical candidate esp due to liver disease, lack of localization of bleed and coagulopathy.IR services was recalled and they too did not have any procedure to offer. So repeat conversation held with daughter. Grave prognosis explained by APP Brett CanalesSteve Minor and subsequently by both this MD and APP :  daughter could not accept a DNR status. Despite giving her some time to think about it she really could not make up her mind about DNR status. Factor 7A was tried as a last ditch effort to save patient life after informing daughter. However, patient continued to bleed and suffered cardiac arrest and expired 08/14/2016    SIGNED Dr. Kalman ShanMurali Ladina Shutters, M.D., [facility].C.P Pulmonary and Critical Care Medicine Staff Physician [place]  [facility] [place] Pul[facility] and Critical Care Pager: [phone], If no answer or between  15:00h - 7:00h: call [phone]  08/11/2016 1:33 PM

## 2016-08-28 NOTE — Progress Notes (Signed)
S: Responded to code blue, with resuscitation underway. Pt intubated, although later need to be re-intubated due to repositioning of the ETT. Unclear inciting event. Family (POA) returned to bedside and was very loud and disruptive, with smell of EtOH on breath. High emotion with uncontrollable crying and changing from angry outbursts to tearful apologies for behavior. Pt continued to have high output of blood out of rectum. No curative strategy was apparent.  O: Difficulty finding pulses due to habitus. Code was paused as family called to cease interventions, but pt was found to be spontaneously breathing. An a-line was placed and confirmed strong pulse.  A/P: Family returned and discussed plan of care with appropriate display of judgement. Family requested comfort measures only, which we believed an appropriate plan of action, and we implemented.   CRITICAL CARE Performed by: Jamie KatoRIMBLE, Renly Guedes  Total critical care time: 85 minutes  Critical care time was exclusive of separately billable procedures and treating other patients.  Critical care was necessary to treat or prevent imminent or life-threatening deterioration.  Critical care was time spent personally by me on the following activities: development of treatment plan with patient and/or surrogate as well as nursing, discussions with consultants, evaluation of patient's response to treatment, examination of patient, obtaining history from patient or surrogate, ordering and performing treatments and interventions, ordering and review of laboratory studies, ordering and review of radiographic studies, pulse oximetry and re-evaluation of patient's condition.  Jamie KatoAaron Kiosha Buchan, MD Pulmonary & Critical Care Medicine August 09, 2016, 10:50 PM

## 2016-08-28 NOTE — Progress Notes (Signed)
Patient ID: Diane Cook, female   DOB: 18-Jul-1956, 60 y.o.   MRN: [id number]    Referring Physician(s): * No referring provider recorded for this case *  Supervising Physician: Jolaine ClickHoss, Arthur  Patient Status: inpt  Chief Complaint: GI bleed  Subjective: Patient continues to have multiple bloody bowel movements. She does not complain of any pain currently  Allergies: Review of patient's allergies indicates no known allergies.  Medications: Prior to Admission medications   Medication Sig Start Date End Date Taking? Authorizing Provider  albuterol (PROVENTIL HFA;VENTOLIN HFA) 108 (90 Base) MCG/ACT inhaler Inhale 2 puffs into the lungs every 6 (six) hours as needed for wheezing or shortness of breath. 07/17/16  Yes Josalyn Funches, MD  ammonium lactate (LAC-HYDRIN) 12 % cream Apply topically as needed for dry skin. 02/13/16  Yes Josalyn Funches, MD  fluticasone (FLONASE) 50 MCG/ACT nasal spray Place 2 sprays into both nostrils daily. Patient taking differently: Place 2 sprays into both nostrils daily as needed for allergies.  11/18/13  Yes Robyn M Hess, PA-C  folic acid (FOLVITE) 1 MG tablet Take 1 tablet (1 mg total) by mouth daily. 07/17/16  Yes Josalyn Funches, MD  furosemide (LASIX) 40 MG tablet Take 1 tablet (40 mg total) by mouth daily. 07/17/16  Yes Josalyn Funches, MD  lactulose (CHRONULAC) 10 GM/15ML solution Take 30 mLs (20 g total) by mouth 2 (two) times daily. 07/17/16  Yes Josalyn Funches, MD  Multiple Vitamins-Minerals (MULTIVITAMIN) tablet Take 1 tablet by mouth daily. 07/17/16  Yes Josalyn Funches, MD  pantoprazole (PROTONIX) 40 MG tablet Take 1 tablet (40 mg total) by mouth daily before supper. 07/21/16  Yes Josalyn Funches, MD  potassium chloride (K-DUR,KLOR-CON) 10 MEQ tablet Take 10 mEq by mouth daily.   Yes Historical Provider, MD  spironolactone (ALDACTONE) 50 MG tablet Take 1 tablet (50 mg total) by mouth daily. 07/17/16  Yes Josalyn Funches, MD  thiamine 100 MG tablet Take  1 tablet (100 mg total) by mouth daily. 07/17/16  Yes Josalyn Funches, MD    Vital Signs: BP (!) 115/44   Pulse 100   Temp 97.6 F (36.4 C) (Oral)   Resp (!) 2   Ht 5\' 11"  (1.803 m)   Wt (!) 384 lb (174.2 kg)   SpO2 98%   BMI 53.56 kg/m   Physical Exam: Abd: right inguinal site is difficult to assess due to body habitus.  It does have some oozing present, but is otherwise stable and in good position.  Imaging: Nm Gi Blood Loss  Result Date: 08/06/2016 CLINICAL DATA:  Evaluate for GI bleed. EXAM: NUCLEAR MEDICINE GASTROINTESTINAL BLEEDING SCAN TECHNIQUE: Sequential abdominal images were obtained following intravenous administration of Tc-1122m labeled red blood cells. RADIOPHARMACEUTICALS:  25.8 mCi Tc-4922m in-vitro labeled red cells. COMPARISON:  None. FINDINGS: Markedly diminished exam detail secondary to excessive motion artifact. Patient was not able to remain still during the examination. Within this limitation there is evidence to suggest intraluminal radiotracer accumulation within the bowel loops. Compatible with the clinical history of GI bleed. On the first hour the radiotracer appears to accumulate in moved in the distribution of the left lower quadrant of the abdomen. On the second hour radiotracer can be seen crossing the midline from left to right. IMPRESSION: 1. Markedly diminished exam detail secondary to excessive motion artifact. 2. Based on the images acquired there is evidence of intraluminal bowel accumulation which may reflect a small or large bowel bleed. I cannot confidently say whether the activity is originating from small  bowel or large bowel loops or within the right side of abdomen or left abdomen. Electronically Signed   By: Signa Kell M.D.   On: 08/06/2016 15:01   Ir Angiogram Visceral Selective  Result Date: 08/02/2016 INDICATION: Gastrointestinal bleeding EXAM: SELECTIVE VISCERAL ARTERIOGRAPHY; IR RIGHT FLOURO GUIDE CV LINE; IR ULTRASOUND GUIDANCE VASC ACCESS  RIGHT MEDICATIONS: None. The antibiotic was administered within 1 hour of the procedure ANESTHESIA/SEDATION: Moderate (conscious) sedation was employed during this procedure. A total of Versed 0 mg and Fentanyl 50 mcg was administered intravenously. Moderate Sedation Time: 60 minutes. The patient's level of consciousness and vital signs were monitored continuously by radiology nursing throughout the procedure under my direct supervision. CONTRAST:  150 cc Isovue 300 FLUOROSCOPY TIME:  Fluoroscopy Time: 10 minutes 54 seconds (32 mGy). COMPLICATIONS: None immediate. PROCEDURE: Informed consent was obtained from the patient following explanation of the procedure, risks, benefits and alternatives. The patient understands, agrees and consents for the procedure. All questions were addressed. A time out was performed prior to the initiation of the procedure. Maximal barrier sterile technique utilized including caps, mask, sterile gowns, sterile gloves, large sterile drape, hand hygiene, and Betadine prep. The right groin was prepped and draped in a sterile fashion. Under sonographic guidance, a micropuncture needle was advanced towards the right common femoral artery and removed over a 018 wire. The wire was noted to traverse into the venous system. A 20 cm 5 French PICC was then advanced over the wire into the central venous system, was flushed, then sewn in place. This will function as a venous catheter. A second attempt was made. Under sonographic guidance, a micropuncture needle was advanced into the right common femoral artery and removed over a 018 wire which was up sized to a Bentson. A 5 French sheath was inserted. Lateral abdominal aortography was performed The pigtail was exchanged for a cobra 2 catheter. This was advanced over a Bentson wire into the SMA and angiography was performed. The Cobra catheter was exchanged for a 5 [place] Sos Omni catheter. The IMA was selected in the angiography was performed in the  left upper quadrant and over the pelvis. The Sos Omni catheter was advanced into the aorta, was straightened, then was retracted into the right external iliac artery. Right femoral angiography was performed. The catheter was removed. The sheath was then flushed and sewn in place. A saline pressure bag was instituted. The INR checked during the procedure was 2.3. FINDINGS: Initial imaging demonstrates placement of a right common femoral vein PICC line with its tip in the right common iliac vein Lateral abdominal aortography demonstrates patency of the celiac and SMA Injection of the SMA demonstrates no contrast extravasation or source of active gastrointestinal bleeding. Injection of the IMA demonstrates no contrast extravasation or source of active gastrointestinal bleeding. Femoral angiography demonstrates sheath placement within the common femoral artery. IMPRESSION: Successful right common femoral vein PICC line. Successful mesenteric angiogram for gastrointestinal bleeding. The SMA and IMA were selected and injected with contrast. There was no source of active contrast extravasation to suggest gastrointestinal bleeding. Electronically Signed   By: Jolaine Click M.D.   On: 08/05/2016 14:50   Ir Angiogram Visceral Selective  Result Date: 08/07/2016 INDICATION: Gastrointestinal bleeding EXAM: SELECTIVE VISCERAL ARTERIOGRAPHY; IR RIGHT FLOURO GUIDE CV LINE; IR ULTRASOUND GUIDANCE VASC ACCESS RIGHT MEDICATIONS: None. The antibiotic was administered within 1 hour of the procedure ANESTHESIA/SEDATION: Moderate (conscious) sedation was employed during this procedure. A total of Versed 0 mg and Fentanyl 50  mcg was administered intravenously. Moderate Sedation Time: 60 minutes. The patient's level of consciousness and vital signs were monitored continuously by radiology nursing throughout the procedure under my direct supervision. CONTRAST:  150 cc Isovue 300 FLUOROSCOPY TIME:  Fluoroscopy Time: 10 minutes 54 seconds  (32 mGy). COMPLICATIONS: None immediate. PROCEDURE: Informed consent was obtained from the patient following explanation of the procedure, risks, benefits and alternatives. The patient understands, agrees and consents for the procedure. All questions were addressed. A time out was performed prior to the initiation of the procedure. Maximal barrier sterile technique utilized including caps, mask, sterile gowns, sterile gloves, large sterile drape, hand hygiene, and Betadine prep. The right groin was prepped and draped in a sterile fashion. Under sonographic guidance, a micropuncture needle was advanced towards the right common femoral artery and removed over a 018 wire. The wire was noted to traverse into the venous system. A 20 cm 5 French PICC was then advanced over the wire into the central venous system, was flushed, then sewn in place. This will function as a venous catheter. A second attempt was made. Under sonographic guidance, a micropuncture needle was advanced into the right common femoral artery and removed over a 018 wire which was up sized to a Bentson. A 5 French sheath was inserted. Lateral abdominal aortography was performed The pigtail was exchanged for a cobra 2 catheter. This was advanced over a Bentson wire into the SMA and angiography was performed. The Cobra catheter was exchanged for a 5 [place] Sos Omni catheter. The IMA was selected in the angiography was performed in the left upper quadrant and over the pelvis. The Sos Omni catheter was advanced into the aorta, was straightened, then was retracted into the right external iliac artery. Right femoral angiography was performed. The catheter was removed. The sheath was then flushed and sewn in place. A saline pressure bag was instituted. The INR checked during the procedure was 2.3. FINDINGS: Initial imaging demonstrates placement of a right common femoral vein PICC line with its tip in the right common iliac vein Lateral abdominal aortography  demonstrates patency of the celiac and SMA Injection of the SMA demonstrates no contrast extravasation or source of active gastrointestinal bleeding. Injection of the IMA demonstrates no contrast extravasation or source of active gastrointestinal bleeding. Femoral angiography demonstrates sheath placement within the common femoral artery. IMPRESSION: Successful right common femoral vein PICC line. Successful mesenteric angiogram for gastrointestinal bleeding. The SMA and IMA were selected and injected with contrast. There was no source of active contrast extravasation to suggest gastrointestinal bleeding. Electronically Signed   By: Jolaine Click M.D.   On: 07/31/2016 14:50   Ir Fluoro Guide Cv Line Right  Result Date: 07/31/2016 INDICATION: Gastrointestinal bleeding EXAM: SELECTIVE VISCERAL ARTERIOGRAPHY; IR RIGHT FLOURO GUIDE CV LINE; IR ULTRASOUND GUIDANCE VASC ACCESS RIGHT MEDICATIONS: None. The antibiotic was administered within 1 hour of the procedure ANESTHESIA/SEDATION: Moderate (conscious) sedation was employed during this procedure. A total of Versed 0 mg and Fentanyl 50 mcg was administered intravenously. Moderate Sedation Time: 60 minutes. The patient's level of consciousness and vital signs were monitored continuously by radiology nursing throughout the procedure under my direct supervision. CONTRAST:  150 cc Isovue 300 FLUOROSCOPY TIME:  Fluoroscopy Time: 10 minutes 54 seconds (32 mGy). COMPLICATIONS: None immediate. PROCEDURE: Informed consent was obtained from the patient following explanation of the procedure, risks, benefits and alternatives. The patient understands, agrees and consents for the procedure. All questions were addressed. A time out was performed prior  to the initiation of the procedure. Maximal barrier sterile technique utilized including caps, mask, sterile gowns, sterile gloves, large sterile drape, hand hygiene, and Betadine prep. The right groin was prepped and draped in a  sterile fashion. Under sonographic guidance, a micropuncture needle was advanced towards the right common femoral artery and removed over a 018 wire. The wire was noted to traverse into the venous system. A 20 cm 5 French PICC was then advanced over the wire into the central venous system, was flushed, then sewn in place. This will function as a venous catheter. A second attempt was made. Under sonographic guidance, a micropuncture needle was advanced into the right common femoral artery and removed over a 018 wire which was up sized to a Bentson. A 5 French sheath was inserted. Lateral abdominal aortography was performed The pigtail was exchanged for a cobra 2 catheter. This was advanced over a Bentson wire into the SMA and angiography was performed. The Cobra catheter was exchanged for a 5 [place] Sos Omni catheter. The IMA was selected in the angiography was performed in the left upper quadrant and over the pelvis. The Sos Omni catheter was advanced into the aorta, was straightened, then was retracted into the right external iliac artery. Right femoral angiography was performed. The catheter was removed. The sheath was then flushed and sewn in place. A saline pressure bag was instituted. The INR checked during the procedure was 2.3. FINDINGS: Initial imaging demonstrates placement of a right common femoral vein PICC line with its tip in the right common iliac vein Lateral abdominal aortography demonstrates patency of the celiac and SMA Injection of the SMA demonstrates no contrast extravasation or source of active gastrointestinal bleeding. Injection of the IMA demonstrates no contrast extravasation or source of active gastrointestinal bleeding. Femoral angiography demonstrates sheath placement within the common femoral artery. IMPRESSION: Successful right common femoral vein PICC line. Successful mesenteric angiogram for gastrointestinal bleeding. The SMA and IMA were selected and injected with contrast. There  was no source of active contrast extravasation to suggest gastrointestinal bleeding. Electronically Signed   By: Jolaine Click M.D.   On: 08/06/2016 14:50   Ir US Guide Vasc Access Right  Result Date: 08/03/2016 INDICATION: Gastrointestinal bleeding EXAM: SELECTIVE VISCERAL ARTERIOGRAPHY; IR RIGHT FLOURO GUIDE CV LINE; IR ULTRASOUND GUIDANCE VASC ACCESS RIGHT MEDICATIONS: None. The antibiotic was administered within 1 hour of the procedure ANESTHESIA/SEDATION: Moderate (conscious) sedation was employed during this procedure. A total of Versed 0 mg and Fentanyl 50 mcg was administered intravenously. Moderate Sedation Time: 60 minutes. The patient's level of consciousness and vital signs were monitored continuously by radiology nursing throughout the procedure under my direct supervision. CONTRAST:  150 cc Isovue 300 FLUOROSCOPY TIME:  Fluoroscopy Time: 10 minutes 54 seconds (32 mGy). COMPLICATIONS: None immediate. PROCEDURE: Informed consent was obtained from the patient following explanation of the procedure, risks, benefits and alternatives. The patient understands, agrees and consents for the procedure. All questions were addressed. A time out was performed prior to the initiation of the procedure. Maximal barrier sterile technique utilized including caps, mask, sterile gowns, sterile gloves, large sterile drape, hand hygiene, and Betadine prep. The right groin was prepped and draped in a sterile fashion. Under sonographic guidance, a micropuncture needle was advanced towards the right common femoral artery and removed over a 018 wire. The wire was noted to traverse into the venous system. A 20 cm 5 French PICC was then advanced over the wire into the central venous system, was flushed,  then sewn in place. This will function as a venous catheter. A second attempt was made. Under sonographic guidance, a micropuncture needle was advanced into the right common femoral artery and removed over a 018 wire which was  up sized to a Bentson. A 5 French sheath was inserted. Lateral abdominal aortography was performed The pigtail was exchanged for a cobra 2 catheter. This was advanced over a Bentson wire into the SMA and angiography was performed. The Cobra catheter was exchanged for a 5 [place] Sos Omni catheter. The IMA was selected in the angiography was performed in the left upper quadrant and over the pelvis. The Sos Omni catheter was advanced into the aorta, was straightened, then was retracted into the right external iliac artery. Right femoral angiography was performed. The catheter was removed. The sheath was then flushed and sewn in place. A saline pressure bag was instituted. The INR checked during the procedure was 2.3. FINDINGS: Initial imaging demonstrates placement of a right common femoral vein PICC line with its tip in the right common iliac vein Lateral abdominal aortography demonstrates patency of the celiac and SMA Injection of the SMA demonstrates no contrast extravasation or source of active gastrointestinal bleeding. Injection of the IMA demonstrates no contrast extravasation or source of active gastrointestinal bleeding. Femoral angiography demonstrates sheath placement within the common femoral artery. IMPRESSION: Successful right common femoral vein PICC line. Successful mesenteric angiogram for gastrointestinal bleeding. The SMA and IMA were selected and injected with contrast. There was no source of active contrast extravasation to suggest gastrointestinal bleeding. Electronically Signed   By: Jolaine Click M.D.   On: 09/02/2016 14:50   Ir US Guide Vasc Access Right  Result Date: Sep 02, 2016 INDICATION: Gastrointestinal bleeding EXAM: SELECTIVE VISCERAL ARTERIOGRAPHY; IR RIGHT FLOURO GUIDE CV LINE; IR ULTRASOUND GUIDANCE VASC ACCESS RIGHT MEDICATIONS: None. The antibiotic was administered within 1 hour of the procedure ANESTHESIA/SEDATION: Moderate (conscious) sedation was employed during this procedure.  A total of Versed 0 mg and Fentanyl 50 mcg was administered intravenously. Moderate Sedation Time: 60 minutes. The patient's level of consciousness and vital signs were monitored continuously by radiology nursing throughout the procedure under my direct supervision. CONTRAST:  150 cc Isovue 300 FLUOROSCOPY TIME:  Fluoroscopy Time: 10 minutes 54 seconds (32 mGy). COMPLICATIONS: None immediate. PROCEDURE: Informed consent was obtained from the patient following explanation of the procedure, risks, benefits and alternatives. The patient understands, agrees and consents for the procedure. All questions were addressed. A time out was performed prior to the initiation of the procedure. Maximal barrier sterile technique utilized including caps, mask, sterile gowns, sterile gloves, large sterile drape, hand hygiene, and Betadine prep. The right groin was prepped and draped in a sterile fashion. Under sonographic guidance, a micropuncture needle was advanced towards the right common femoral artery and removed over a 018 wire. The wire was noted to traverse into the venous system. A 20 cm 5 French PICC was then advanced over the wire into the central venous system, was flushed, then sewn in place. This will function as a venous catheter. A second attempt was made. Under sonographic guidance, a micropuncture needle was advanced into the right common femoral artery and removed over a 018 wire which was up sized to a Bentson. A 5 French sheath was inserted. Lateral abdominal aortography was performed The pigtail was exchanged for a cobra 2 catheter. This was advanced over a Bentson wire into the SMA and angiography was performed. The Cobra catheter was exchanged for a 5 [place]  Sos Omni catheter. The IMA was selected in the angiography was performed in the left upper quadrant and over the pelvis. The Sos Omni catheter was advanced into the aorta, was straightened, then was retracted into the right external iliac artery. Right  femoral angiography was performed. The catheter was removed. The sheath was then flushed and sewn in place. A saline pressure bag was instituted. The INR checked during the procedure was 2.3. FINDINGS: Initial imaging demonstrates placement of a right common femoral vein PICC line with its tip in the right common iliac vein Lateral abdominal aortography demonstrates patency of the celiac and SMA Injection of the SMA demonstrates no contrast extravasation or source of active gastrointestinal bleeding. Injection of the IMA demonstrates no contrast extravasation or source of active gastrointestinal bleeding. Femoral angiography demonstrates sheath placement within the common femoral artery. IMPRESSION: Successful right common femoral vein PICC line. Successful mesenteric angiogram for gastrointestinal bleeding. The SMA and IMA were selected and injected with contrast. There was no source of active contrast extravasation to suggest gastrointestinal bleeding. Electronically Signed   By: Jolaine Click M.D.   On: 15-Aug-2016 14:50    Labs:  CBC:  Recent Labs  15-Aug-2016 0131 08/15/16 0926 2016/08/15 2155 08/04/2016 0629  WBC 16.7* 15.0* 13.5* 14.4*  HGB 6.8* 6.4* 5.7* 6.9*  HCT 19.7* 19.0* 16.7* 20.1*  PLT 62* 50* 58* 43*    COAGS:  Recent Labs  08/15/2016 0926 2016-08-15 1340 08/15/16 2155 08/14/2016 0629  INR 2.54 2.36 2.82 3.47    BMP:  Recent Labs  08/07/16 0730 08/07/16 1711 2016-08-15 0512 08/16/2016 0629  NA 141 137 137 135  K 5.0 4.8 4.8 5.1  CL 103 101 97* 95*  CO2 29 26 28 28   GLUCOSE 102* 103* 120* 108*  BUN 90* 93* 92* 104*  CALCIUM 8.8* 8.8* 9.1 9.0  CREATININE 3.60* 3.94* 3.97* 5.01*  GFRNONAA 13* 11* 11* 8*  GFRAA 15* 13* 13* 10*    LIVER FUNCTION TESTS:  Recent Labs  08/03/2016 0220 08/07/16 0730 08/07/16 1711 07/29/2016 0629  BILITOT 16.0* 14.9* 15.3* 15.3*  AST 147* 111* 119* 152*  ALT 58* 42 46 50  ALKPHOS 75 57 56 46  PROT 6.8 4.6* 4.6* 4.2*  ALBUMIN 2.0* 1.5*  1.5* 1.5*    Assessment and Plan: 1. S/p negative mesenteric angio, 8/12 -patient continues to have multiple bloody BMs.  Her INR is over 3.  Will defer to primary service regarding correcting this. -her kidney function has worsened today after her contrast load yesterday, which was expected and discussed with the patient and her family prior to the procedure. -her sheath should remain in place at this time due to her elevated INR. -we will continue to follow.  Electronically Signed: Letha Cape 08/11/2016, 11:28 AM   I spent a total of 15 Minutes at the the patient's bedside AND on the patient's hospital floor or unit, greater than 50% of which was counseling/coordinating care for GI bleed

## 2016-08-28 NOTE — Progress Notes (Signed)
1 Day Post-Op  Subjective: 24 yof with cirrhosis and gi bleed.   She has nm scan that does not localize this at all, angiogram negative yesterday, egd negative, flex sig with blood in what was seen. She continues to be coagulopathic and bleeding.  Not localized. Called to discuss surgical options. She doesn't really complain of much, mental status not great, only surgery is hysterectomy  Objective: Vital signs in last 24 hours: Temp:  [97 F (36.1 C)-98.4 F (36.9 C)] 97.8 F (36.6 C) (08/13 0747) Pulse Rate:  [93-110] 100 (08/13 0700) Resp:  [0-24] 2 (08/13 0600) BP: (80-147)/(32-88) 115/44 (08/13 0700) SpO2:  [95 %-100 %] 98 % (08/13 0700) Arterial Line BP: (122-149)/(41-57) 123/41 (08/13 0500) Weight:  [174.2 kg (384 lb)] 174.2 kg (384 lb) (08/13 0500) Last BM Date: 08/10/2016  Intake/Output from previous day: 08/12 0701 - 08/13 0700 In: 5075.5 [I.V.:1553.3; Blood:2522.2] Out: 520 [Urine:520] Intake/Output this shift: Total I/O In: 160.2 [I.V.:160.2] Out: 30 [Urine:30]  GI: obese, low midline nontender  Lab Results:   Recent Labs  08/17/2016 2155 08-30-16 0629  WBC 13.5* 14.4*  HGB 5.7* 6.9*  HCT 16.7* 20.1*  PLT 58* 43*   BMET  Recent Labs  08/04/2016 0512 2016-08-30 0629  NA 137 135  K 4.8 5.1  CL 97* 95*  CO2 28 28  GLUCOSE 120* 108*  BUN 92* 104*  CREATININE 3.97* 5.01*  CALCIUM 9.1 9.0   PT/INR  Recent Labs  08/01/2016 2155 August 30, 2016 0629  LABPROT 30.2* 35.7*  INR 2.82 3.47   ABG No results for input(s): PHART, HCO3 in the last 72 hours.  Invalid input(s): PCO2, PO2  Studies/Results: Ir Angiogram Visceral Selective  Result Date: 08/14/2016 INDICATION: Gastrointestinal bleeding EXAM: SELECTIVE VISCERAL ARTERIOGRAPHY; IR RIGHT FLOURO GUIDE CV LINE; IR ULTRASOUND GUIDANCE VASC ACCESS RIGHT MEDICATIONS: None. The antibiotic was administered within 1 hour of the procedure ANESTHESIA/SEDATION: Moderate (conscious) sedation was employed during this  procedure. A total of Versed 0 mg and Fentanyl 50 mcg was administered intravenously. Moderate Sedation Time: 60 minutes. The patient's level of consciousness and vital signs were monitored continuously by radiology nursing throughout the procedure under my direct supervision. CONTRAST:  150 cc Isovue 300 FLUOROSCOPY TIME:  Fluoroscopy Time: 10 minutes 54 seconds (32 mGy). COMPLICATIONS: None immediate. PROCEDURE: Informed consent was obtained from the patient following explanation of the procedure, risks, benefits and alternatives. The patient understands, agrees and consents for the procedure. All questions were addressed. A time out was performed prior to the initiation of the procedure. Maximal barrier sterile technique utilized including caps, mask, sterile gowns, sterile gloves, large sterile drape, hand hygiene, and Betadine prep. The right groin was prepped and draped in a sterile fashion. Under sonographic guidance, a micropuncture needle was advanced towards the right common femoral artery and removed over a 018 wire. The wire was noted to traverse into the venous system. A 20 cm 5 French PICC was then advanced over the wire into the central venous system, was flushed, then sewn in place. This will function as a venous catheter. A second attempt was made. Under sonographic guidance, a micropuncture needle was advanced into the right common femoral artery and removed over a 018 wire which was up sized to a Bentson. A 5 French sheath was inserted. Lateral abdominal aortography was performed The pigtail was exchanged for a cobra 2 catheter. This was advanced over a Bentson wire into the SMA and angiography was performed. The Cobra catheter was exchanged for a 5  [place] Sos Omni catheter. The IMA was selected in the angiography was performed in the left upper quadrant and over the pelvis. The Sos Omni catheter was advanced into the aorta, was straightened, then was retracted into the right external iliac  artery. Right femoral angiography was performed. The catheter was removed. The sheath was then flushed and sewn in place. A saline pressure bag was instituted. The INR checked during the procedure was 2.3. FINDINGS: Initial imaging demonstrates placement of a right common femoral vein PICC line with its tip in the right common iliac vein Lateral abdominal aortography demonstrates patency of the celiac and SMA Injection of the SMA demonstrates no contrast extravasation or source of active gastrointestinal bleeding. Injection of the IMA demonstrates no contrast extravasation or source of active gastrointestinal bleeding. Femoral angiography demonstrates sheath placement within the common femoral artery. IMPRESSION: Successful right common femoral vein PICC line. Successful mesenteric angiogram for gastrointestinal bleeding. The SMA and IMA were selected and injected with contrast. There was no source of active contrast extravasation to suggest gastrointestinal bleeding. Electronically Signed   By: Jolaine Click M.D.   On: 09-07-2016 14:50   Ir Angiogram Visceral Selective  Result Date: September 07, 2016 INDICATION: Gastrointestinal bleeding EXAM: SELECTIVE VISCERAL ARTERIOGRAPHY; IR RIGHT FLOURO GUIDE CV LINE; IR ULTRASOUND GUIDANCE VASC ACCESS RIGHT MEDICATIONS: None. The antibiotic was administered within 1 hour of the procedure ANESTHESIA/SEDATION: Moderate (conscious) sedation was employed during this procedure. A total of Versed 0 mg and Fentanyl 50 mcg was administered intravenously. Moderate Sedation Time: 60 minutes. The patient's level of consciousness and vital signs were monitored continuously by radiology nursing throughout the procedure under my direct supervision. CONTRAST:  150 cc Isovue 300 FLUOROSCOPY TIME:  Fluoroscopy Time: 10 minutes 54 seconds (32 mGy). COMPLICATIONS: None immediate. PROCEDURE: Informed consent was obtained from the patient following explanation of the procedure, risks, benefits and  alternatives. The patient understands, agrees and consents for the procedure. All questions were addressed. A time out was performed prior to the initiation of the procedure. Maximal barrier sterile technique utilized including caps, mask, sterile gowns, sterile gloves, large sterile drape, hand hygiene, and Betadine prep. The right groin was prepped and draped in a sterile fashion. Under sonographic guidance, a micropuncture needle was advanced towards the right common femoral artery and removed over a 018 wire. The wire was noted to traverse into the venous system. A 20 cm 5 French PICC was then advanced over the wire into the central venous system, was flushed, then sewn in place. This will function as a venous catheter. A second attempt was made. Under sonographic guidance, a micropuncture needle was advanced into the right common femoral artery and removed over a 018 wire which was up sized to a Bentson. A 5 French sheath was inserted. Lateral abdominal aortography was performed The pigtail was exchanged for a cobra 2 catheter. This was advanced over a Bentson wire into the SMA and angiography was performed. The Cobra catheter was exchanged for a 5 [place] Sos Omni catheter. The IMA was selected in the angiography was performed in the left upper quadrant and over the pelvis. The Sos Omni catheter was advanced into the aorta, was straightened, then was retracted into the right external iliac artery. Right femoral angiography was performed. The catheter was removed. The sheath was then flushed and sewn in place. A saline pressure bag was instituted. The INR checked during the procedure was 2.3. FINDINGS: Initial imaging demonstrates placement of a right common femoral vein PICC line with  its tip in the right common iliac vein Lateral abdominal aortography demonstrates patency of the celiac and SMA Injection of the SMA demonstrates no contrast extravasation or source of active gastrointestinal bleeding. Injection  of the IMA demonstrates no contrast extravasation or source of active gastrointestinal bleeding. Femoral angiography demonstrates sheath placement within the common femoral artery. IMPRESSION: Successful right common femoral vein PICC line. Successful mesenteric angiogram for gastrointestinal bleeding. The SMA and IMA were selected and injected with contrast. There was no source of active contrast extravasation to suggest gastrointestinal bleeding. Electronically Signed   By: Jolaine Click M.D.   On: 07/29/2016 14:50   Ir Fluoro Guide Cv Line Right  Result Date: 08/16/2016 INDICATION: Gastrointestinal bleeding EXAM: SELECTIVE VISCERAL ARTERIOGRAPHY; IR RIGHT FLOURO GUIDE CV LINE; IR ULTRASOUND GUIDANCE VASC ACCESS RIGHT MEDICATIONS: None. The antibiotic was administered within 1 hour of the procedure ANESTHESIA/SEDATION: Moderate (conscious) sedation was employed during this procedure. A total of Versed 0 mg and Fentanyl 50 mcg was administered intravenously. Moderate Sedation Time: 60 minutes. The patient's level of consciousness and vital signs were monitored continuously by radiology nursing throughout the procedure under my direct supervision. CONTRAST:  150 cc Isovue 300 FLUOROSCOPY TIME:  Fluoroscopy Time: 10 minutes 54 seconds (32 mGy). COMPLICATIONS: None immediate. PROCEDURE: Informed consent was obtained from the patient following explanation of the procedure, risks, benefits and alternatives. The patient understands, agrees and consents for the procedure. All questions were addressed. A time out was performed prior to the initiation of the procedure. Maximal barrier sterile technique utilized including caps, mask, sterile gowns, sterile gloves, large sterile drape, hand hygiene, and Betadine prep. The right groin was prepped and draped in a sterile fashion. Under sonographic guidance, a micropuncture needle was advanced towards the right common femoral artery and removed over a 018 wire. The wire was  noted to traverse into the venous system. A 20 cm 5 French PICC was then advanced over the wire into the central venous system, was flushed, then sewn in place. This will function as a venous catheter. A second attempt was made. Under sonographic guidance, a micropuncture needle was advanced into the right common femoral artery and removed over a 018 wire which was up sized to a Bentson. A 5 French sheath was inserted. Lateral abdominal aortography was performed The pigtail was exchanged for a cobra 2 catheter. This was advanced over a Bentson wire into the SMA and angiography was performed. The Cobra catheter was exchanged for a 5 [place] Sos Omni catheter. The IMA was selected in the angiography was performed in the left upper quadrant and over the pelvis. The Sos Omni catheter was advanced into the aorta, was straightened, then was retracted into the right external iliac artery. Right femoral angiography was performed. The catheter was removed. The sheath was then flushed and sewn in place. A saline pressure bag was instituted. The INR checked during the procedure was 2.3. FINDINGS: Initial imaging demonstrates placement of a right common femoral vein PICC line with its tip in the right common iliac vein Lateral abdominal aortography demonstrates patency of the celiac and SMA Injection of the SMA demonstrates no contrast extravasation or source of active gastrointestinal bleeding. Injection of the IMA demonstrates no contrast extravasation or source of active gastrointestinal bleeding. Femoral angiography demonstrates sheath placement within the common femoral artery. IMPRESSION: Successful right common femoral vein PICC line. Successful mesenteric angiogram for gastrointestinal bleeding. The SMA and IMA were selected and injected with contrast. There was no source of active  contrast extravasation to suggest gastrointestinal bleeding. Electronically Signed   By: Jolaine ClickArthur  Hoss M.D.   On: Jul 17, 2016 14:50   Ir [place]  Guide Vasc Access Right  Result Date: 10/06/2016 INDICATION: Gastrointestinal bleeding EXAM: SELECTIVE VISCERAL ARTERIOGRAPHY; IR RIGHT FLOURO GUIDE CV LINE; IR ULTRASOUND GUIDANCE VASC ACCESS RIGHT MEDICATIONS: None. The antibiotic was administered within 1 hour of the procedure ANESTHESIA/SEDATION: Moderate (conscious) sedation was employed during this procedure. A total of Versed 0 mg and Fentanyl 50 mcg was administered intravenously. Moderate Sedation Time: 60 minutes. The patient's level of consciousness and vital signs were monitored continuously by radiology nursing throughout the procedure under my direct supervision. CONTRAST:  150 cc Isovue 300 FLUOROSCOPY TIME:  Fluoroscopy Time: 10 minutes 54 seconds (32 mGy). COMPLICATIONS: None immediate. PROCEDURE: Informed consent was obtained from the patient following explanation of the procedure, risks, benefits and alternatives. The patient understands, agrees and consents for the procedure. All questions were addressed. A time out was performed prior to the initiation of the procedure. Maximal barrier sterile technique utilized including caps, mask, sterile gowns, sterile gloves, large sterile drape, hand hygiene, and Betadine prep. The right groin was prepped and draped in a sterile fashion. Under sonographic guidance, a micropuncture needle was advanced towards the right common femoral artery and removed over a 018 wire. The wire was noted to traverse into the venous system. A 20 cm 5 French PICC was then advanced over the wire into the central venous system, was flushed, then sewn in place. This will function as a venous catheter. A second attempt was made. Under sonographic guidance, a micropuncture needle was advanced into the right common femoral artery and removed over a 018 wire which was up sized to a Bentson. A 5 French sheath was inserted. Lateral abdominal aortography was performed The pigtail was exchanged for a cobra 2 catheter. This was  advanced over a Bentson wire into the SMA and angiography was performed. The Cobra catheter was exchanged for a 5 [place] Sos Omni catheter. The IMA was selected in the angiography was performed in the left upper quadrant and over the pelvis. The Sos Omni catheter was advanced into the aorta, was straightened, then was retracted into the right external iliac artery. Right femoral angiography was performed. The catheter was removed. The sheath was then flushed and sewn in place. A saline pressure bag was instituted. The INR checked during the procedure was 2.3. FINDINGS: Initial imaging demonstrates placement of a right common femoral vein PICC line with its tip in the right common iliac vein Lateral abdominal aortography demonstrates patency of the celiac and SMA Injection of the SMA demonstrates no contrast extravasation or source of active gastrointestinal bleeding. Injection of the IMA demonstrates no contrast extravasation or source of active gastrointestinal bleeding. Femoral angiography demonstrates sheath placement within the common femoral artery. IMPRESSION: Successful right common femoral vein PICC line. Successful mesenteric angiogram for gastrointestinal bleeding. The SMA and IMA were selected and injected with contrast. There was no source of active contrast extravasation to suggest gastrointestinal bleeding. Electronically Signed   By: Jolaine ClickArthur  Hoss M.D.   On: Jul 17, 2016 14:50   Ir Koreas Guide Vasc Access Right  Result Date: 10/06/2016 INDICATION: Gastrointestinal bleeding EXAM: SELECTIVE VISCERAL ARTERIOGRAPHY; IR RIGHT FLOURO GUIDE CV LINE; IR ULTRASOUND GUIDANCE VASC ACCESS RIGHT MEDICATIONS: None. The antibiotic was administered within 1 hour of the procedure ANESTHESIA/SEDATION: Moderate (conscious) sedation was employed during this procedure. A total of Versed 0 mg and Fentanyl 50 mcg was administered intravenously. Moderate  Sedation Time: 60 minutes. The patient's level of consciousness and vital  signs were monitored continuously by radiology nursing throughout the procedure under my direct supervision. CONTRAST:  150 cc Isovue 300 FLUOROSCOPY TIME:  Fluoroscopy Time: 10 minutes 54 seconds (32 mGy). COMPLICATIONS: None immediate. PROCEDURE: Informed consent was obtained from the patient following explanation of the procedure, risks, benefits and alternatives. The patient understands, agrees and consents for the procedure. All questions were addressed. A time out was performed prior to the initiation of the procedure. Maximal barrier sterile technique utilized including caps, mask, sterile gowns, sterile gloves, large sterile drape, hand hygiene, and Betadine prep. The right groin was prepped and draped in a sterile fashion. Under sonographic guidance, a micropuncture needle was advanced towards the right common femoral artery and removed over a 018 wire. The wire was noted to traverse into the venous system. A 20 cm 5 French PICC was then advanced over the wire into the central venous system, was flushed, then sewn in place. This will function as a venous catheter. A second attempt was made. Under sonographic guidance, a micropuncture needle was advanced into the right common femoral artery and removed over a 018 wire which was up sized to a Bentson. A 5 French sheath was inserted. Lateral abdominal aortography was performed The pigtail was exchanged for a cobra 2 catheter. This was advanced over a Bentson wire into the SMA and angiography was performed. The Cobra catheter was exchanged for a 5 [place] Sos Omni catheter. The IMA was selected in the angiography was performed in the left upper quadrant and over the pelvis. The Sos Omni catheter was advanced into the aorta, was straightened, then was retracted into the right external iliac artery. Right femoral angiography was performed. The catheter was removed. The sheath was then flushed and sewn in place. A saline pressure bag was instituted. The INR checked  during the procedure was 2.3. FINDINGS: Initial imaging demonstrates placement of a right common femoral vein PICC line with its tip in the right common iliac vein Lateral abdominal aortography demonstrates patency of the celiac and SMA Injection of the SMA demonstrates no contrast extravasation or source of active gastrointestinal bleeding. Injection of the IMA demonstrates no contrast extravasation or source of active gastrointestinal bleeding. Femoral angiography demonstrates sheath placement within the common femoral artery. IMPRESSION: Successful right common femoral vein PICC line. Successful mesenteric angiogram for gastrointestinal bleeding. The SMA and IMA were selected and injected with contrast. There was no source of active contrast extravasation to suggest gastrointestinal bleeding. Electronically Signed   By: Jolaine Click M.D.   On: 08/03/2016 14:50    Anti-infectives: Anti-infectives    Start     Dose/Rate Route Frequency Ordered Stop   08/03/16 2200  cefTRIAXone (ROCEPHIN) 1 g in dextrose 5 % 50 mL IVPB     1 g 100 mL/hr over 30 Minutes Intravenous Every 24 hours 08/03/16 1036 08/10/16 2159   08/16/2016 2245  cefTRIAXone (ROCEPHIN) 1 g in dextrose 5 % 50 mL IVPB     1 g 100 mL/hr over 30 Minutes Intravenous  Once 07/29/2016 2236 08/03/16 0028      Assessment/Plan: GI bleed  This is not localized and I dont know there is any surgery to fix this plus her morbidity with liver disease is very high.  No role for any consideration of surgery at this point. Would recommended continued endoscopic evaluation and attempts to reverse coagulopathy  [facility] 08/22/2016

## 2016-08-28 NOTE — Progress Notes (Signed)
[facility]  Diane AlexandersJeane Cook 60 y.o. 1956-01-22   Subjective: Patient continues to have GI bleed. D/W Nursing staff and ICU MD. Multiple Bowel movements with BRBPR. S/P Angiography yesterday - negative.    Objective: Vital signs in last 24 hours: Vitals:   07/31/2016 0700 08/24/2016 0747  BP: (!) 115/44   Pulse: 100   Resp:    Temp:  97.8 F (36.6 C)    Physical Exam:  General:  Alert, cooperative,  obese  Head:  Normocephalic, without obvious abnormality, atraumatic  Eyes:  EOM's intact,   Lungs:   Anterior exam only. Diminished breathsounds   Heart:  Regular rate and rhythm, S1, S2 normal  Abdomen:   Soft, non-tender, bowel sounds active all four quadrants,  no masses,   Extremities: Extremities normal, atraumatic, no  edema  Pulses: 2+ and symmetric    Lab Results:  Recent Labs  07/29/2016 0512 08/15/2016 0629  NA 137 135  K 4.8 5.1  CL 97* 95*  CO2 28 28  GLUCOSE 120* 108*  BUN 92* 104*  CREATININE 3.97* 5.01*  CALCIUM 9.1 9.0  MG 1.7 1.9  PHOS 6.4* 7.0*    Recent Labs  08/07/16 1711 08/15/2016 0629  AST 119* 152*  ALT 46 50  ALKPHOS 56 46  BILITOT 15.3* 15.3*  PROT 4.6* 4.2*  ALBUMIN 1.5* 1.5*    Recent Labs  08/18/2016 2155 08/23/2016 0629  WBC 13.5* 14.4*  NEUTROABS 8.5* 8.9*  HGB 5.7* 6.9*  HCT 16.7* 20.1*  MCV 93.8 92.2  PLT 58* 43*    Recent Labs  08/24/2016 2155 08/26/2016 0629  LABPROT 30.2* 35.7*  INR 2.82 3.47      Assessment/Plan:  #1. GI bleed with recent endoscopy showing duodenitis with erosions, and no evidence of esophageal varices. Flex sig showed Bright blood covering colon mucosa. S/P Angiography yesterday as well There was no source of active contrast extravasation to suggest gastrointestinal bleeding  #2. Alcoholic/hepatitis C cirrhosis of the liver  #3. Coagulopathy secondary to #1  #4. Thrombocytopenia secondary to #1, as well as bone marrow suppression from continued use of alcohol  #5.  Hepatic encephalopathy : resolved.   #6. Jaundice secondary to #1   PLAN ------ - negative IR angiography for bleeding. Continues to have BRBPR.  - Difficult situation given cirrhosis and other co morbidities.  - INR is also elevated at 3.47 despite giving FFP yesterday.  - Supportive care for now.  - 2 units of FFP. Vit K  - May consider colonoscopy once coagulopathy is reversed.  - Recheck INR . CBC in AM  - Transfuse as needed    Hans Rusher 08/10/2016, 10:27 AM  Pager [phone]  If no answer or after 5 PM call [phone]

## 2016-08-28 NOTE — Progress Notes (Signed)
Witnessed bleeding coming from arterial line. Patient has pulled at the line causing the catheter to pull out some. Radiology and surgery notified. Received order per Radiologist to keep pressure to the site. New dressing applied and sandbag placed.

## 2016-08-28 NOTE — Progress Notes (Signed)
2 units of FFP to be given per GI MD. Do not give 1 additional unit ordered.

## 2016-08-28 NOTE — Transfer of Care (Signed)
Immediate Anesthesia Transfer of Care Note  Patient: Diane Cook  Procedure(s) Performed: * No procedures listed *  Patient Location: ICU  Anesthesia Type:General  Level of Consciousness: unresponsive  Airway & Oxygen Therapy: Patient remains intubated per anesthesia plan and Patient placed on Ventilator (see vital sign flow sheet for setting)  Post-op Assessment: Report given to RN  Post vital signs: Reviewed and unstable  Last Vitals:  Vitals:   08/25/2016 1450 08/04/2016 1623  BP: (!) 107/51   Pulse: 97   Resp: (!) 25   Temp: 36.4 C 36.3 C    Last Pain:  Vitals:   08/01/2016 1623  TempSrc: Oral  PainSc:          Complications: No apparent anesthesia complications

## 2016-08-28 DEATH — deceased

## 2016-09-04 ENCOUNTER — Ambulatory Visit: Payer: Medicaid Other | Admitting: Family Medicine

## 2017-03-29 ENCOUNTER — Encounter (HOSPITAL_COMMUNITY): Payer: Self-pay | Admitting: Anesthesiology

## 2017-11-29 IMAGING — NM NM GI BLOOD LOSS
2 series · 12 of 12 positions shown · non-contrast
Comparison: None.

CLINICAL DATA: Evaluate for GI bleed.

EXAM:
NUCLEAR MEDICINE GASTROINTESTINAL BLEEDING SCAN
TECHNIQUE: Sequential abdominal images were obtained following intravenous
administration of Wc-ZZm labeled red blood cells.
RADIOPHARMACEUTICALS:  25.8 mCi Wc-ZZm in-vitro labeled red cells.

[gi gi bleed · 5.01mm/px · 6 of 60 frames shown (1 of 2)]
[frame 6/60]
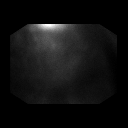
[frame 16/60]
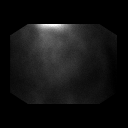
[frame 26/60]
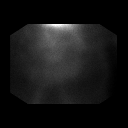
[frame 36/60]
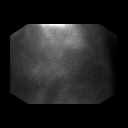
[frame 46/60]
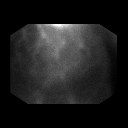
[frame 56/60]
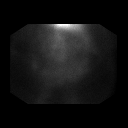

[gi gi bleed · 5.01mm/px · 6 of 60 frames shown (2 of 2)]
[frame 6/60]
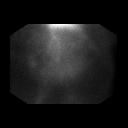
[frame 16/60]
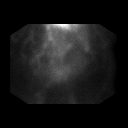
[frame 26/60]
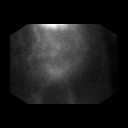
[frame 36/60]
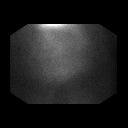
[frame 46/60]
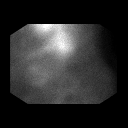
[frame 56/60]
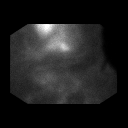

[12 of 12 positions shown; findings below may reference images not displayed]

FINDINGS: Markedly diminished exam detail secondary to excessive motion
artifact. Patient was not able to remain still during the
examination.

Within this limitation there is evidence to suggest intraluminal
radiotracer accumulation within the bowel loops. Compatible with the
clinical history of GI bleed. On the first hour the radiotracer
appears to accumulate in moved in the distribution of the left lower
quadrant of the abdomen. On the second hour radiotracer can be seen
crossing the midline from left to right.
IMPRESSION: 1. Markedly diminished exam detail secondary to excessive motion
artifact.
2. Based on the images acquired there is evidence of intraluminal
bowel accumulation which may reflect a small or large bowel bleed. I
cannot confidently say whether the activity is originating from
small bowel or large bowel loops or within the right side of abdomen
or left abdomen.
